# Patient Record
Sex: Female | Born: 1945 | Race: Black or African American | Hispanic: No | Smoking: Former smoker
Health system: Southern US, Community
[De-identification: ages and names within clinical notes are randomized; demographics above are authoritative.]

## PROBLEM LIST (undated history)

## (undated) DIAGNOSIS — Z9981 Dependence on supplemental oxygen: Secondary | ICD-10-CM

## (undated) DIAGNOSIS — R0600 Dyspnea, unspecified: Secondary | ICD-10-CM

## (undated) DIAGNOSIS — F419 Anxiety disorder, unspecified: Secondary | ICD-10-CM

## (undated) DIAGNOSIS — I10 Essential (primary) hypertension: Secondary | ICD-10-CM

## (undated) DIAGNOSIS — E785 Hyperlipidemia, unspecified: Secondary | ICD-10-CM

## (undated) DIAGNOSIS — J189 Pneumonia, unspecified organism: Secondary | ICD-10-CM

## (undated) DIAGNOSIS — M1990 Unspecified osteoarthritis, unspecified site: Secondary | ICD-10-CM

## (undated) HISTORY — DX: Essential (primary) hypertension: I10

## (undated) HISTORY — PX: HERNIA REPAIR: SHX51

## (undated) HISTORY — DX: Dependence on supplemental oxygen: Z99.81

## (undated) HISTORY — DX: Hyperlipidemia, unspecified: E78.5

## (undated) HISTORY — PX: BACK SURGERY: SHX140

## (undated) HISTORY — DX: Pneumonia, unspecified organism: J18.9

## (undated) HISTORY — PX: TUBAL LIGATION: SHX77

## (undated) HISTORY — PX: CATARACT EXTRACTION, BILATERAL: SHX1313

---

## 2010-08-15 ENCOUNTER — Telehealth: Payer: Self-pay

## 2010-08-15 DIAGNOSIS — Z139 Encounter for screening, unspecified: Secondary | ICD-10-CM

## 2010-08-15 NOTE — Telephone Encounter (Signed)
HALF-LYTELY 

## 2010-08-15 NOTE — Telephone Encounter (Signed)
Gastroenterology Pre-Procedure Form  Request Date: 08/07/2010  Requesting Physician: [facility]     PATIENT INFORMATION:  Yolanda Bauer is a 65 y.o., female (DOB=1946/02/05).  PROCEDURE: Procedure(s) requested: colonoscopy Procedure Reason: screening for colon cancer  PATIENT REVIEW QUESTIONS: The patient reports the following:   1. Diabetes Melitis: no 2. Joint replacements in the past 12 months: no 3. Major health problems in the past 3 months: no 4. Has an artificial valve or MVP:no 5. Has been advised in past to take antibiotics in advance of a procedure like teeth cleaning: no}    MEDICATIONS & ALLERGIES:    Patient reports the following regarding taking any blood thinners:   Plavix? no Aspirin?yes  Coumadin?  no  Patient confirms/reports the following medications:  Current Outpatient Prescriptions  Medication Sig Dispense Refill  . Ascorbic Acid (VITAMIN C) 500 MG tablet Take 500 mg by mouth daily.        Marland Kitchen aspirin 81 MG tablet Take 81 mg by mouth daily.        . Coral Calcium 1000 (390 CA) MG TABS Take by mouth.        Marland Kitchen lisinopril-hydrochlorothiazide (PRINZIDE,ZESTORETIC) 10-12.5 MG per tablet Take 1 tablet by mouth daily.        . Multiple Vitamin (MULTIVITAMIN) tablet Take 1 tablet by mouth daily.          Patient confirms/reports the following allergies:  Allergies  Allergen Reactions  . Penicillins Hives    Patient is appropriate to schedule for requested procedure(s): yes  AUTHORIZATION INFORMATION Primary Insurance:   ID #: Group #: Pre-Cert / Auth required: Pre-Cert / Auth #:   Secondary Insurance  ID #: Group #: Pre-Cert / Auth required: Pre-Cert / Auth #:  Orders Placed This Encounter  Procedures  . Endoscopy, colon, diagnostic    Standing Status: Future     Number of Occurrences:      Standing Expiration Date: 08/15/2011    Order Specific Question:  Pre-op diagnosis    Answer:  Screening    Order Specific Question:  Pre-op  visit required?    Answer:  No [0]    SCHEDULE INFORMATION: Procedure has been scheduled as follows:  Date: 08/28/2010, Time: 9:15 AM  Location: [facility] Short Stay  This Gastroenterology Pre-Precedure Form is being routed to the following provider(s) for review: Dr. Jonette Eva

## 2010-08-16 NOTE — Telephone Encounter (Signed)
Rx and instructions mailed to pt.  

## 2010-08-28 ENCOUNTER — Encounter: Payer: Medicaid Other | Admitting: Gastroenterology

## 2010-08-28 ENCOUNTER — Ambulatory Visit (HOSPITAL_COMMUNITY)
Admission: RE | Admit: 2010-08-28 | Discharge: 2010-08-28 | Disposition: A | Discharge status: Home / Self Care | Payer: Medicaid Other | Source: Ambulatory Visit | Attending: Gastroenterology | Admitting: Gastroenterology

## 2010-08-28 DIAGNOSIS — Z1211 Encounter for screening for malignant neoplasm of colon: Secondary | ICD-10-CM

## 2010-08-28 DIAGNOSIS — K5730 Diverticulosis of large intestine without perforation or abscess without bleeding: Secondary | ICD-10-CM

## 2010-09-14 NOTE — Op Note (Signed)
  NAME:  Yolanda Bauer, Yolanda Bauer NO.:  [id number]  MEDICAL RECORD NO.:  [id number]           PATIENT TYPE:  O  LOCATION:  [facility]                          FACILITY:  [facility]  PHYSICIAN:  Jonette Eva, M.D.     DATE OF BIRTH:  24-Jun-1945  DATE OF PROCEDURE:  08/28/2010 DATE OF DISCHARGE:                              OPERATIVE REPORT   REFERRING PROVIDER:  [facility].  PROCEDURE:  Colonoscopy.  SURGEON:  Jonette Eva, MD  INDICATION FOR EXAMINATION:  Yolanda Bauer is a 65 year old female who presents for average risk colon cancer screening.  She did have a second- degree relative that had colon cancer.  FINDINGS: 1. Slightly tortuous colon.  Otherwise, no polyps, masses,     inflammatory changes or AVMs seen. 2. Rare sigmoid colon diverticula. 3. Normal retroflex view of the rectum.  RECOMMENDATIONS: 1. Screening colonoscopy in 10 years. 2. She should follow a high-fiber diet.  She is given a handout on     high-fiber diet and diverticulosis.  MEDICATIONS: 1. Demerol 50 mg IV. 2. Versed 3 mg IV.  PROCEDURE TECHNIQUE:  Physical exam was performed and informed consent was obtained from the patient explaining the benefits, risks, and alternatives to the procedure.  The patient was connected to the monitor and placed in left lateral position.  Continuous oxygen was provided by nasal cannula and IV medicine administered through an indwelling cannula.  After administration of sedation and rectal exam, the patient's rectum was intubated and the scope was advanced under direct visualization to the cecum. Scope was removed slowly by careful examine of the color, texture, anatomy, and integrity of the mucosa on the way out.  The patient was recovered in endoscopy and discharged home in satisfactory condition.     Jonette Eva, M.D.     SF/MEDQ  D:  08/28/2010  T:  08/28/2010  Job:  [id number]  cc:   [facility]  Department  Electronically Signed by Jonette Eva M.D. on 09/14/2010 05:05:51 PM

## 2014-07-29 DIAGNOSIS — Z1231 Encounter for screening mammogram for malignant neoplasm of breast: Secondary | ICD-10-CM | POA: Diagnosis not present

## 2014-08-25 DIAGNOSIS — Z720 Tobacco use: Secondary | ICD-10-CM | POA: Diagnosis not present

## 2014-08-25 DIAGNOSIS — I10 Essential (primary) hypertension: Secondary | ICD-10-CM | POA: Diagnosis not present

## 2014-08-25 DIAGNOSIS — J309 Allergic rhinitis, unspecified: Secondary | ICD-10-CM | POA: Diagnosis not present

## 2014-12-23 DIAGNOSIS — H9203 Otalgia, bilateral: Secondary | ICD-10-CM | POA: Diagnosis not present

## 2014-12-23 DIAGNOSIS — H6123 Impacted cerumen, bilateral: Secondary | ICD-10-CM | POA: Diagnosis not present

## 2015-01-04 DIAGNOSIS — H6123 Impacted cerumen, bilateral: Secondary | ICD-10-CM | POA: Diagnosis not present

## 2015-03-02 DIAGNOSIS — Z23 Encounter for immunization: Secondary | ICD-10-CM | POA: Diagnosis not present

## 2015-03-02 DIAGNOSIS — Z0000 Encounter for general adult medical examination without abnormal findings: Secondary | ICD-10-CM | POA: Diagnosis not present

## 2015-03-02 DIAGNOSIS — Z1389 Encounter for screening for other disorder: Secondary | ICD-10-CM | POA: Diagnosis not present

## 2015-03-16 DIAGNOSIS — Z0100 Encounter for examination of eyes and vision without abnormal findings: Secondary | ICD-10-CM | POA: Diagnosis not present

## 2015-03-16 DIAGNOSIS — H2510 Age-related nuclear cataract, unspecified eye: Secondary | ICD-10-CM | POA: Diagnosis not present

## 2015-03-16 DIAGNOSIS — H5210 Myopia, unspecified eye: Secondary | ICD-10-CM | POA: Diagnosis not present

## 2015-03-16 DIAGNOSIS — H524 Presbyopia: Secondary | ICD-10-CM | POA: Diagnosis not present

## 2015-08-04 DIAGNOSIS — Z1231 Encounter for screening mammogram for malignant neoplasm of breast: Secondary | ICD-10-CM | POA: Diagnosis not present

## 2015-08-17 DIAGNOSIS — J209 Acute bronchitis, unspecified: Secondary | ICD-10-CM | POA: Diagnosis not present

## 2015-08-17 DIAGNOSIS — J069 Acute upper respiratory infection, unspecified: Secondary | ICD-10-CM | POA: Diagnosis not present

## 2015-08-26 DIAGNOSIS — H25813 Combined forms of age-related cataract, bilateral: Secondary | ICD-10-CM | POA: Diagnosis not present

## 2015-12-13 DIAGNOSIS — H02839 Dermatochalasis of unspecified eye, unspecified eyelid: Secondary | ICD-10-CM | POA: Diagnosis not present

## 2015-12-13 DIAGNOSIS — I10 Essential (primary) hypertension: Secondary | ICD-10-CM | POA: Diagnosis not present

## 2015-12-13 DIAGNOSIS — H2513 Age-related nuclear cataract, bilateral: Secondary | ICD-10-CM | POA: Diagnosis not present

## 2015-12-13 DIAGNOSIS — H02833 Dermatochalasis of right eye, unspecified eyelid: Secondary | ICD-10-CM | POA: Diagnosis not present

## 2015-12-13 DIAGNOSIS — H2512 Age-related nuclear cataract, left eye: Secondary | ICD-10-CM | POA: Diagnosis not present

## 2015-12-13 DIAGNOSIS — H2511 Age-related nuclear cataract, right eye: Secondary | ICD-10-CM | POA: Diagnosis not present

## 2016-01-20 DIAGNOSIS — H2511 Age-related nuclear cataract, right eye: Secondary | ICD-10-CM | POA: Diagnosis not present

## 2016-01-20 DIAGNOSIS — H2512 Age-related nuclear cataract, left eye: Secondary | ICD-10-CM | POA: Diagnosis not present

## 2016-01-20 DIAGNOSIS — H25811 Combined forms of age-related cataract, right eye: Secondary | ICD-10-CM | POA: Diagnosis not present

## 2016-02-01 DIAGNOSIS — H25812 Combined forms of age-related cataract, left eye: Secondary | ICD-10-CM | POA: Diagnosis not present

## 2016-02-06 DIAGNOSIS — H2512 Age-related nuclear cataract, left eye: Secondary | ICD-10-CM | POA: Diagnosis not present

## 2016-02-16 DIAGNOSIS — Z23 Encounter for immunization: Secondary | ICD-10-CM | POA: Diagnosis not present

## 2016-03-23 DIAGNOSIS — Z6823 Body mass index (BMI) 23.0-23.9, adult: Secondary | ICD-10-CM | POA: Diagnosis not present

## 2016-03-23 DIAGNOSIS — Z23 Encounter for immunization: Secondary | ICD-10-CM | POA: Diagnosis not present

## 2016-03-23 DIAGNOSIS — Z0000 Encounter for general adult medical examination without abnormal findings: Secondary | ICD-10-CM | POA: Diagnosis not present

## 2016-06-18 DIAGNOSIS — M810 Age-related osteoporosis without current pathological fracture: Secondary | ICD-10-CM | POA: Diagnosis not present

## 2016-06-18 DIAGNOSIS — Z780 Asymptomatic menopausal state: Secondary | ICD-10-CM | POA: Diagnosis not present

## 2016-08-06 DIAGNOSIS — Z1231 Encounter for screening mammogram for malignant neoplasm of breast: Secondary | ICD-10-CM | POA: Diagnosis not present

## 2016-08-08 DIAGNOSIS — Z6823 Body mass index (BMI) 23.0-23.9, adult: Secondary | ICD-10-CM | POA: Diagnosis not present

## 2016-08-08 DIAGNOSIS — R1900 Intra-abdominal and pelvic swelling, mass and lump, unspecified site: Secondary | ICD-10-CM | POA: Diagnosis not present

## 2016-08-08 DIAGNOSIS — J309 Allergic rhinitis, unspecified: Secondary | ICD-10-CM | POA: Diagnosis not present

## 2016-08-16 DIAGNOSIS — D252 Subserosal leiomyoma of uterus: Secondary | ICD-10-CM | POA: Diagnosis not present

## 2016-09-06 DIAGNOSIS — Z6822 Body mass index (BMI) 22.0-22.9, adult: Secondary | ICD-10-CM | POA: Diagnosis not present

## 2016-09-06 DIAGNOSIS — K4090 Unilateral inguinal hernia, without obstruction or gangrene, not specified as recurrent: Secondary | ICD-10-CM | POA: Diagnosis not present

## 2017-02-07 DIAGNOSIS — Z23 Encounter for immunization: Secondary | ICD-10-CM | POA: Diagnosis not present

## 2017-02-08 DIAGNOSIS — Z0100 Encounter for examination of eyes and vision without abnormal findings: Secondary | ICD-10-CM | POA: Diagnosis not present

## 2017-02-08 DIAGNOSIS — I10 Essential (primary) hypertension: Secondary | ICD-10-CM | POA: Diagnosis not present

## 2017-03-27 DIAGNOSIS — J309 Allergic rhinitis, unspecified: Secondary | ICD-10-CM | POA: Diagnosis not present

## 2017-03-27 DIAGNOSIS — F17200 Nicotine dependence, unspecified, uncomplicated: Secondary | ICD-10-CM | POA: Diagnosis not present

## 2017-03-27 DIAGNOSIS — E7800 Pure hypercholesterolemia, unspecified: Secondary | ICD-10-CM | POA: Diagnosis not present

## 2017-03-27 DIAGNOSIS — Z0000 Encounter for general adult medical examination without abnormal findings: Secondary | ICD-10-CM | POA: Diagnosis not present

## 2017-03-27 DIAGNOSIS — Z6823 Body mass index (BMI) 23.0-23.9, adult: Secondary | ICD-10-CM | POA: Diagnosis not present

## 2017-08-09 DIAGNOSIS — R928 Other abnormal and inconclusive findings on diagnostic imaging of breast: Secondary | ICD-10-CM | POA: Diagnosis not present

## 2017-08-09 DIAGNOSIS — Z1231 Encounter for screening mammogram for malignant neoplasm of breast: Secondary | ICD-10-CM | POA: Diagnosis not present

## 2017-08-14 DIAGNOSIS — R922 Inconclusive mammogram: Secondary | ICD-10-CM | POA: Diagnosis not present

## 2017-08-14 DIAGNOSIS — N6001 Solitary cyst of right breast: Secondary | ICD-10-CM | POA: Diagnosis not present

## 2017-08-14 DIAGNOSIS — R928 Other abnormal and inconclusive findings on diagnostic imaging of breast: Secondary | ICD-10-CM | POA: Diagnosis not present

## 2017-09-26 DIAGNOSIS — Z1331 Encounter for screening for depression: Secondary | ICD-10-CM | POA: Diagnosis not present

## 2017-09-26 DIAGNOSIS — Z6822 Body mass index (BMI) 22.0-22.9, adult: Secondary | ICD-10-CM | POA: Diagnosis not present

## 2017-09-26 DIAGNOSIS — M25569 Pain in unspecified knee: Secondary | ICD-10-CM | POA: Diagnosis not present

## 2017-09-26 DIAGNOSIS — I10 Essential (primary) hypertension: Secondary | ICD-10-CM | POA: Diagnosis not present

## 2017-09-26 DIAGNOSIS — E7800 Pure hypercholesterolemia, unspecified: Secondary | ICD-10-CM | POA: Diagnosis not present

## 2017-09-26 DIAGNOSIS — Z1389 Encounter for screening for other disorder: Secondary | ICD-10-CM | POA: Diagnosis not present

## 2018-01-31 DIAGNOSIS — Z23 Encounter for immunization: Secondary | ICD-10-CM | POA: Diagnosis not present

## 2018-02-28 DIAGNOSIS — I10 Essential (primary) hypertension: Secondary | ICD-10-CM | POA: Diagnosis not present

## 2018-02-28 DIAGNOSIS — H524 Presbyopia: Secondary | ICD-10-CM | POA: Diagnosis not present

## 2018-04-04 DIAGNOSIS — H6121 Impacted cerumen, right ear: Secondary | ICD-10-CM | POA: Diagnosis not present

## 2018-04-04 DIAGNOSIS — Z6823 Body mass index (BMI) 23.0-23.9, adult: Secondary | ICD-10-CM | POA: Diagnosis not present

## 2018-04-04 DIAGNOSIS — E7800 Pure hypercholesterolemia, unspecified: Secondary | ICD-10-CM | POA: Diagnosis not present

## 2018-04-04 DIAGNOSIS — Z0000 Encounter for general adult medical examination without abnormal findings: Secondary | ICD-10-CM | POA: Diagnosis not present

## 2018-04-04 DIAGNOSIS — J309 Allergic rhinitis, unspecified: Secondary | ICD-10-CM | POA: Diagnosis not present

## 2020-01-27 ENCOUNTER — Other Ambulatory Visit: Payer: Self-pay

## 2020-01-27 ENCOUNTER — Encounter: Payer: Self-pay | Admitting: Internal Medicine

## 2020-01-27 ENCOUNTER — Ambulatory Visit: Payer: Medicare Other | Admitting: Internal Medicine

## 2020-01-27 DIAGNOSIS — F17210 Nicotine dependence, cigarettes, uncomplicated: Secondary | ICD-10-CM | POA: Diagnosis not present

## 2020-01-27 DIAGNOSIS — R911 Solitary pulmonary nodule: Secondary | ICD-10-CM | POA: Diagnosis not present

## 2020-01-27 DIAGNOSIS — I10 Essential (primary) hypertension: Secondary | ICD-10-CM | POA: Diagnosis not present

## 2020-01-27 MED ORDER — OLMESARTAN MEDOXOMIL-HCTZ 40-25 MG PO TABS: 1.0000 | ORAL_TABLET | Freq: Every day | ORAL | 11 refills | Status: DC

## 2020-01-27 MED ORDER — OLMESARTAN MEDOXOMIL-HCTZ 20-12.5 MG PO TABS: 1.0000 | ORAL_TABLET | Freq: Every day | ORAL | 11 refills | Status: DC

## 2020-01-27 NOTE — Progress Notes (Signed)
Yolanda Bauer, female    DOB: 1946-04-21, 74 y.o.   MRN: [id number]   Brief patient profile:  34 yobf active smoker with hbp on ACEi with noct "wheeze" on prn albuterol helped some referred to pulmonary clinic in [place]  01/27/2020 by Dr Denny Levy PA re SPN  LLL sup segment.     History of Present Illness  01/27/2020  Pulmonary/ 1st office eval/Savino Whisenant  Chief Complaint  Patient presents with   Consult    productive cough with white phlegm in the mornings  Dyspnea:  No steps at her home but Not limited by breathing from desired activities   Cough: some am's / no hemoptysis/ some "wheeze at hs  Sleep: bed is flat/ one pillow  SABA use: none 02 none    No obvious day to day or daytime variability or assoc excess/ purulent sputum or mucus plugs or hemoptysis or cp or chest tightness, subjective wheeze or overt sinus or hb symptoms.   Sleeping  without nocturnal  or early am exacerbation  of respiratory  c/o's or need for noct saba. Also denies any obvious fluctuation of symptoms with weather or environmental changes or other aggravating or alleviating factors except as outlined above   No unusual exposure hx or h/o childhood pna/ asthma or knowledge of premature birth.  Current Allergies, Complete Past Medical History, Past Surgical History, Family History, and Social History were reviewed in [employer] record.  ROS  The following are not active complaints unless bolded Hoarseness, sore throat, dysphagia, dental problems, itching, sneezing,  nasal congestion or discharge of excess mucus or purulent secretions, ear ache,   fever, chills, sweats, unintended wt loss or wt gain, classically pleuritic or exertional cp,  orthopnea pnd or arm/hand swelling  or leg swelling, presyncope, palpitations, abdominal pain, anorexia, nausea, vomiting, diarrhea  or change in bowel habits or change in bladder habits, change in stools or change in urine, dysuria, hematuria,   rash, arthralgias, visual complaints, headache, numbness, weakness or ataxia or problems with walking or coordination,  change in mood or  memory.           Past Medical History:  Diagnosis Date   Hyperlipidemia    Hypertension    Pneumonia     Outpatient Medications Prior to Visit  Medication Sig Dispense Refill   acetaminophen (TYLENOL) 325 MG tablet Take 650 mg by mouth every 6 (six) hours as needed.     Ascorbic Acid (VITAMIN C) 500 MG tablet Take 500 mg by mouth daily.       aspirin 81 MG tablet Take 81 mg by mouth daily.       Coral Calcium 1000 (390 CA) MG TABS Take by mouth.       fluticasone (FLONASE) 50 MCG/ACT nasal spray Place 1 spray into both nostrils daily.     lisinopril-hydrochlorothiazide (ZESTORETIC) 20-12.5 MG tablet Take 1 tablet by mouth daily.     loratadine (CLARITIN) 10 MG tablet Take 10 mg by mouth daily.     metoprolol succinate (TOPROL-XL) 50 MG 24 hr tablet Take 50 mg by mouth daily. Take with or immediately following a meal.     Multiple Vitamin (MULTIVITAMIN) tablet Take 1 tablet by mouth daily.       pravastatin (PRAVACHOL) 40 MG tablet Take 40 mg by mouth daily.     lisinopril-hydrochlorothiazide (PRINZIDE,ZESTORETIC) 10-12.5 MG per tablet Take 1 tablet by mouth daily.       No facility-administered medications prior to  visit.     Objective:     BP (!) 156/80 (BP Location: Left Arm, Cuff Size: Normal)    Pulse 75    Temp (!) 97.1 F (36.2 C) (Other (Comment)) Comment (Src): wrist   Ht 5\' 2"  (1.575 m)    Wt 148 lb (67.1 kg)    SpO2 94% Comment: Room air   BMI 27.07 kg/m   SpO2: 94 % (Room air)   amb pleasant wf nad / mild pseudowheeze    HEENT : pt wearing mask not removed for exam due to covid -19 concerns.    NECK :  without JVD/Nodes/TM/ nl carotid upstrokes bilaterally   LUNGS: no acc muscle use,  Nl contour chest which is clear to A and P bilaterally without cough on insp or exp maneuvers   CV:  RRR  no s3 or murmur  or increase in P2, and no edema   ABD:  soft and nontender with nl inspiratory excursion in the supine position. No bruits or organomegaly appreciated, bowel sounds nl  MS:  Nl gait/ ext warm without deformities, calf tenderness, cyanosis or clubbing No obvious joint restrictions   SKIN: warm and dry without lesions    NEURO:  alert, approp, nl sensorium with  no motor or cerebellar deficits apparent.    I personally reviewed images and agree with radiology impression as follows:   Chest CT November 25 2019  27.2 mm vol derived diameter LLL sup segment       Assessment   Solitary pulmonary nodule on lung CT Active smoker  -  Chest CT November 25 2019  27.2 mm vol derived diameter LLL sup segment  - PET  CT results reviewed with pt >>> good size for  PET to be able to distinguish neoplasm from scar from previous infection in pt who has no baseline study available.  If a stage I lesion may be good candidate for L segmentectomy p excisional bx to support likelihood this is bronchogenic ca in active smoker.  >>>  Discussed in detail all the  indications, usual  risks and alternatives  relative to the benefits with patient who agrees to proceed with w/u as outlined.        Essential hypertension D/c acei 01/27/2020 due to report of noct "wheeze" with pseudowheeze on exam   In the best review of chronic cough to date ( NEJM 2016 375 3716-9678) ,  ACEi are now felt to cause cough in up to  20% of pts which is a 4 fold increase from previous reports and does not include the variety of non-specific complaints we see in pulmonary clinic in pts on ACEi but previously attributed to another dx like  Copd/asthma and  include PNDS, throat and chest congestion, "bronchitis", unexplained dyspnea and noct "strangling" sensations, and hoarseness, but also  atypical /refractory GERD symptoms like dysphagia and "bad heartburn"   The only way I know  to prove this is not an "ACEi Case" is a trial off ACEi x a  minimum of 6 weeks then regroup.   >>> try bencar 20-12.5 one daily and then do pfts preop if PET indicates need for sugery as I doubt the "wheezing" is really asthma or copd but hard to tell with acei on board.      Cigarette smoker Counseled re importance of smoking cessation but did not meet time criteria for separate billing     Medical decision making was a moderate level of complexity in this case  because of  two  Distinct conditions /diagnoses requiring extra time for  H and P, chart review, counseling,    and generating customized AVS unique to this office visit and charting.   Each maintenance medication was reviewed in detail including emphasizing most importantly the difference between maintenance and prns and under what circumstances the prns are to be triggered using an action plan format where appropriate. Please see avs for details which were reviewed in writing by both me and my nurse and patient given a written copy highlighted where appropriate with yellow highlighter for the patient's continued care at home along with an updated version of their medications.  Patient was asked to maintain medication reconciliation by comparing this list to the actual medications being used at home and to contact this office right away if there is a conflict or discrepancy.        Christinia Gully, MD 01/27/2020

## 2020-01-27 NOTE — Patient Instructions (Addendum)
Stop lisinopril and instead take olmesartan 20- 12.5 one daily   The key is to stop smoking completely before smoking completely stops you!  My office will call you to schedule you a PET scan next available and I will call you with the results

## 2020-01-27 NOTE — Assessment & Plan Note (Signed)
Active smoker  -  Chest CT November 25 2019  27.2 mm vol derived diameter LLL sup segment  - PET  CT results reviewed with pt >>> good size for  PET to be able to distinguish neoplasm from scar from previous infection in pt who has no baseline study available.  If a stage I lesion may be good candidate for L segmentectomy p excisional bx to support likelihood this is bronchogenic ca in active smoker.  Discussed in detail all the  indications, usual  risks and alternatives  relative to the benefits with patient who agrees to proceed with w/u as outlined.

## 2020-01-27 NOTE — Assessment & Plan Note (Signed)
D/c acei 01/27/2020 due to report of noct "wheeze" with pseudowheeze on exam   In the best review of chronic cough to date ( NEJM 2016 375 7681-1572) ,  ACEi are now felt to cause cough in up to  20% of pts which is a 4 fold increase from previous reports and does not include the variety of non-specific complaints we see in pulmonary clinic in pts on ACEi but previously attributed to another dx like  Copd/asthma and  include PNDS, throat and chest congestion, "bronchitis", unexplained dyspnea and noct "strangling" sensations, and hoarseness, but also  atypical /refractory GERD symptoms like dysphagia and "bad heartburn"   The only way I know  to prove this is not an "ACEi Case" is a trial off ACEi x a minimum of 6 weeks then regroup.   >>> try bencar 20-12.5 one daily and then do pfts preop if PET indicates need for sugery as I doubt the "wheezing" is really asthma or copd but hard to tell with acei on board.

## 2020-01-27 NOTE — Assessment & Plan Note (Signed)
Counseled re importance of smoking cessation but did not meet time criteria for separate billing     Medical decision making was a moderate level of complexity in this case because of  two  Distinct conditions /diagnoses requiring extra time for  H and P, chart review, counseling,    and generating customized AVS unique to this office visit and charting.   Each maintenance medication was reviewed in detail including emphasizing most importantly the difference between maintenance and prns and under what circumstances the prns are to be triggered using an action plan format where appropriate. Please see avs for details which were reviewed in writing by both me and my nurse and patient given a written copy highlighted where appropriate with yellow highlighter for the patient's continued care at home along with an updated version of their medications.  Patient was asked to maintain medication reconciliation by comparing this list to the actual medications being used at home and to contact this office right away if there is a conflict or discrepancy.

## 2020-02-15 ENCOUNTER — Encounter (HOSPITAL_COMMUNITY)
Admission: RE | Admit: 2020-02-15 | Discharge: 2020-02-15 | Disposition: A | Discharge status: Home / Self Care | Payer: Medicare Other | Source: Ambulatory Visit | Attending: Internal Medicine | Admitting: Internal Medicine

## 2020-02-15 ENCOUNTER — Other Ambulatory Visit: Payer: Self-pay

## 2020-02-15 DIAGNOSIS — R911 Solitary pulmonary nodule: Secondary | ICD-10-CM | POA: Insufficient documentation

## 2020-02-15 MED ORDER — FLUDEOXYGLUCOSE F - 18 (FDG) INJECTION
9.8600 | Freq: Once | INTRAVENOUS | Status: AC | PRN
  Administered 2020-02-15: 9.86 via INTRAVENOUS

## 2020-02-18 ENCOUNTER — Other Ambulatory Visit: Payer: Self-pay | Admitting: Internal Medicine

## 2020-02-18 DIAGNOSIS — F17210 Nicotine dependence, cigarettes, uncomplicated: Secondary | ICD-10-CM

## 2020-02-18 DIAGNOSIS — R911 Solitary pulmonary nodule: Secondary | ICD-10-CM

## 2020-02-18 NOTE — Progress Notes (Signed)
Referral made and PFT ordered at [facility] as we do not have any open slot soon

## 2020-03-07 DIAGNOSIS — C801 Malignant (primary) neoplasm, unspecified: Secondary | ICD-10-CM

## 2020-03-07 DIAGNOSIS — C3490 Malignant neoplasm of unspecified part of unspecified bronchus or lung: Secondary | ICD-10-CM

## 2020-03-07 HISTORY — DX: Malignant (primary) neoplasm, unspecified: C80.1

## 2020-03-07 HISTORY — DX: Malignant neoplasm of unspecified part of unspecified bronchus or lung: C34.90

## 2020-03-11 ENCOUNTER — Other Ambulatory Visit (HOSPITAL_COMMUNITY)
Admission: RE | Admit: 2020-03-11 | Discharge: 2020-03-11 | Disposition: A | Discharge status: Home / Self Care | Payer: Medicare Other | Source: Ambulatory Visit | Attending: Internal Medicine | Admitting: Internal Medicine

## 2020-03-11 ENCOUNTER — Other Ambulatory Visit: Payer: Self-pay

## 2020-03-11 DIAGNOSIS — Z20822 Contact with and (suspected) exposure to covid-19: Secondary | ICD-10-CM | POA: Insufficient documentation

## 2020-03-11 DIAGNOSIS — Z01812 Encounter for preprocedural laboratory examination: Secondary | ICD-10-CM | POA: Insufficient documentation

## 2020-03-11 LAB — SARS CORONAVIRUS 2 (TAT 6-24 HRS): SARS Coronavirus 2: NEGATIVE

## 2020-03-15 ENCOUNTER — Ambulatory Visit (HOSPITAL_COMMUNITY)
Admission: RE | Admit: 2020-03-15 | Discharge: 2020-03-15 | Disposition: A | Discharge status: Home / Self Care | Payer: Medicare Other | Source: Ambulatory Visit | Attending: Internal Medicine | Admitting: Internal Medicine

## 2020-03-15 ENCOUNTER — Other Ambulatory Visit: Payer: Self-pay

## 2020-03-15 DIAGNOSIS — J449 Chronic obstructive pulmonary disease, unspecified: Secondary | ICD-10-CM | POA: Diagnosis not present

## 2020-03-15 DIAGNOSIS — F17210 Nicotine dependence, cigarettes, uncomplicated: Secondary | ICD-10-CM | POA: Diagnosis present

## 2020-03-15 DIAGNOSIS — R911 Solitary pulmonary nodule: Secondary | ICD-10-CM | POA: Insufficient documentation

## 2020-03-15 LAB — PULMONARY FUNCTION TEST
DL/VA % pred: 85 %
DL/VA: 3.59 ml/min/mmHg/L
DLCO unc % pred: 84 %
DLCO unc: 15.22 ml/min/mmHg
FEF 25-75 Post: 0.47 L/sec
FEF 25-75 Pre: 0.4 L/sec
FEF2575-%Change-Post: 18 %
FEF2575-%Pred-Post: 27 %
FEF2575-%Pred-Pre: 23 %
FEV1-%Change-Post: 3 %
FEV1-%Pred-Post: 44 %
FEV1-%Pred-Pre: 43 %
FEV1-Post: 0.9 L
FEV1-Pre: 0.87 L
FEV1FVC-%Change-Post: -10 %
FEV1FVC-%Pred-Pre: 79 %
FEV6-%Change-Post: 14 %
FEV6-%Pred-Post: 62 %
FEV6-%Pred-Pre: 54 %
FEV6-Post: 1.59 L
FEV6-Pre: 1.39 L
FEV6FVC-%Change-Post: -1 %
FEV6FVC-%Pred-Post: 100 %
FEV6FVC-%Pred-Pre: 101 %
FVC-%Change-Post: 15 %
FVC-%Pred-Post: 62 %
FVC-%Pred-Pre: 53 %
FVC-Post: 1.67 L
FVC-Pre: 1.44 L
Post FEV1/FVC ratio: 54 %
Post FEV6/FVC ratio: 95 %
Pre FEV1/FVC ratio: 60 %
Pre FEV6/FVC Ratio: 96 %
RV % pred: 86 %
RV: 1.84 L
TLC % pred: 70 %
TLC: 3.36 L

## 2020-03-15 MED ORDER — ALBUTEROL SULFATE (2.5 MG/3ML) 0.083% IN NEBU
2.5000 mg | INHALATION_SOLUTION | Freq: Once | RESPIRATORY_TRACT | Status: AC
  Administered 2020-03-15: 2.5 mg via RESPIRATORY_TRACT

## 2020-03-17 NOTE — Progress Notes (Signed)
Spoke with pt and notified of results per Dr. Wert. Pt verbalized understanding and denied any questions. 

## 2020-03-22 ENCOUNTER — Institutional Professional Consult (permissible substitution): Payer: Medicare Other | Admitting: Thoracic Surgery (Cardiothoracic Vascular Surgery)

## 2020-03-22 ENCOUNTER — Other Ambulatory Visit: Payer: Self-pay

## 2020-03-22 ENCOUNTER — Other Ambulatory Visit: Payer: Self-pay | Admitting: *Deleted

## 2020-03-22 ENCOUNTER — Telehealth: Payer: Self-pay | Admitting: Internal Medicine

## 2020-03-22 ENCOUNTER — Encounter: Payer: Self-pay | Admitting: Thoracic Surgery (Cardiothoracic Vascular Surgery)

## 2020-03-22 VITALS — BP 177/74 | HR 70 | Resp 18 | Ht 62.0 in | Wt 151.8 lb

## 2020-03-22 DIAGNOSIS — R911 Solitary pulmonary nodule: Secondary | ICD-10-CM

## 2020-03-22 DIAGNOSIS — R918 Other nonspecific abnormal finding of lung field: Secondary | ICD-10-CM

## 2020-03-22 NOTE — H&P (View-Only) (Signed)
PCP is Yolanda Bauer, [place] Referring Provider is Yolanda Rockers, MD  Chief Complaint  Patient presents with  . Lung Lesion    Surgical consult, PET 10/11, CT chest 7/1, PFT 11/9    HPI: [name] sent for consultation regarding a left lower lobe lung mass.  Yolanda Bauer is a 74 year old woman with a history of tobacco abuse (1.5 packs/day x 48 years), COPD, arthritis, pneumonia, hypertension, hyperlipidemia.  She recently had a low-dose screening CT for lung cancer screening.  That showed a 3.5 cm left lower lobe lung mass.  She was referred to Yolanda Bauer.  A PET/CT showed the mass was markedly hypermetabolic with an SUV of 15.  She has referred for consideration for surgical resection.  She feels well.  She is not having any chest pain, pressure, or tightness.  She is short of breath with heavy exertion but can carry her trash long distance at her apartment complex.  She can walk up a flight of stairs.  She has not had any change in appetite or weight loss.  She remains active.  Zubrod Score: At the time of surgery this patient's most appropriate activity status/level should be described as: []     0    Normal activity, no symptoms [x]     1    Restricted in physical strenuous activity but ambulatory, able to do out light work []     2    Ambulatory and capable of self care, unable to do work activities, up and about >50 % of waking hours                              []     3    Only limited self care, in bed greater than 50% of waking hours []     4    Completely disabled, no self care, confined to bed or chair []     5    Moribund  Past Medical History:  Diagnosis Date  . Hyperlipidemia   . Hypertension   . Pneumonia     History reviewed. No pertinent surgical history.  History reviewed. No pertinent family history.  Social History Social History   Tobacco Use  . Smoking status: Current Every Day Smoker    Packs/day: 1.00    Years: 45.00    Pack years: 45.00    Types: Cigarettes   . Smokeless tobacco: Never Used  . Tobacco comment: 3-5 cigarettes per day  Substance Use Topics  . Alcohol use: Not on file  . Drug use: Never    Current Outpatient Medications  Medication Sig Dispense Refill  . acetaminophen (TYLENOL) 325 MG tablet Take 650 mg by mouth every 6 (six) hours as needed.    . Ascorbic Acid (VITAMIN C) 500 MG tablet Take 500 mg by mouth daily.      Marland Kitchen aspirin 81 MG tablet Take 81 mg by mouth daily.      . Coral Calcium 1000 (390 CA) MG TABS Take by mouth.      . fluticasone (FLONASE) 50 MCG/ACT nasal spray Place 1 spray into both nostrils daily.    Marland Kitchen loratadine (CLARITIN) 10 MG tablet Take 10 mg by mouth daily.    . metoprolol succinate (TOPROL-XL) 50 MG 24 hr tablet Take 50 mg by mouth daily. Take with or immediately following a meal.    . Multiple Vitamin (MULTIVITAMIN) tablet Take 1 tablet by mouth daily.      Marland Kitchen  olmesartan-hydrochlorothiazide (BENICAR HCT) 20-12.5 MG tablet Take 1 tablet by mouth daily. 30 tablet 11  . pravastatin (PRAVACHOL) 40 MG tablet Take 40 mg by mouth daily.     No current facility-administered medications for this visit.    Allergies  Allergen Reactions  . Penicillins Hives    Review of Systems  Constitutional: Negative for activity change, fatigue and unexpected weight change.  HENT: Positive for dental problem and hearing loss. Negative for trouble swallowing and voice change.   Eyes: Negative for visual disturbance.  Respiratory: Negative for cough, shortness of breath and wheezing.   Cardiovascular: Negative for chest pain and leg swelling.  Gastrointestinal: Negative for abdominal distention and abdominal pain.  Genitourinary: Positive for frequency. Negative for dysuria.  Musculoskeletal: Positive for arthralgias and joint swelling.  Neurological: Negative for seizures, syncope and weakness.  Hematological: Negative for adenopathy. Does not bruise/bleed easily.  Psychiatric/Behavioral: The patient is  nervous/anxious.   All other systems reviewed and are negative.   BP (!) 177/74 (BP Location: Right Arm, Patient Position: Sitting)   Pulse 70   Resp 18   Ht 5\' 2"  (1.575 m)   Wt 151 lb 12.8 oz (68.9 kg)   SpO2 90% Comment: RA with mask on  BMI 27.76 kg/m  Physical Exam Vitals reviewed.  Constitutional:      General: She is not in acute distress.    Appearance: Normal appearance.  HENT:     Head: Normocephalic and atraumatic.  Eyes:     General: No scleral icterus.    Extraocular Movements: Extraocular movements intact.  Neck:     Vascular: No carotid bruit.  Cardiovascular:     Rate and Rhythm: Normal rate and regular rhythm.     Heart sounds: Normal heart sounds. No murmur heard.  No friction rub. No gallop.   Pulmonary:     Effort: Pulmonary effort is normal. No respiratory distress.     Breath sounds: Normal breath sounds. No wheezing or rales.  Abdominal:     General: There is no distension.     Palpations: Abdomen is soft.  Musculoskeletal:        General: No swelling.     Cervical back: Neck supple.  Lymphadenopathy:     Cervical: No cervical adenopathy.  Skin:    General: Skin is warm and dry.  Neurological:     General: No focal deficit present.     Mental Status: She is alert and oriented to person, place, and time.     Cranial Nerves: No cranial nerve deficit.     Motor: No weakness.     Gait: Gait normal.    Diagnostic Tests: NUCLEAR MEDICINE PET SKULL BASE TO THIGH  TECHNIQUE: 9.9 mCi F-18 FDG was injected intravenously. Full-ring PET imaging was performed from the skull base to thigh after the radiotracer. CT data was obtained and used for attenuation correction and anatomic localization.  Fasting blood glucose: 91 mg/dl  COMPARISON:  Low-dose lung cancer screening CT chest dated 11/06/2019  FINDINGS: Mediastinal blood pool activity: SUV max 2.3  Liver activity: SUV max NA  NECK: No hypermetabolic cervical  lymphadenopathy.  Incidental CT findings: none  CHEST: 3.3 x 3.5 cm spiculated mass in the posterior left lower lobe (series 3/image 89), abutting the chest wall, max SUV 15.3. This corresponds to the patient's suspected primary bronchogenic neoplasm.  No hypermetabolic thoracic lymphadenopathy.  Incidental CT findings: Atherosclerotic calcifications of the aortic arch. Mild coronary atherosclerosis of the left circumflex.  ABDOMEN/PELVIS: 17  mm low-density right adrenal nodule (series 3/image 152), without appreciable hypermetabolism, max SUV 3.7. This favors a benign adrenal adenoma.  No abnormal hypermetabolism in the liver, spleen, pancreas, or left adrenal gland.  No hypermetabolic abdominopelvic lymphadenopathy.  Incidental CT findings: 3.9 cm right lower pole renal cyst (series 3/image 202). Atherosclerotic calcifications the abdominal aorta and branch vessels.  SKELETON: No focal hypermetabolic activity to suggest skeletal metastasis.  Suspected left shoulder joint effusion with mild hypermetabolism, likely degenerative.  Incidental CT findings: Degenerative changes of the visualized thoracolumbar spine.  IMPRESSION: 3.5 cm mass in the posterior left lower lobe, abutting the chest wall, corresponding to the patient's suspected primary bronchogenic neoplasm.  17 mm low-density right adrenal nodule, favoring a benign adrenal adenoma.  No findings suspicious for metastatic disease.   Electronically Signed   By: Julian Hy M.D.   On: 02/16/2020 09:20 I personally reviewed the CT and PET/CT images and concur with the findings noted above.  3.5 cm mass in superior segment of the left lower lobe, markedly hypermetabolic with no evidence of regional or distant metastases.  Pulmonary function testing 03/15/2020 FVC 1.44 (53% of predicted) FEV1 0.87 (43%) FEV1 0.90 (44%) postbronchodilator TLC 3.36 (70%) Residual volume 1.84 (86%) DLCO  15.22 (84%)  Impression: Yolanda Bauer is a 74 year old woman with a history of tobacco abuse, COPD, hypertension, hyperlipidemia, anxiety, and arthritis.  She has smoked about 1.5 packs/day for 48 years.  She has cut back to about 6 cigarettes a day but has not been able to quit altogether.  She recently had a low-dose screening CT which showed a 3.5 cm mass in the superior segment of the left lower lobe.  On PET CT the mass is markedly hypermetabolic with an SUV of 15.  There is no evidence of regional or distant metastasis.  I had a long discussion with Yolanda Bauer and her daughter.  We reviewed the PET images.  We discussed the differential diagnosis which includes neoplasm, infection, and inflammatory masses.  They understand that this is almost certainly a new primary bronchogenic carcinoma and has to be treated that way unless it can be proven otherwise.  Treatment options include surgical resection and radiation.  We talked about the relative advantages and disadvantages of each of those approaches.  Her PFTs are marginal but her functional status is much better than the PFTs would indicate.  She does have some emphysematous changes on CT, but not enough to explain the FEV1 not low.  She says she had some trouble while doing the test showed they may be artificially low.  I will think there is any question she can tolerate a segmentectomy.  She is borderline for lobectomy.  The tumor is in the superior segment and so anatomically is favorable for segmentectomy, even though that is not ideal given the size of the tumor.  I recommended we proceed with robotic left VATS for a superior segmentectomy.  I informed Ms. Daise and her daughter of the general nature of the procedure including the incisions to be used, the need for general anesthesia, the use of drains to postoperatively, the expected hospital stay, and the overall recovery.  They understand that even with complete resection there is no guarantee  of a cure of her lung cancer.  I informed her of the indications, risks, benefits, and alternatives.  They understand the risks include, but not limited to death, MI, DVT, PE, bleeding, possible need for transfusion, infection, prolonged air leak, cardiac arrhythmias, as well as  the possibility of other unforeseeable complications.  She understands accepts the risks and wishes to proceed.  Tobacco abuse-the importance of smoking cessation was emphasized multiple occasions.  She does express interest and desire to quit.  Plan: Robotic left VATS for left lower lobe superior segmentectomy on Monday, 04/11/2020  [name], MD [facility] [phone]

## 2020-03-22 NOTE — Telephone Encounter (Signed)
Called and spoke with Patient.  Patient wanted to review BP medication Dr. Melvyn Novas placed her on at last OV. Patient stated she received her prescription for olmesartan-hydrochlorothiazide from [facility] and wanted to verify strength and dose. Nothing further at this time.  01/27/20-AVS per Dr. Melvyn Novas-   Instructions  Stop lisinopril and instead take olmesartan 20- 12.5 one daily   The key is to stop smoking completely before smoking completely stops you!  My office will call you to schedule you a PET scan next available and I will call you with the results

## 2020-03-22 NOTE — Progress Notes (Signed)
PCP is Denny Levy, [place] Referring Provider is Tanda Rockers, MD  Chief Complaint  Patient presents with  . Lung Lesion    Surgical consult, PET 10/11, CT chest 7/1, PFT 11/9    HPI: Yolanda Bauer sent for consultation regarding a left lower lobe lung mass.  Yolanda Bauer is a 74 year old woman with a history of tobacco abuse (1.5 packs/day x 48 years), COPD, arthritis, pneumonia, hypertension, hyperlipidemia.  She recently had a low-dose screening CT for lung cancer screening.  That showed a 3.5 cm left lower lobe lung mass.  She was referred to Dr. Melvyn Novas.  A PET/CT showed the mass was markedly hypermetabolic with an SUV of 15.  She has referred for consideration for surgical resection.  She feels well.  She is not having any chest pain, pressure, or tightness.  She is short of breath with heavy exertion but can carry her trash long distance at her apartment complex.  She can walk up a flight of stairs.  She has not had any change in appetite or weight loss.  She remains active.  Zubrod Score: At the time of surgery this patient's most appropriate activity status/level should be described as: []     0    Normal activity, no symptoms [x]     1    Restricted in physical strenuous activity but ambulatory, able to do out light work []     2    Ambulatory and capable of self care, unable to do work activities, up and about >50 % of waking hours                              []     3    Only limited self care, in bed greater than 50% of waking hours []     4    Completely disabled, no self care, confined to bed or chair []     5    Moribund  Past Medical History:  Diagnosis Date  . Hyperlipidemia   . Hypertension   . Pneumonia     History reviewed. No pertinent surgical history.  History reviewed. No pertinent family history.  Social History Social History   Tobacco Use  . Smoking status: Current Every Day Smoker    Packs/day: 1.00    Years: 45.00    Pack years: 45.00    Types: Cigarettes   . Smokeless tobacco: Never Used  . Tobacco comment: 3-5 cigarettes per day  Substance Use Topics  . Alcohol use: Not on file  . Drug use: Never    Current Outpatient Medications  Medication Sig Dispense Refill  . acetaminophen (TYLENOL) 325 MG tablet Take 650 mg by mouth every 6 (six) hours as needed.    . Ascorbic Acid (VITAMIN C) 500 MG tablet Take 500 mg by mouth daily.      Marland Kitchen aspirin 81 MG tablet Take 81 mg by mouth daily.      . Coral Calcium 1000 (390 CA) MG TABS Take by mouth.      . fluticasone (FLONASE) 50 MCG/ACT nasal spray Place 1 spray into both nostrils daily.    Marland Kitchen loratadine (CLARITIN) 10 MG tablet Take 10 mg by mouth daily.    . metoprolol succinate (TOPROL-XL) 50 MG 24 hr tablet Take 50 mg by mouth daily. Take with or immediately following a meal.    . Multiple Vitamin (MULTIVITAMIN) tablet Take 1 tablet by mouth daily.      Marland Kitchen  olmesartan-hydrochlorothiazide (BENICAR HCT) 20-12.5 MG tablet Take 1 tablet by mouth daily. 30 tablet 11  . pravastatin (PRAVACHOL) 40 MG tablet Take 40 mg by mouth daily.     No current facility-administered medications for this visit.    Allergies  Allergen Reactions  . Penicillins Hives    Review of Systems  Constitutional: Negative for activity change, fatigue and unexpected weight change.  HENT: Positive for dental problem and hearing loss. Negative for trouble swallowing and voice change.   Eyes: Negative for visual disturbance.  Respiratory: Negative for cough, shortness of breath and wheezing.   Cardiovascular: Negative for chest pain and leg swelling.  Gastrointestinal: Negative for abdominal distention and abdominal pain.  Genitourinary: Positive for frequency. Negative for dysuria.  Musculoskeletal: Positive for arthralgias and joint swelling.  Neurological: Negative for seizures, syncope and weakness.  Hematological: Negative for adenopathy. Does not bruise/bleed easily.  Psychiatric/Behavioral: The patient is  nervous/anxious.   All other systems reviewed and are negative.   BP (!) 177/74 (BP Location: Right Arm, Patient Position: Sitting)   Pulse 70   Resp 18   Ht 5\' 2"  (1.575 m)   Wt 151 lb 12.8 oz (68.9 kg)   SpO2 90% Comment: RA with mask on  BMI 27.76 kg/m  Physical Exam Vitals reviewed.  Constitutional:      General: She is not in acute distress.    Appearance: Normal appearance.  HENT:     Head: Normocephalic and atraumatic.  Eyes:     General: No scleral icterus.    Extraocular Movements: Extraocular movements intact.  Neck:     Vascular: No carotid bruit.  Cardiovascular:     Rate and Rhythm: Normal rate and regular rhythm.     Heart sounds: Normal heart sounds. No murmur heard.  No friction rub. No gallop.   Pulmonary:     Effort: Pulmonary effort is normal. No respiratory distress.     Breath sounds: Normal breath sounds. No wheezing or rales.  Abdominal:     General: There is no distension.     Palpations: Abdomen is soft.  Musculoskeletal:        General: No swelling.     Cervical back: Neck supple.  Lymphadenopathy:     Cervical: No cervical adenopathy.  Skin:    General: Skin is warm and dry.  Neurological:     General: No focal deficit present.     Mental Status: She is alert and oriented to person, place, and time.     Cranial Nerves: No cranial nerve deficit.     Motor: No weakness.     Gait: Gait normal.    Diagnostic Tests: NUCLEAR MEDICINE PET SKULL BASE TO THIGH  TECHNIQUE: 9.9 mCi F-18 FDG was injected intravenously. Full-ring PET imaging was performed from the skull base to thigh after the radiotracer. CT data was obtained and used for attenuation correction and anatomic localization.  Fasting blood glucose: 91 mg/dl  COMPARISON:  Low-dose lung cancer screening CT chest dated 11/06/2019  FINDINGS: Mediastinal blood pool activity: SUV max 2.3  Liver activity: SUV max NA  NECK: No hypermetabolic cervical  lymphadenopathy.  Incidental CT findings: none  CHEST: 3.3 x 3.5 cm spiculated mass in the posterior left lower lobe (series 3/image 89), abutting the chest wall, max SUV 15.3. This corresponds to the patient's suspected primary bronchogenic neoplasm.  No hypermetabolic thoracic lymphadenopathy.  Incidental CT findings: Atherosclerotic calcifications of the aortic arch. Mild coronary atherosclerosis of the left circumflex.  ABDOMEN/PELVIS: 17  mm low-density right adrenal nodule (series 3/image 152), without appreciable hypermetabolism, max SUV 3.7. This favors a benign adrenal adenoma.  No abnormal hypermetabolism in the liver, spleen, pancreas, or left adrenal gland.  No hypermetabolic abdominopelvic lymphadenopathy.  Incidental CT findings: 3.9 cm right lower pole renal cyst (series 3/image 202). Atherosclerotic calcifications the abdominal aorta and branch vessels.  SKELETON: No focal hypermetabolic activity to suggest skeletal metastasis.  Suspected left shoulder joint effusion with mild hypermetabolism, likely degenerative.  Incidental CT findings: Degenerative changes of the visualized thoracolumbar spine.  IMPRESSION: 3.5 cm mass in the posterior left lower lobe, abutting the chest wall, corresponding to the patient's suspected primary bronchogenic neoplasm.  17 mm low-density right adrenal nodule, favoring a benign adrenal adenoma.  No findings suspicious for metastatic disease.   Electronically Signed   By: Julian Hy M.D.   On: 02/16/2020 09:20 I personally reviewed the CT and PET/CT images and concur with the findings noted above.  3.5 cm mass in superior segment of the left lower lobe, markedly hypermetabolic with no evidence of regional or distant metastases.  Pulmonary function testing 03/15/2020 FVC 1.44 (53% of predicted) FEV1 0.87 (43%) FEV1 0.90 (44%) postbronchodilator TLC 3.36 (70%) Residual volume 1.84 (86%) DLCO  15.22 (84%)  Impression: Kery Haltiwanger is a 74 year old woman with a history of tobacco abuse, COPD, hypertension, hyperlipidemia, anxiety, and arthritis.  She has smoked about 1.5 packs/day for 48 years.  She has cut back to about 6 cigarettes a day but has not been able to quit altogether.  She recently had a low-dose screening CT which showed a 3.5 cm mass in the superior segment of the left lower lobe.  On PET CT the mass is markedly hypermetabolic with an SUV of 15.  There is no evidence of regional or distant metastasis.  I had a long discussion with Mrs. Avalos and her daughter.  We reviewed the PET images.  We discussed the differential diagnosis which includes neoplasm, infection, and inflammatory masses.  They understand that this is almost certainly a new primary bronchogenic carcinoma and has to be treated that way unless it can be proven otherwise.  Treatment options include surgical resection and radiation.  We talked about the relative advantages and disadvantages of each of those approaches.  Her PFTs are marginal but her functional status is much better than the PFTs would indicate.  She does have some emphysematous changes on CT, but not enough to explain the FEV1 not low.  She says she had some trouble while doing the test showed they may be artificially low.  I will think there is any question she can tolerate a segmentectomy.  She is borderline for lobectomy.  The tumor is in the superior segment and so anatomically is favorable for segmentectomy, even though that is not ideal given the size of the tumor.  I recommended we proceed with robotic left VATS for a superior segmentectomy.  I informed Ms. Bundrick and her daughter of the general nature of the procedure including the incisions to be used, the need for general anesthesia, the use of drains to postoperatively, the expected hospital stay, and the overall recovery.  They understand that even with complete resection there is no guarantee  of a cure of her lung cancer.  I informed her of the indications, risks, benefits, and alternatives.  They understand the risks include, but not limited to death, MI, DVT, PE, bleeding, possible need for transfusion, infection, prolonged air leak, cardiac arrhythmias, as well as  the possibility of other unforeseeable complications.  She understands accepts the risks and wishes to proceed.  Tobacco abuse-the importance of smoking cessation was emphasized multiple occasions.  She does express interest and desire to quit.  Plan: Robotic left VATS for left lower lobe superior segmentectomy on Monday, 04/11/2020  [name], MD [facility] [phone]

## 2020-03-23 ENCOUNTER — Encounter: Payer: Self-pay | Admitting: *Deleted

## 2020-04-06 NOTE — Progress Notes (Signed)
[employer], [place], [place], [place] [address] Phone: [phone] Fax: [place] [address], [place] [address] Phone: [phone] Fax: [phone]      Your procedure is scheduled on 04/11/2020.  Report to [facility] Main Entrance "A" at 09:45 A.M., and check in at the Admitting office.  Call this number if you have problems the morning of surgery:  [phone]  Call [phone] if you have any questions prior to your surgery date Monday-Friday 8am-4pm    Remember:  Do not eat or drink after midnight the night before your surgery    Take these medicines the morning of surgery with A SIP OF WATER  fluticasone (FLONASE) loratadine (CLARITIN) metoprolol succinate (TOPROL-XL)              Take the following IF NEEDED the morning of surgery.  acetaminophen (TYLENOL)   As of today, STOP taking any Aspirin (unless otherwise instructed by your surgeon) Aleve, Naproxen, Ibuprofen, Motrin, Advil, Goody's, BC's, all herbal medications, fish oil, and all vitamins.                      Do not wear jewelry, make up, or nail polish            Do not wear lotions, powders, perfumes/colognes, or deodorant.            Do not shave 48 hours prior to surgery.              Do not bring valuables to the hospital.            [facility] is not responsible for any belongings or valuables.  Do NOT Smoke (Tobacco/Vaping) or drink Alcohol 24 hours prior to your procedure If you use a CPAP at night, you may bring all equipment for your overnight stay.   Contacts, glasses, dentures or bridgework may not be worn into surgery.      For patients admitted to the hospital, discharge time will be determined by your treatment team.   Patients discharged the day of surgery will not be allowed to drive home, and someone needs to stay with them for 24  hours.    Special instructions:   [facility]- Preparing For Surgery  Before surgery, you can play an important role. Because skin is not sterile, your skin needs to be as free of germs as possible. You can reduce the number of germs on your skin by washing with CHG (chlorahexidine gluconate) Soap before surgery.  CHG is an antiseptic cleaner which kills germs and bonds with the skin to continue killing germs even after washing.    Oral Hygiene is also important to reduce your risk of infection.  Remember - BRUSH YOUR TEETH THE MORNING OF SURGERY WITH YOUR REGULAR TOOTHPASTE  Please do not use if you have an allergy to CHG or antibacterial soaps. If your skin becomes reddened/irritated stop using the CHG.  Do not shave (including legs and underarms) for at least 48 hours prior to first CHG shower. It is OK to shave your face.  Please follow these instructions carefully.   1. Shower the NIGHT BEFORE SURGERY and the MORNING OF SURGERY with CHG Soap.   2. If you chose to wash your hair, wash your hair first as usual with your normal shampoo.  3. After you  shampoo, rinse your hair and body thoroughly to remove the shampoo.  4. Use CHG as you would any other liquid soap. You can apply CHG directly to the skin and wash gently with a scrungie or a clean washcloth.   5. Apply the CHG Soap to your body ONLY FROM THE NECK DOWN.  Do not use on open wounds or open sores. Avoid contact with your eyes, ears, mouth and genitals (private parts). Wash Face and genitals (private parts)  with your normal soap.   6. Wash thoroughly, paying special attention to the area where your surgery will be performed.  7. Thoroughly rinse your body with warm water from the neck down.  8. DO NOT shower/wash with your normal soap after using and rinsing off the CHG Soap.  9. Pat yourself dry with a CLEAN TOWEL.  10. Wear CLEAN PAJAMAS to bed the night before surgery  11. Place CLEAN SHEETS on your bed the night of  your first shower and DO NOT SLEEP WITH PETS.   Day of Surgery: Shower Wear Clean/Comfortable clothing the morning of surgery Do not apply any deodorants/lotions.   Remember to brush your teeth WITH YOUR REGULAR TOOTHPASTE.   Please read over the following fact sheets that you were given.

## 2020-04-07 ENCOUNTER — Encounter (HOSPITAL_COMMUNITY)
Admission: RE | Admit: 2020-04-07 | Discharge: 2020-04-07 | Disposition: A | Discharge status: Home / Self Care | Payer: Medicare Other | Source: Ambulatory Visit | Attending: Thoracic Surgery (Cardiothoracic Vascular Surgery) | Admitting: Thoracic Surgery (Cardiothoracic Vascular Surgery)

## 2020-04-07 ENCOUNTER — Other Ambulatory Visit: Payer: Self-pay

## 2020-04-07 ENCOUNTER — Encounter (HOSPITAL_COMMUNITY): Payer: Self-pay

## 2020-04-07 ENCOUNTER — Other Ambulatory Visit (HOSPITAL_COMMUNITY)
Admission: RE | Admit: 2020-04-07 | Discharge: 2020-04-07 | Disposition: A | Discharge status: Home / Self Care | Payer: Medicare Other | Source: Ambulatory Visit | Attending: Thoracic Surgery (Cardiothoracic Vascular Surgery) | Admitting: Thoracic Surgery (Cardiothoracic Vascular Surgery)

## 2020-04-07 DIAGNOSIS — Z20822 Contact with and (suspected) exposure to covid-19: Secondary | ICD-10-CM | POA: Insufficient documentation

## 2020-04-07 DIAGNOSIS — Z01818 Encounter for other preprocedural examination: Secondary | ICD-10-CM | POA: Diagnosis not present

## 2020-04-07 DIAGNOSIS — R918 Other nonspecific abnormal finding of lung field: Secondary | ICD-10-CM

## 2020-04-07 HISTORY — DX: Dyspnea, unspecified: R06.00

## 2020-04-07 HISTORY — DX: Anxiety disorder, unspecified: F41.9

## 2020-04-07 HISTORY — DX: Unspecified osteoarthritis, unspecified site: M19.90

## 2020-04-07 LAB — BLOOD GAS, ARTERIAL
Acid-Base Excess: 5 mmol/L — ABNORMAL HIGH (ref 0.0–2.0)
Bicarbonate: 29.9 mmol/L — ABNORMAL HIGH (ref 20.0–28.0)
FIO2: 21
O2 Saturation: 93.7 %
Patient temperature: 37
pCO2 arterial: 51.3 mmHg — ABNORMAL HIGH (ref 32.0–48.0)
pH, Arterial: 7.383 (ref 7.350–7.450)
pO2, Arterial: 72.9 mmHg — ABNORMAL LOW (ref 83.0–108.0)

## 2020-04-07 LAB — URINALYSIS, ROUTINE W REFLEX MICROSCOPIC
Bilirubin Urine: NEGATIVE
Glucose, UA: NEGATIVE mg/dL
Ketones, ur: NEGATIVE mg/dL
Leukocytes,Ua: NEGATIVE
Nitrite: NEGATIVE
Protein, ur: NEGATIVE mg/dL
Specific Gravity, Urine: 1.012 (ref 1.005–1.030)
pH: 5 (ref 5.0–8.0)

## 2020-04-07 LAB — SURGICAL PCR SCREEN
MRSA, PCR: NEGATIVE
Staphylococcus aureus: NEGATIVE

## 2020-04-07 LAB — COMPREHENSIVE METABOLIC PANEL
ALT: 12 U/L (ref 0–44)
AST: 23 U/L (ref 15–41)
Albumin: 3.5 g/dL (ref 3.5–5.0)
Alkaline Phosphatase: 61 U/L (ref 38–126)
Anion gap: 9 (ref 5–15)
BUN: 10 mg/dL (ref 8–23)
CO2: 26 mmol/L (ref 22–32)
Calcium: 9.3 mg/dL (ref 8.9–10.3)
Chloride: 102 mmol/L (ref 98–111)
Creatinine, Ser: 0.7 mg/dL (ref 0.44–1.00)
GFR, Estimated: >60 mL/min (ref >60)
Glucose, Bld: 178 mg/dL — ABNORMAL HIGH (ref 70–99)
Potassium: 4 mmol/L (ref 3.5–5.1)
Sodium: 137 mmol/L (ref 135–145)
Total Bilirubin: 0.5 mg/dL (ref 0.3–1.2)
Total Protein: 7.2 g/dL (ref 6.5–8.1)

## 2020-04-07 LAB — TYPE AND SCREEN
ABO/RH(D): AB POS
Antibody Screen: NEGATIVE

## 2020-04-07 LAB — CBC
HCT: 42.7 % (ref 36.0–46.0)
Hemoglobin: 13.3 g/dL (ref 12.0–15.0)
MCH: 27.6 pg (ref 26.0–34.0)
MCHC: 31.1 g/dL (ref 30.0–36.0)
MCV: 88.6 fL (ref 80.0–100.0)
Platelets: 324 10*3/uL (ref 150–400)
RBC: 4.82 MIL/uL (ref 3.87–5.11)
RDW: 15.6 % — ABNORMAL HIGH (ref 11.5–15.5)
WBC: 6 10*3/uL (ref 4.0–10.5)
nRBC: 0 % (ref 0.0–0.2)

## 2020-04-07 LAB — SARS CORONAVIRUS 2 (TAT 6-24 HRS): SARS Coronavirus 2: NEGATIVE

## 2020-04-07 NOTE — Progress Notes (Addendum)
PCP - [place] Cardiologist - NA    hest x-ray - DOS EKG - TODAY Stress Test - NA ECHO -NA  Cardiac Cath - NA      - COVID TEST- DID TODAY   Anesthesia review: HXT HTN       REFERRED BY DR BBUY  Patient denies shortness of breath, fever, cough and chest pain at PAT appointment   All instructions explained to the patient, with a verbal understanding of the material. Patient agrees to go over the instructions while at home for a better understanding. Patient also instructed to self quarantine after being tested for COVID-19. The opportunity to ask questions was provided.

## 2020-04-11 ENCOUNTER — Encounter (HOSPITAL_COMMUNITY)
Admission: RE | Disposition: A | Discharge status: Home / Self Care | Payer: Self-pay | Source: Home / Self Care | Attending: Thoracic Surgery (Cardiothoracic Vascular Surgery)

## 2020-04-11 ENCOUNTER — Inpatient Hospital Stay (HOSPITAL_COMMUNITY): Payer: Medicare Other | Admitting: Anesthesiology

## 2020-04-11 ENCOUNTER — Inpatient Hospital Stay (HOSPITAL_COMMUNITY)
Admission: RE | Admit: 2020-04-11 | Discharge: 2020-04-14 | DRG: 165 | Disposition: A | Discharge status: Home / Self Care | Payer: Medicare Other | Attending: Thoracic Surgery (Cardiothoracic Vascular Surgery) | Admitting: Thoracic Surgery (Cardiothoracic Vascular Surgery)

## 2020-04-11 ENCOUNTER — Other Ambulatory Visit: Payer: Self-pay

## 2020-04-11 ENCOUNTER — Inpatient Hospital Stay (HOSPITAL_COMMUNITY): Payer: Medicare Other

## 2020-04-11 ENCOUNTER — Encounter (HOSPITAL_COMMUNITY): Payer: Self-pay | Admitting: Thoracic Surgery (Cardiothoracic Vascular Surgery)

## 2020-04-11 ENCOUNTER — Inpatient Hospital Stay (HOSPITAL_COMMUNITY): Payer: Medicare Other | Admitting: Physician Assistant

## 2020-04-11 DIAGNOSIS — E278 Other specified disorders of adrenal gland: Secondary | ICD-10-CM | POA: Diagnosis present

## 2020-04-11 DIAGNOSIS — C3432 Malignant neoplasm of lower lobe, left bronchus or lung: Principal | ICD-10-CM | POA: Diagnosis present

## 2020-04-11 DIAGNOSIS — R918 Other nonspecific abnormal finding of lung field: Secondary | ICD-10-CM | POA: Diagnosis present

## 2020-04-11 DIAGNOSIS — F17210 Nicotine dependence, cigarettes, uncomplicated: Secondary | ICD-10-CM | POA: Diagnosis present

## 2020-04-11 DIAGNOSIS — Z7982 Long term (current) use of aspirin: Secondary | ICD-10-CM

## 2020-04-11 DIAGNOSIS — R222 Localized swelling, mass and lump, trunk: Secondary | ICD-10-CM | POA: Diagnosis not present

## 2020-04-11 DIAGNOSIS — Z716 Tobacco abuse counseling: Secondary | ICD-10-CM

## 2020-04-11 DIAGNOSIS — Z79899 Other long term (current) drug therapy: Secondary | ICD-10-CM | POA: Diagnosis not present

## 2020-04-11 DIAGNOSIS — J449 Chronic obstructive pulmonary disease, unspecified: Secondary | ICD-10-CM | POA: Diagnosis present

## 2020-04-11 DIAGNOSIS — I2510 Atherosclerotic heart disease of native coronary artery without angina pectoris: Secondary | ICD-10-CM | POA: Diagnosis present

## 2020-04-11 DIAGNOSIS — F419 Anxiety disorder, unspecified: Secondary | ICD-10-CM | POA: Diagnosis present

## 2020-04-11 DIAGNOSIS — Z4682 Encounter for fitting and adjustment of non-vascular catheter: Secondary | ICD-10-CM

## 2020-04-11 DIAGNOSIS — Z01818 Encounter for other preprocedural examination: Secondary | ICD-10-CM

## 2020-04-11 DIAGNOSIS — E785 Hyperlipidemia, unspecified: Secondary | ICD-10-CM | POA: Diagnosis present

## 2020-04-11 DIAGNOSIS — M1990 Unspecified osteoarthritis, unspecified site: Secondary | ICD-10-CM | POA: Diagnosis present

## 2020-04-11 DIAGNOSIS — Z880 Allergy status to penicillin: Secondary | ICD-10-CM | POA: Diagnosis not present

## 2020-04-11 DIAGNOSIS — Z7951 Long term (current) use of inhaled steroids: Secondary | ICD-10-CM

## 2020-04-11 DIAGNOSIS — N281 Cyst of kidney, acquired: Secondary | ICD-10-CM | POA: Diagnosis present

## 2020-04-11 DIAGNOSIS — Z902 Acquired absence of lung [part of]: Secondary | ICD-10-CM

## 2020-04-11 DIAGNOSIS — I10 Essential (primary) hypertension: Secondary | ICD-10-CM | POA: Diagnosis present

## 2020-04-11 HISTORY — PX: INTERCOSTAL NERVE BLOCK: SHX5021

## 2020-04-11 HISTORY — PX: NODE DISSECTION: SHX5269

## 2020-04-11 HISTORY — PX: XI ROBOTIC ASSISTED THORACOSCOPY- SEGMENTECTOMY: SHX6881

## 2020-04-11 LAB — PROTIME-INR
INR: 1.2 (ref 0.8–1.2)
Prothrombin Time: 14.4 seconds (ref 11.4–15.2)

## 2020-04-11 LAB — ABO/RH: ABO/RH(D): AB POS

## 2020-04-11 SURGERY — RESECTION, LUNG, SEGMENTAL, ROBOT-ASSISTED
Anesthesia: General | Site: Chest | Laterality: Left

## 2020-04-11 MED ORDER — ROCURONIUM BROMIDE 10 MG/ML (PF) SYRINGE
PREFILLED_SYRINGE | INTRAVENOUS | Status: DC | PRN
  Administered 2020-04-11: 70 mg via INTRAVENOUS
  Administered 2020-04-11: 10 mg via INTRAVENOUS
  Administered 2020-04-11: 20 mg via INTRAVENOUS

## 2020-04-11 MED ORDER — ONDANSETRON HCL 4 MG/2ML IJ SOLN
INTRAMUSCULAR | Status: DC | PRN
  Administered 2020-04-11: 4 mg via INTRAVENOUS

## 2020-04-11 MED ORDER — FENTANYL CITRATE (PF) 100 MCG/2ML IJ SOLN
INTRAMUSCULAR | Status: AC
  Filled 2020-04-11: qty 2

## 2020-04-11 MED ORDER — ACETAMINOPHEN 160 MG/5ML PO SOLN: 1000.0000 mg | Freq: Four times a day (QID) | ORAL | Status: DC

## 2020-04-11 MED ORDER — ALBUTEROL SULFATE (2.5 MG/3ML) 0.083% IN NEBU
2.5000 mg | INHALATION_SOLUTION | RESPIRATORY_TRACT | Status: DC
  Administered 2020-04-11: 2.5 mg via RESPIRATORY_TRACT
  Filled 2020-04-11: qty 3

## 2020-04-11 MED ORDER — PRAVASTATIN SODIUM 40 MG PO TABS
40.0000 mg | ORAL_TABLET | Freq: Every day | ORAL | Status: DC
  Administered 2020-04-11 – 2020-04-13 (×3): 40 mg via ORAL
  Filled 2020-04-11 (×3): qty 1

## 2020-04-11 MED ORDER — FLUTICASONE PROPIONATE 50 MCG/ACT NA SUSP: 1.0000 | Freq: Every day | NASAL | Status: DC | PRN

## 2020-04-11 MED ORDER — DEXAMETHASONE SODIUM PHOSPHATE 10 MG/ML IJ SOLN
INTRAMUSCULAR | Status: DC | PRN
  Administered 2020-04-11: 4 mg via INTRAVENOUS

## 2020-04-11 MED ORDER — ALBUTEROL SULFATE HFA 108 (90 BASE) MCG/ACT IN AERS
INHALATION_SPRAY | RESPIRATORY_TRACT | Status: DC | PRN
  Administered 2020-04-11: 4 via RESPIRATORY_TRACT

## 2020-04-11 MED ORDER — SUGAMMADEX SODIUM 200 MG/2ML IV SOLN
INTRAVENOUS | Status: DC | PRN
  Administered 2020-04-11: 200 mg via INTRAVENOUS

## 2020-04-11 MED ORDER — HYDRALAZINE HCL 20 MG/ML IJ SOLN: 10.0000 mg | Freq: Once | INTRAMUSCULAR | Status: AC

## 2020-04-11 MED ORDER — INDOCYANINE GREEN 25 MG IV SOLR
INTRAVENOUS | Status: DC | PRN
  Administered 2020-04-11: 25 mg via INTRAVENOUS

## 2020-04-11 MED ORDER — LACTATED RINGERS IV SOLN: INTRAVENOUS | Status: DC | PRN

## 2020-04-11 MED ORDER — ONDANSETRON HCL 4 MG/2ML IJ SOLN
INTRAMUSCULAR | Status: AC
  Filled 2020-04-11: qty 2

## 2020-04-11 MED ORDER — ALBUTEROL SULFATE (2.5 MG/3ML) 0.083% IN NEBU
2.5000 mg | INHALATION_SOLUTION | Freq: Once | RESPIRATORY_TRACT | Status: DC
  Filled 2020-04-11: qty 3

## 2020-04-11 MED ORDER — FENTANYL CITRATE (PF) 100 MCG/2ML IJ SOLN
25.0000 ug | INTRAMUSCULAR | Status: DC | PRN
  Administered 2020-04-11 (×2): 50 ug via INTRAVENOUS

## 2020-04-11 MED ORDER — OLMESARTAN MEDOXOMIL-HCTZ 20-12.5 MG PO TABS: 1.0000 | ORAL_TABLET | Freq: Every day | ORAL | Status: DC

## 2020-04-11 MED ORDER — ONDANSETRON HCL 4 MG/2ML IJ SOLN: 4.0000 mg | Freq: Four times a day (QID) | INTRAMUSCULAR | Status: DC | PRN

## 2020-04-11 MED ORDER — HEMOSTATIC AGENTS (NO CHARGE) OPTIME
TOPICAL | Status: DC | PRN
  Administered 2020-04-11 (×2): 1 via TOPICAL

## 2020-04-11 MED ORDER — CHLORHEXIDINE GLUCONATE 0.12 % MT SOLN
OROMUCOSAL | Status: AC
  Administered 2020-04-11: 15 mL
  Filled 2020-04-11: qty 15

## 2020-04-11 MED ORDER — 0.9 % SODIUM CHLORIDE (POUR BTL) OPTIME
TOPICAL | Status: DC | PRN
  Administered 2020-04-11: 2000 mL

## 2020-04-11 MED ORDER — PHENYLEPHRINE HCL-NACL 10-0.9 MG/250ML-% IV SOLN
INTRAVENOUS | Status: DC | PRN
  Administered 2020-04-11: 25 ug/min via INTRAVENOUS

## 2020-04-11 MED ORDER — ACETAMINOPHEN 500 MG PO TABS
1000.0000 mg | ORAL_TABLET | Freq: Four times a day (QID) | ORAL | Status: DC
  Administered 2020-04-12 – 2020-04-14 (×10): 1000 mg via ORAL
  Filled 2020-04-11 (×10): qty 2

## 2020-04-11 MED ORDER — SENNOSIDES-DOCUSATE SODIUM 8.6-50 MG PO TABS
1.0000 | ORAL_TABLET | Freq: Every day | ORAL | Status: DC
  Administered 2020-04-11 – 2020-04-13 (×3): 1 via ORAL
  Filled 2020-04-11 (×3): qty 1

## 2020-04-11 MED ORDER — VANCOMYCIN HCL IN DEXTROSE 1-5 GM/200ML-% IV SOLN
1000.0000 mg | Freq: Two times a day (BID) | INTRAVENOUS | Status: AC
  Administered 2020-04-11: 1000 mg via INTRAVENOUS
  Filled 2020-04-11: qty 200

## 2020-04-11 MED ORDER — GLYCOPYRROLATE PF 0.2 MG/ML IJ SOSY
PREFILLED_SYRINGE | INTRAMUSCULAR | Status: AC
  Filled 2020-04-11: qty 2

## 2020-04-11 MED ORDER — SCOPOLAMINE 1 MG/3DAYS TD PT72
1.0000 | MEDICATED_PATCH | TRANSDERMAL | Status: DC
  Administered 2020-04-11: 1.5 mg via TRANSDERMAL
  Filled 2020-04-11: qty 1

## 2020-04-11 MED ORDER — KETOROLAC TROMETHAMINE 30 MG/ML IJ SOLN
INTRAMUSCULAR | Status: AC
  Administered 2020-04-11: 15 mg
  Filled 2020-04-11: qty 1

## 2020-04-11 MED ORDER — VANCOMYCIN HCL IN DEXTROSE 1-5 GM/200ML-% IV SOLN
1000.0000 mg | INTRAVENOUS | Status: AC
  Administered 2020-04-11: 1000 mg via INTRAVENOUS
  Filled 2020-04-11: qty 200

## 2020-04-11 MED ORDER — DEXAMETHASONE SODIUM PHOSPHATE 10 MG/ML IJ SOLN
INTRAMUSCULAR | Status: AC
  Filled 2020-04-11: qty 1

## 2020-04-11 MED ORDER — LORATADINE 10 MG PO TABS
10.0000 mg | ORAL_TABLET | Freq: Every day | ORAL | Status: DC
  Administered 2020-04-12 – 2020-04-14 (×3): 10 mg via ORAL
  Filled 2020-04-11 (×3): qty 1

## 2020-04-11 MED ORDER — LABETALOL HCL 5 MG/ML IV SOLN
10.0000 mg | INTRAVENOUS | Status: DC | PRN
  Administered 2020-04-11: 10 mg via INTRAVENOUS

## 2020-04-11 MED ORDER — ROCURONIUM BROMIDE 10 MG/ML (PF) SYRINGE
PREFILLED_SYRINGE | INTRAVENOUS | Status: AC
  Filled 2020-04-11: qty 10

## 2020-04-11 MED ORDER — BUPIVACAINE LIPOSOME 1.3 % IJ SUSP
20.0000 mL | INTRAMUSCULAR | Status: DC
  Filled 2020-04-11: qty 20

## 2020-04-11 MED ORDER — DEXTROSE-NACL 5-0.9 % IV SOLN: INTRAVENOUS | Status: DC

## 2020-04-11 MED ORDER — METOPROLOL SUCCINATE ER 50 MG PO TB24
50.0000 mg | ORAL_TABLET | Freq: Every day | ORAL | Status: DC
  Administered 2020-04-12 – 2020-04-14 (×3): 50 mg via ORAL
  Filled 2020-04-11 (×3): qty 1

## 2020-04-11 MED ORDER — FENTANYL CITRATE (PF) 250 MCG/5ML IJ SOLN
INTRAMUSCULAR | Status: DC | PRN
  Administered 2020-04-11: 50 ug via INTRAVENOUS
  Administered 2020-04-11: 100 ug via INTRAVENOUS

## 2020-04-11 MED ORDER — OXYCODONE HCL 5 MG PO TABS
5.0000 mg | ORAL_TABLET | ORAL | Status: DC | PRN
  Administered 2020-04-11: 5 mg via ORAL
  Administered 2020-04-12: 10 mg via ORAL
  Filled 2020-04-11: qty 2
  Filled 2020-04-11: qty 1

## 2020-04-11 MED ORDER — PROPOFOL 10 MG/ML IV BOLUS
INTRAVENOUS | Status: DC | PRN
  Administered 2020-04-11: 100 mg via INTRAVENOUS

## 2020-04-11 MED ORDER — HYDROCHLOROTHIAZIDE 12.5 MG PO CAPS
12.5000 mg | ORAL_CAPSULE | Freq: Every day | ORAL | Status: DC
  Administered 2020-04-11 – 2020-04-14 (×4): 12.5 mg via ORAL
  Filled 2020-04-11 (×4): qty 1

## 2020-04-11 MED ORDER — ACETAMINOPHEN 500 MG PO TABS
1000.0000 mg | ORAL_TABLET | Freq: Once | ORAL | Status: AC
  Administered 2020-04-11: 1000 mg via ORAL
  Filled 2020-04-11: qty 2

## 2020-04-11 MED ORDER — SODIUM CHLORIDE FLUSH 0.9 % IV SOLN
INTRAVENOUS | Status: DC | PRN
  Administered 2020-04-11: 96 mL

## 2020-04-11 MED ORDER — CELECOXIB 200 MG PO CAPS
200.0000 mg | ORAL_CAPSULE | Freq: Once | ORAL | Status: AC
  Administered 2020-04-11: 200 mg via ORAL
  Filled 2020-04-11: qty 1

## 2020-04-11 MED ORDER — FENTANYL CITRATE (PF) 250 MCG/5ML IJ SOLN
INTRAMUSCULAR | Status: AC
  Filled 2020-04-11: qty 5

## 2020-04-11 MED ORDER — GLYCOPYRROLATE PF 0.2 MG/ML IJ SOSY
PREFILLED_SYRINGE | INTRAMUSCULAR | Status: DC | PRN
  Administered 2020-04-11: .2 mg via INTRAVENOUS

## 2020-04-11 MED ORDER — KETOROLAC TROMETHAMINE 15 MG/ML IJ SOLN: 15.0000 mg | Freq: Four times a day (QID) | INTRAMUSCULAR | Status: DC | PRN

## 2020-04-11 MED ORDER — FENTANYL CITRATE (PF) 100 MCG/2ML IJ SOLN
INTRAMUSCULAR | Status: AC
  Administered 2020-04-11: 25 ug via INTRAVENOUS
  Filled 2020-04-11: qty 2

## 2020-04-11 MED ORDER — HYDRALAZINE HCL 20 MG/ML IJ SOLN
INTRAMUSCULAR | Status: AC
  Administered 2020-04-11: 10 mg via INTRAVENOUS
  Filled 2020-04-11: qty 1

## 2020-04-11 MED ORDER — ALBUTEROL SULFATE HFA 108 (90 BASE) MCG/ACT IN AERS
INHALATION_SPRAY | RESPIRATORY_TRACT | Status: AC
  Filled 2020-04-11: qty 6.7

## 2020-04-11 MED ORDER — LIDOCAINE 2% (20 MG/ML) 5 ML SYRINGE
INTRAMUSCULAR | Status: DC | PRN
  Administered 2020-04-11: 60 mg via INTRAVENOUS

## 2020-04-11 MED ORDER — LABETALOL HCL 5 MG/ML IV SOLN
INTRAVENOUS | Status: AC
  Filled 2020-04-11: qty 4

## 2020-04-11 MED ORDER — ASPIRIN 81 MG PO CHEW
81.0000 mg | CHEWABLE_TABLET | Freq: Every day | ORAL | Status: DC
  Administered 2020-04-12 – 2020-04-14 (×3): 81 mg via ORAL
  Filled 2020-04-11 (×3): qty 1

## 2020-04-11 MED ORDER — METOPROLOL TARTRATE 5 MG/5ML IV SOLN
INTRAVENOUS | Status: AC
  Filled 2020-04-11: qty 5

## 2020-04-11 MED ORDER — METOCLOPRAMIDE HCL 5 MG/ML IJ SOLN
10.0000 mg | Freq: Four times a day (QID) | INTRAMUSCULAR | Status: AC
  Administered 2020-04-12 (×4): 10 mg via INTRAVENOUS
  Filled 2020-04-11 (×4): qty 2

## 2020-04-11 MED ORDER — SODIUM CHLORIDE 0.9 % IR SOLN
Status: DC | PRN
  Administered 2020-04-11: 1000 mL

## 2020-04-11 MED ORDER — CHLORHEXIDINE GLUCONATE CLOTH 2 % EX PADS
6.0000 | MEDICATED_PAD | Freq: Every day | CUTANEOUS | Status: DC
  Administered 2020-04-12 – 2020-04-13 (×2): 6 via TOPICAL

## 2020-04-11 MED ORDER — BISACODYL 5 MG PO TBEC
10.0000 mg | DELAYED_RELEASE_TABLET | Freq: Every day | ORAL | Status: DC
  Administered 2020-04-12 – 2020-04-13 (×2): 10 mg via ORAL
  Filled 2020-04-11 (×2): qty 2

## 2020-04-11 MED ORDER — INDOCYANINE GREEN 25 MG IV SOLR
INTRAVENOUS | Status: AC
  Filled 2020-04-11: qty 10

## 2020-04-11 MED ORDER — PROMETHAZINE HCL 25 MG/ML IJ SOLN: 6.2500 mg | INTRAMUSCULAR | Status: DC | PRN

## 2020-04-11 MED ORDER — IRBESARTAN 150 MG PO TABS
150.0000 mg | ORAL_TABLET | Freq: Every day | ORAL | Status: DC
  Administered 2020-04-11 – 2020-04-14 (×4): 150 mg via ORAL
  Filled 2020-04-11 (×5): qty 1

## 2020-04-11 MED ORDER — ALBUMIN HUMAN 5 % IV SOLN: INTRAVENOUS | Status: DC | PRN

## 2020-04-11 MED ORDER — METOPROLOL TARTRATE 5 MG/5ML IV SOLN
2.5000 mg | Freq: Four times a day (QID) | INTRAVENOUS | Status: DC | PRN
  Administered 2020-04-11: 5 mg via INTRAVENOUS
  Filled 2020-04-11 (×2): qty 5

## 2020-04-11 MED ORDER — BUPIVACAINE HCL (PF) 0.5 % IJ SOLN
INTRAMUSCULAR | Status: AC
  Filled 2020-04-11: qty 30

## 2020-04-11 SURGICAL SUPPLY — 127 items
APPLIER CLIP ROT 10 11.4 M/L (STAPLE)
BLADE CLIPPER SURG (BLADE) ×3 IMPLANT
BLADE SURG SZ11 CARB STEEL (BLADE) IMPLANT
BNDG COHESIVE 6X5 TAN STRL LF (GAUZE/BANDAGES/DRESSINGS) IMPLANT
CANISTER SUCT 3000ML PPV (MISCELLANEOUS) ×6 IMPLANT
CANNULA REDUC XI 12-8 STAPL (CANNULA) ×2
CANNULA REDUC XI 12-8MM STAPL (CANNULA) ×2
CANNULA REDUCER 12-8 DVNC XI (CANNULA) ×2 IMPLANT
CATH THORACIC 28FR (CATHETERS) IMPLANT
CATH THORACIC 28FR RT ANG (CATHETERS) IMPLANT
CATH THORACIC 36FR (CATHETERS) IMPLANT
CATH THORACIC 36FR RT ANG (CATHETERS) IMPLANT
CLIP APPLIE ROT 10 11.4 M/L (STAPLE) IMPLANT
CLIP VESOCCLUDE MED 6/CT (CLIP) IMPLANT
CNTNR URN SCR LID CUP LEK RST (MISCELLANEOUS) ×17 IMPLANT
CONN ST 1/4X3/8  BEN (MISCELLANEOUS) ×2
CONN ST 1/4X3/8 BEN (MISCELLANEOUS) ×1 IMPLANT
CONN Y 3/8X3/8X3/8  BEN (MISCELLANEOUS)
CONN Y 3/8X3/8X3/8 BEN (MISCELLANEOUS) IMPLANT
CONT SPEC 4OZ STRL OR WHT (MISCELLANEOUS) ×34
DEFOGGER SCOPE WARMER CLEARIFY (MISCELLANEOUS) ×3 IMPLANT
DERMABOND ADVANCED (GAUZE/BANDAGES/DRESSINGS) ×2
DERMABOND ADVANCED .7 DNX12 (GAUZE/BANDAGES/DRESSINGS) ×1 IMPLANT
DRAIN CHANNEL 28F RND 3/8 FF (WOUND CARE) ×3 IMPLANT
DRAIN CHANNEL 32F RND 10.7 FF (WOUND CARE) IMPLANT
DRAPE ARM DVNC X/XI (DISPOSABLE) ×4 IMPLANT
DRAPE COLUMN DVNC XI (DISPOSABLE) ×1 IMPLANT
DRAPE CV SPLIT W-CLR ANES SCRN (DRAPES) ×3 IMPLANT
DRAPE DA VINCI XI ARM (DISPOSABLE) ×8
DRAPE DA VINCI XI COLUMN (DISPOSABLE) ×2
DRAPE INCISE IOBAN 66X45 STRL (DRAPES) IMPLANT
DRAPE ORTHO SPLIT 77X108 STRL (DRAPES) ×2
DRAPE SURG ORHT 6 SPLT 77X108 (DRAPES) ×1 IMPLANT
ELECT BLADE 6.5 EXT (BLADE) ×3 IMPLANT
ELECT REM PT RETURN 9FT ADLT (ELECTROSURGICAL) ×3
ELECTRODE REM PT RTRN 9FT ADLT (ELECTROSURGICAL) ×1 IMPLANT
GAUZE KITTNER 4X5 RF (MISCELLANEOUS) ×9 IMPLANT
GAUZE SPONGE 4X4 12PLY STRL (GAUZE/BANDAGES/DRESSINGS) ×3 IMPLANT
GLOVE BIO SURGEON STRL SZ 6.5 (GLOVE) ×6 IMPLANT
GLOVE BIO SURGEONS STRL SZ 6.5 (GLOVE) ×3
GLOVE SURG SIGNA 7.5 PF LTX (GLOVE) ×3 IMPLANT
GLOVE SURG SS PI 8.0 STRL IVOR (GLOVE) ×3 IMPLANT
GLOVE SURG SYN 7.5  E (GLOVE) ×6
GLOVE SURG SYN 7.5 E (GLOVE) ×3 IMPLANT
GLOVE SURG UNDER POLY LF SZ6.5 (GLOVE) ×6 IMPLANT
GLOVE TRIUMPH SURG SIZE 7.5 (KITS) ×6 IMPLANT
GOWN STRL REUS W/ TWL LRG LVL3 (GOWN DISPOSABLE) ×2 IMPLANT
GOWN STRL REUS W/ TWL XL LVL3 (GOWN DISPOSABLE) ×3 IMPLANT
GOWN STRL REUS W/TWL 2XL LVL3 (GOWN DISPOSABLE) ×9 IMPLANT
GOWN STRL REUS W/TWL LRG LVL3 (GOWN DISPOSABLE) ×4
GOWN STRL REUS W/TWL XL LVL3 (GOWN DISPOSABLE) ×6
HANDLE STAPLE  ENDO EGIA 4 STD (STAPLE) ×2
HANDLE STAPLE ENDO EGIA 4 STD (STAPLE) ×1 IMPLANT
HEMOSTAT SURGICEL 2X14 (HEMOSTASIS) ×3 IMPLANT
IRRIGATION STRYKERFLOW (MISCELLANEOUS) ×1 IMPLANT
IRRIGATOR STRYKERFLOW (MISCELLANEOUS) ×3
KIT SUCTION CATH 14FR (SUCTIONS) IMPLANT
KIT TURNOVER KIT B (KITS) ×3 IMPLANT
LOOP VESSEL SUPERMAXI WHITE (MISCELLANEOUS) IMPLANT
NEEDLE HYPO 25GX1X1/2 BEV (NEEDLE) ×3 IMPLANT
NEEDLE SPNL 22GX3.5 QUINCKE BK (NEEDLE) IMPLANT
NS IRRIG 1000ML POUR BTL (IV SOLUTION) ×6 IMPLANT
PACK CHEST (CUSTOM PROCEDURE TRAY) ×3 IMPLANT
PAD ARMBOARD 7.5X6 YLW CONV (MISCELLANEOUS) ×6 IMPLANT
PORT ACCESS TROCAR AIRSEAL 12 (TROCAR) ×1 IMPLANT
PORT ACCESS TROCAR AIRSEAL 5M (TROCAR) ×2
PROGEL SPRAY TIP 11IN (MISCELLANEOUS) ×3
RELOAD STAPLER 2.5X45 WHT DVNC (STAPLE) ×2 IMPLANT
RELOAD STAPLER 3.5X45 BLU DVNC (STAPLE) ×8 IMPLANT
RELOAD STAPLER 4.3X45 GRN DVNC (STAPLE) ×2 IMPLANT
RELOAD TRI 60 ART MED THCK BLK (STAPLE) ×3 IMPLANT
SCISSORS LAP 5X35 DISP (ENDOMECHANICALS) IMPLANT
SEAL CANN UNIV 5-8 DVNC XI (MISCELLANEOUS) ×2 IMPLANT
SEAL XI 5MM-8MM UNIVERSAL (MISCELLANEOUS) ×4
SEALANT PROGEL (MISCELLANEOUS) ×3 IMPLANT
SEALANT SURG COSEAL 4ML (VASCULAR PRODUCTS) IMPLANT
SEALANT SURG COSEAL 8ML (VASCULAR PRODUCTS) IMPLANT
SEALER SYNCHRO 8 IS4000 DV (MISCELLANEOUS) ×2
SEALER SYNCHRO 8 IS4000 DVNC (MISCELLANEOUS) ×1 IMPLANT
SET TRI-LUMEN FLTR TB AIRSEAL (TUBING) ×3 IMPLANT
SHEARS HARMONIC HDI 20CM (ELECTROSURGICAL) IMPLANT
SHEET MEDIUM DRAPE 40X70 STRL (DRAPES) ×3 IMPLANT
SOLUTION ELECTROLUBE (MISCELLANEOUS) ×3 IMPLANT
SPONGE INTESTINAL PEANUT (DISPOSABLE) IMPLANT
SPONGE TONSIL TAPE 1 RFD (DISPOSABLE) IMPLANT
STAPLER 45 SUREFORM CVD (STAPLE) ×2
STAPLER 45 SUREFORM CVD DVNC (STAPLE) ×1 IMPLANT
STAPLER CANNULA SEAL DVNC XI (STAPLE) ×2 IMPLANT
STAPLER CANNULA SEAL XI (STAPLE) ×4
STAPLER RELOAD 2.5X45 WHITE (STAPLE) ×4
STAPLER RELOAD 2.5X45 WHT DVNC (STAPLE) ×2
STAPLER RELOAD 3.5X45 BLU DVNC (STAPLE) ×8
STAPLER RELOAD 3.5X45 BLUE (STAPLE) ×16
STAPLER RELOAD 4.3X45 GREEN (STAPLE) ×4
STAPLER RELOAD 4.3X45 GRN DVNC (STAPLE) ×2
SUT PDS AB 3-0 SH 27 (SUTURE) IMPLANT
SUT PROLENE 4 0 RB 1 (SUTURE)
SUT PROLENE 4-0 RB1 .5 CRCL 36 (SUTURE) IMPLANT
SUT SILK  1 MH (SUTURE) ×4
SUT SILK 1 MH (SUTURE) ×2 IMPLANT
SUT SILK 1 TIES 10X30 (SUTURE) ×3 IMPLANT
SUT SILK 2 0 SH (SUTURE) ×3 IMPLANT
SUT SILK 2 0SH CR/8 30 (SUTURE) IMPLANT
SUT SILK 3 0 SH 30 (SUTURE) IMPLANT
SUT SILK 3 0SH CR/8 30 (SUTURE) IMPLANT
SUT VIC AB 1 CTX 36 (SUTURE) ×2
SUT VIC AB 1 CTX36XBRD ANBCTR (SUTURE) ×1 IMPLANT
SUT VIC AB 2-0 CTX 36 (SUTURE) ×3 IMPLANT
SUT VIC AB 3-0 MH 27 (SUTURE) IMPLANT
SUT VIC AB 3-0 X1 27 (SUTURE) ×6 IMPLANT
SUT VICRYL 0 TIES 12 18 (SUTURE) ×3 IMPLANT
SUT VICRYL 0 UR6 27IN ABS (SUTURE) ×6 IMPLANT
SUT VICRYL 2 TP 1 (SUTURE) IMPLANT
SYR 20ML ECCENTRIC (SYRINGE) ×3 IMPLANT
SYR 30ML LL (SYRINGE) ×3 IMPLANT
SYR BULB IRRIG 60ML STRL (SYRINGE) ×3 IMPLANT
SYSTEM RETRIEVAL ANCHOR 12 (MISCELLANEOUS) ×3 IMPLANT
SYSTEM SAHARA CHEST DRAIN ATS (WOUND CARE) ×3 IMPLANT
TAPE CLOTH 4X10 WHT NS (GAUZE/BANDAGES/DRESSINGS) ×3 IMPLANT
TAPE CLOTH SURG 4X10 WHT LF (GAUZE/BANDAGES/DRESSINGS) ×3 IMPLANT
TIP APPLICATOR SPRAY EXTEND 16 (VASCULAR PRODUCTS) IMPLANT
TIP SPRAY PROGEL 11IN (MISCELLANEOUS) ×1 IMPLANT
TOWEL GREEN STERILE (TOWEL DISPOSABLE) ×3 IMPLANT
TRAY FOLEY MTR SLVR 16FR STAT (SET/KITS/TRAYS/PACK) ×3 IMPLANT
TRAY WAYNE PNEUMOTHORAX 14X18 (TRAY / TRAY PROCEDURE) ×3 IMPLANT
TROCAR BLADELESS 15MM (ENDOMECHANICALS) IMPLANT
WATER STERILE IRR 1000ML POUR (IV SOLUTION) ×6 IMPLANT

## 2020-04-11 NOTE — Anesthesia Procedure Notes (Signed)
Arterial Line Insertion Start/End12/10/2019 11:10 AM, 04/11/2020 11:15 AM Performed by: Janene Harvey, CRNA, CRNA  Patient location: Pre-op. Preanesthetic checklist: patient identified, IV checked and risks and benefits discussed Lidocaine 1% used for infiltration Left, radial was placed Catheter size: 20 G Hand hygiene performed  and maximum sterile barriers used  Allen's test indicative of satisfactory collateral circulation Attempts: 1 Procedure performed without using ultrasound guided technique. Following insertion, dressing applied and Biopatch. Post procedure assessment: normal  Patient tolerated the procedure well with no immediate complications.

## 2020-04-11 NOTE — Interval H&P Note (Signed)
History and Physical Interval Note:  04/11/2020 11:32 AM  Yolanda Bauer  has presented today for surgery, with the diagnosis of LLL MASS.  The various methods of treatment have been discussed with the patient and family. After consideration of risks, benefits and other options for treatment, the patient has consented to  Procedure(s): XI ROBOTIC ASSISTED THORACOSCOPY-LEFT LOWER LOBE SUPERIOR SEGMENTECTOMY (Left) as a surgical intervention.  The patient's history has been reviewed, patient examined, no change in status, stable for surgery.  I have reviewed the patient's chart and labs.  Questions were answered to the patient's satisfaction.     [name]

## 2020-04-11 NOTE — Anesthesia Procedure Notes (Signed)
Procedure Name: Intubation Date/Time: 04/11/2020 12:35 PM Performed by: Renato Shin, CRNA Pre-anesthesia Checklist: Patient identified, Emergency Drugs available, Suction available and Patient being monitored Patient Re-evaluated:Patient Re-evaluated prior to induction Oxygen Delivery Method: Circle system utilized Preoxygenation: Pre-oxygenation with 100% oxygen Induction Type: IV induction Ventilation: Mask ventilation without difficulty Laryngoscope Size: Miller and 2 Grade View: Grade II Tube type: Oral Endobronchial tube: Left, Double lumen EBT, EBT position confirmed by auscultation and EBT position confirmed by fiberoptic bronchoscope and 37 Fr Number of attempts: 1 Airway Equipment and Method: Stylet and Oral airway Placement Confirmation: ETT inserted through vocal cords under direct vision,  positive ETCO2 and breath sounds checked- equal and bilateral Tube secured with: Tape Dental Injury: Teeth and Oropharynx as per pre-operative assessment

## 2020-04-11 NOTE — Transfer of Care (Signed)
Immediate Anesthesia Transfer of Care Note  Patient: Yolanda Bauer  Procedure(s) Performed: XI ROBOTIC ASSISTED THORACOSCOPY-LEFT LOWER LOBE SUPERIOR SEGMENTECTOMY (Left Chest) INTERCOSTAL NERVE BLOCK (Left Chest) NODE DISSECTION (Left Chest)  Patient Location: PACU  Anesthesia Type:General  Level of Consciousness: drowsy, patient cooperative and responds to stimulation  Airway & Oxygen Therapy: Patient Spontanous Breathing and Patient connected to face mask oxygen  Post-op Assessment: Report given to RN and Post -op Vital signs reviewed and stable  Post vital signs: Reviewed and stable  Last Vitals:  Vitals Value Taken Time  BP 152/80 04/11/20 1627  Temp    Pulse 69 04/11/20 1631  Resp 18 04/11/20 1631  SpO2 100 % 04/11/20 1631  Vitals shown include unvalidated device data.  Last Pain:  Vitals:   04/11/20 1024  PainSc: 3       Patients Stated Pain Goal: 0 (25/48/62 8241)  Complications: No complications documented.

## 2020-04-11 NOTE — Anesthesia Postprocedure Evaluation (Signed)
Anesthesia Post Note  Patient: Yolanda Bauer  Procedure(s) Performed: XI ROBOTIC ASSISTED THORACOSCOPY-LEFT LOWER LOBE SUPERIOR SEGMENTECTOMY (Left Chest) INTERCOSTAL NERVE BLOCK (Left Chest) NODE DISSECTION (Left Chest)     Patient location during evaluation: PACU Anesthesia Type: General Level of consciousness: sedated Pain management: pain level controlled Vital Signs Assessment: post-procedure vital signs reviewed and stable Respiratory status: spontaneous breathing and respiratory function stable Cardiovascular status: stable Postop Assessment: no apparent nausea or vomiting Anesthetic complications: no   No complications documented.  Last Vitals:  Vitals:   04/11/20 1730 04/11/20 1745  BP: (!) 193/89 (!) 185/81  Pulse: 72 70  Resp: (!) 21 17  Temp:    SpO2: 99% 92%    Last Pain:  Vitals:   04/11/20 1645  PainSc: 8                  Arline Ketter DANIEL

## 2020-04-11 NOTE — Anesthesia Preprocedure Evaluation (Addendum)
Anesthesia Evaluation  Patient identified by MRN, date of birth, ID band Patient awake    Reviewed: Allergy & Precautions, NPO status , Patient's Chart, lab work & pertinent test results  History of Anesthesia Complications Negative for: history of anesthetic complications  Airway Mallampati: II  TM Distance: >3 FB Neck ROM: Full    Dental  (+) Poor Dentition, Dental Advisory Given   Pulmonary former smoker,    Pulmonary exam normal        Cardiovascular hypertension, Pt. on medications and Pt. on home beta blockers Normal cardiovascular exam     Neuro/Psych PSYCHIATRIC DISORDERS Anxiety negative neurological ROS     GI/Hepatic negative GI ROS, Neg liver ROS,   Endo/Other  negative endocrine ROS  Renal/GU negative Renal ROS     Musculoskeletal negative musculoskeletal ROS (+)   Abdominal   Peds  Hematology negative hematology ROS (+)   Anesthesia Other Findings   Reproductive/Obstetrics                            Anesthesia Physical Anesthesia Plan  ASA: III  Anesthesia Plan: General   Post-op Pain Management:    Induction: Intravenous  PONV Risk Score and Plan: 4 or greater and Ondansetron, Dexamethasone and Scopolamine patch - Pre-op  Airway Management Planned: Double Lumen EBT  Additional Equipment: Arterial line and CVP  Intra-op Plan:   Post-operative Plan: Post-operative intubation/ventilation  Informed Consent: I have reviewed the patients History and Physical, chart, labs and discussed the procedure including the risks, benefits and alternatives for the proposed anesthesia with the patient or authorized representative who has indicated his/her understanding and acceptance.     Dental advisory given  Plan Discussed with: Anesthesiologist, CRNA and Surgeon  Anesthesia Plan Comments:        Anesthesia Quick Evaluation

## 2020-04-11 NOTE — Brief Op Note (Addendum)
04/11/2020  4:07 PM  PATIENT:  Yolanda Bauer  74 y.o. female  PRE-OPERATIVE DIAGNOSIS:  LLL MASS . CLINICAL STAGE IIA(T2N0)  POST-OPERATIVE DIAGNOSIS:  LLL MASS. CLINICAL STAGE IIA(T2N0)  PROCEDURE:  Procedure(s):  XI ROBOTIC ASSISTED THORACOSCOPY- -LEFT LOWER LOBE SUPERIOR SEGMENTECTOMY (Left) -INTERCOSTAL NERVE BLOCK (Left) -NODE DISSECTION (Left)  SURGEON:  Surgeon(s) and Role:    * [name], MD - Primary  PHYSICIAN ASSISTANT: Ellwood Handler PA-C  ANESTHESIA:   general  EBL:  20 mL   BLOOD ADMINISTERED:none  DRAINS: 28 Blake Drain, Pigtail Catheter   LOCAL MEDICATIONS USED:  BUPIVICAINE   SPECIMEN:  Source of Specimen:  Superior Segment LLL, Lymph Nodes  DISPOSITION OF SPECIMEN:  PATHOLOGY  COUNTS:  YES  TOURNIQUET:  * No tourniquets in log *  DICTATION: .Dragon Dictation  PLAN OF CARE: Admit to inpatient   PATIENT DISPOSITION:  PACU - hemodynamically stable.   Delay start of Pharmacological VTE agent (>24hrs) due to surgical blood loss or risk of bleeding: no  Bronchial and stapled margins negative for tumor

## 2020-04-12 ENCOUNTER — Other Ambulatory Visit: Payer: Self-pay

## 2020-04-12 ENCOUNTER — Inpatient Hospital Stay (HOSPITAL_COMMUNITY): Payer: Medicare Other

## 2020-04-12 ENCOUNTER — Encounter (HOSPITAL_COMMUNITY): Payer: Self-pay | Admitting: Thoracic Surgery (Cardiothoracic Vascular Surgery)

## 2020-04-12 LAB — CBC
HCT: 34.5 % — ABNORMAL LOW (ref 36.0–46.0)
Hemoglobin: 11 g/dL — ABNORMAL LOW (ref 12.0–15.0)
MCH: 27.6 pg (ref 26.0–34.0)
MCHC: 31.9 g/dL (ref 30.0–36.0)
MCV: 86.7 fL (ref 80.0–100.0)
Platelets: 244 10*3/uL (ref 150–400)
RBC: 3.98 MIL/uL (ref 3.87–5.11)
RDW: 15.7 % — ABNORMAL HIGH (ref 11.5–15.5)
WBC: 10.1 10*3/uL (ref 4.0–10.5)
nRBC: 0 % (ref 0.0–0.2)

## 2020-04-12 LAB — BASIC METABOLIC PANEL
Anion gap: 8 (ref 5–15)
BUN: 9 mg/dL (ref 8–23)
CO2: 27 mmol/L (ref 22–32)
Calcium: 8.5 mg/dL — ABNORMAL LOW (ref 8.9–10.3)
Chloride: 102 mmol/L (ref 98–111)
Creatinine, Ser: 0.69 mg/dL (ref 0.44–1.00)
GFR, Estimated: >60 mL/min (ref >60)
Glucose, Bld: 144 mg/dL — ABNORMAL HIGH (ref 70–99)
Potassium: 3.8 mmol/L (ref 3.5–5.1)
Sodium: 137 mmol/L (ref 135–145)

## 2020-04-12 MED ORDER — ALBUTEROL SULFATE (2.5 MG/3ML) 0.083% IN NEBU
2.5000 mg | INHALATION_SOLUTION | RESPIRATORY_TRACT | Status: DC
  Filled 2020-04-12: qty 3

## 2020-04-12 MED ORDER — KETOROLAC TROMETHAMINE 15 MG/ML IJ SOLN
15.0000 mg | Freq: Four times a day (QID) | INTRAMUSCULAR | Status: AC
  Administered 2020-04-12 – 2020-04-14 (×7): 15 mg via INTRAVENOUS
  Filled 2020-04-12 (×7): qty 1

## 2020-04-12 MED ORDER — ALBUTEROL SULFATE (2.5 MG/3ML) 0.083% IN NEBU: 2.5000 mg | INHALATION_SOLUTION | Freq: Four times a day (QID) | RESPIRATORY_TRACT | Status: DC | PRN

## 2020-04-12 MED ORDER — ENOXAPARIN SODIUM 40 MG/0.4ML ~~LOC~~ SOLN
40.0000 mg | Freq: Every day | SUBCUTANEOUS | Status: DC
  Administered 2020-04-12 – 2020-04-14 (×3): 40 mg via SUBCUTANEOUS
  Filled 2020-04-12 (×3): qty 0.4

## 2020-04-12 NOTE — Plan of Care (Signed)

## 2020-04-12 NOTE — Hospital Course (Signed)
History of Present Illness:  Yolanda Bauer is a 74 yo female with known tobacco abuse (1.5 ppd x 48 years), COPD, Pneumonia, HTN, and Hyperlipidemia.  The patient recently underwent low-dose screening CT for lung cancer screening.  She was found to have a 3.5 cm left lower lobe lung mass.  She was subsequently referred to Dr. Melvyn Novas who recommended PET CT san.  This showed the mass to be markedly hypermetabolic with an SUV of 15.  It was felt the patient should be evaluated for surgical resection and was referred to [facility].  The patient was evaluated by Dr. Roxan Hockey at which time the patient denied symptoms of shortness of breath, chest pain, pressure, or tightness.  She does admit to getting short of breath with heavy exertion but can carry her trash across her apartment complex and walk up a flight of stairs without difficulty.  It was felt surgical resection would be her best treatment option.  There was some concern that her PFTs were marginal for lobectomy.  The patient admitted to difficulties doing the test so these could have been artificially low.  However, it was felt to try and treat patient with surgical resection and ideally not perform full lobectomy if possible.  The risks and benefits of the procedure were explained to the patient and she was agreeable to proceed.   Hospital Course:  Yolanda Bauer presented to [facility] on 04/11/2020.  She was taken to the operating room and underwent Robotic Assisted Left Thoracoscopy, left lower lobe superior segmentectomy, intercostal nerve block, and lymph node dissection.  She tolerated the procedure without difficulty, was extubated, and taken to the PACU in stable condition.  The patients chest tube was free from air leak.  CXR showed no evidence of pneumothorax.  Her blake drain was removed on POD #1.  Her pigtail catheter remained in place and was later removed on ***.  Her arterial line was removed without difficulty.   She was restarted on her home anti-hypertensive medications prior to discharge.

## 2020-04-12 NOTE — Op Note (Signed)
NAME: Yolanda Bauer, Yolanda Bauer MEDICAL RECORD FT:[id number] ACCOUNT [id number] DATE OF BIRTH:[birth year] FACILITY: [facility] LOCATION: [facility] PHYSICIAN:Nilsa Macht Chaya Jan, MD  OPERATIVE REPORT  DATE OF PROCEDURE:  04/11/2020  PREOPERATIVE DIAGNOSIS:  Left lower lobe lung mass.  POSTOPERATIVE DIAGNOSIS:  Left lower lobe lung mass.  PROCEDURE:   Xi robotic-assisted left thoracoscopy, Left lower lobe superior segmentectomy, Mediastinal lymph node dissection, Intercostal nerve blocks at levels 3 through 10.  SURGEON:  Modesto Charon, MD  ASSISTANT:  Ellwood Handler, PA  ANESTHESIA:  General.  FINDINGS:   Large mass in superior segment.  Bronchial margin and closest staple margin negative for tumor.   Enlarged, but otherwise benign appearing lymph nodes.  CLINICAL NOTE:  Yolanda Bauer is a 74 year old woman with a history of tobacco abuse and COPD.  She recently had a low-dose CT for lung cancer screening, which showed a 3.5 cm mass in the superior segment of the left lower lobe.  A PET CT showed the mass  was markedly hypermetabolic with an SUV of 15 and she was referred for surgical resection.  There was no evidence of regional or distant metastatic disease.  She was advised to undergo a left lower lobe superior segmentectomy.  She had marginal PFTs and  was not a good candidate for lobectomy.  The indications, risks, benefits, and alternatives were discussed in detail with the patient.  She understood and accepted the risks and agreed to proceed.  OPERATIVE NOTE:  Yolanda Bauer was brought to the preoperative holding area on 04/11/2020.  Anesthesia placed a central venous catheter and an arterial blood pressure monitoring line.  She was taken to the operating room, anesthetized and intubated with a  double lumen endotracheal tube.  Intravenous antibiotics were administered.  A Foley catheter was placed.  Sequential compression devices were placed on the calves for DVT prophylaxis.  She was placed in  a right lateral decubitus position and the left  chest was prepped and draped in the usual sterile fashion.  Single lung ventilation of the right lung was initiated and was tolerated well throughout the procedure.  Active warming was in place.  The chest was prepped and draped in the usual sterile  fashion.  A timeout was performed.  A solution containing 20 mL of liposomal bupivacaine, 30 mL of 0.5% bupivacaine and 50 mL of saline was prepared.  This was used for local anesthesia at the incision sites as well as for the intercostal nerve blocks.  An  incision was made in the 8th interspace in the midaxillary line.  An 8 mm port was inserted.  The thoracoscope was advanced into the chest.  There was good isolation of the left lung.  Carbon dioxide was insufflated per protocol.  An additional port was placed in the  8th interspace anteriorly, this was a 12 mm port.  A 12 mm AirSeal port was placed in the 10th interspace centered between the 2 anterior ports.  Intercostal nerve blocks then were performed from the 3rd to the 10th interspace.  10 mL of the bupivacaine  solution was injected into a subpleural plane at each level from a posterior approach.  Two additional ports, one 12 mm and one 8 mm were placed in the 8th interspace.  The robot was deployed and the camera arm was docked and targeting was performed.   The remaining arms were docked.  The instruments were inserted with thoracoscopic visualization.  Quick inspection showed the outline of the mass in the superior segment of the left  lower lobe.  Lung was retracted superiorly.  The inferior ligament was  divided with bipolar cautery.  All lymph nodes that were encountered were removed and sent as separate specimens.  Nodes were taken from levels 9, 7, 10, 5, 11, 12 and 13.  Multiple nodes were taken at some of the levels.  Several of the nodes were  relatively enlarged, but all appeared grossly benign with no gross evidence of cancer.  The pleural  reflection was divided at the hilum posteriorly and the level 7 and posterior 10 nodes were removed.  As the pleural reflection was divided at the hilum  superiorly, the level 5 node was identified and that was removed.  Anteriorly, the fissure was incomplete.  Posteriorly, the fissure was almost entirely complete.  The pulmonary artery was visible in the fissure posteriorly.  The pleura was dissected off  of this and then a plane was developed and the small amount of the incomplete fissure posteriorly was completed with a stapler.  This exposed a small superior segmental branch.  It turned out there were actually 3 small branches to the superior segment  rather than 1 normal size branch.  The first of the small branches was divided with the vascular stapler.  As the dissection continued down along the pulmonary artery, the anatomy was unusual given the 3 small branches, so the decision was made to open the  anterior part of the fissure and completely expose the pulmonary artery branches to the lower lobe before dividing any further branches.  This was done with sequential firings of the robotic stapler using blue cartridges.  At this point, it was clear  that the 2 small PA branches were supplying the superior segment and these were encircled and divided with the vascular stapler.  The superior segmental bronchus then was dissected out, the stapler was placed across the bronchus.  A blue cartridge was  used.  It was closed and fired.  Intravenous ICG was administered and the Firefly was used to see the demarcation of the superior segment from the basilar segments.  The segmentectomy was completed with sequential firings of the robotic stapler using  both blue and green cartridges taking a margin of the basilar segment along with the superior segmental specimen.  The specimen was placed into an endoscopic retrieval bag.  The sponges and vessel loop were removed.  The specimen was brought down to the   inferior aspect of the chest.  The robot was undocked and the anterior 8th interspace incision was lengthened slightly and the specimen was removed through that.  The closest stapled margin was marked with a suture and the specimen was sent for frozen  section of both the stapled margin and the bronchial margin, both of which returned negative for tumor.  The chest was copiously irrigated with warm saline.  A test inflation showed some leakage from the dissection in the fissure.  Progel was applied to  this area.  A 28-French Blake drain was placed through the original port incision and directed to the apex.  It was secured with a #1 silk suture.  A 14-French pigtail catheter was placed anteriorly in case the patient had a prolonged air leak.  This was  secured with a 0 silk suture.  After receiving confirmation of the frozen section results, the remaining ports were removed.  Dual-lung ventilation was resumed.  The remaining incisions were closed with 0 Vicryl fascial sutures and 3-0 Vicryl  subcuticular sutures.  Dermabond was applied.  The chest tube and pigtail were placed to Pleur-Evac on waterseal.  The patient then was placed back in the supine position.  She was extubated in the operating room and taken to the [facility]  in good condition.  HN/NUANCE  D:04/11/2020 T:04/12/2020 JOB:[id number]

## 2020-04-12 NOTE — Progress Notes (Addendum)
1 Day Post-Op Procedure(s) (LRB): XI ROBOTIC ASSISTED THORACOSCOPY-LEFT LOWER LOBE SUPERIOR SEGMENTECTOMY (Left) INTERCOSTAL NERVE BLOCK (Left) NODE DISSECTION (Left) Subjective: Some incisional pain, no nausea this AM  Objective: Vital signs in last 24 hours: Temp:  [97.1 F (36.2 C)-99.6 F (37.6 C)] 99.4 F (37.4 C) (12/07 0417) Pulse Rate:  [62-85] 82 (12/07 0600) Cardiac Rhythm: Normal sinus rhythm (12/07 0400) Resp:  [12-37] 27 (12/07 0600) BP: (98-196)/(46-89) 121/57 (12/07 0500) SpO2:  [89 %-100 %] 99 % (12/07 0600) Arterial Line BP: (97-215)/(41-86) 133/62 (12/07 0600) Weight:  [68.9 kg] 68.9 kg (12/06 0947)  Hemodynamic parameters for last 24 hours:    Intake/Output from previous day: 12/06 0701 - 12/07 0700 In: 2828.2 [P.O.:100; I.V.:1778.2; IV Piggyback:650] Out: 1402 [Urine:1025; Blood:80; Chest Tube:297] Intake/Output this shift: No intake/output data recorded.  General appearance: alert, cooperative and no distress Neurologic: intact Heart: regular rate and rhythm Lungs: diminished breath sounds bibasilar Abdomen: normal findings: soft, non-tender no air leak  Lab Results: Recent Labs    04/12/20 0510  WBC 10.1  HGB 11.0*  HCT 34.5*  PLT 244   BMET:  Recent Labs    04/12/20 0510  NA 137  K 3.8  CL 102  CO2 27  GLUCOSE 144*  BUN 9  CREATININE 0.69  CALCIUM 8.5*    PT/INR:  Recent Labs    04/11/20 1030  LABPROT 14.4  INR 1.2   ABG    Component Value Date/Time   PHART 7.383 04/07/2020 1334   HCO3 29.9 (H) 04/07/2020 1334   O2SAT 93.7 04/07/2020 1334   CBG (last 3)  No results for input(s): GLUCAP in the last 72 hours.  Assessment/Plan: S/P Procedure(s) (LRB): XI ROBOTIC ASSISTED THORACOSCOPY-LEFT LOWER LOBE SUPERIOR SEGMENTECTOMY (Left) INTERCOSTAL NERVE BLOCK (Left) NODE DISSECTION (Left) Plan for transfer to step-down: see transfer orders  POD # 1 doing well Incisional pain- continue acetaminophen, change ketorolac to  standing dose x 48 hours, PRN oxycodone Incentive spirometry SCD + enoxaparin for DVT prophylaxis Ambulate DC A line DC Foley No air leak- dc Blake drain, will keep pigtail for now   LOS: 1 day    [name] 04/12/2020

## 2020-04-12 NOTE — Progress Notes (Signed)
Patient report given to [facility] RN, transferred via wheel chair, patient stable no acute distress.

## 2020-04-12 NOTE — Progress Notes (Signed)
[facility] BRIEF SICU PROGRESS NOTE  1 Day Post-Op  S/P Procedure(s) (LRB): XI ROBOTIC ASSISTED THORACOSCOPY-LEFT LOWER LOBE SUPERIOR SEGMENTECTOMY (Left) INTERCOSTAL NERVE BLOCK (Left) NODE DISSECTION (Left)   Stable day  Plan: Awaiting bed for transfer  Rexene Alberts, MD 04/12/2020 5:11 PM

## 2020-04-13 ENCOUNTER — Inpatient Hospital Stay (HOSPITAL_COMMUNITY): Payer: Medicare Other

## 2020-04-13 LAB — COMPREHENSIVE METABOLIC PANEL
ALT: 16 U/L (ref 0–44)
AST: 24 U/L (ref 15–41)
Albumin: 2.9 g/dL — ABNORMAL LOW (ref 3.5–5.0)
Alkaline Phosphatase: 50 U/L (ref 38–126)
Anion gap: 8 (ref 5–15)
BUN: 17 mg/dL (ref 8–23)
CO2: 25 mmol/L (ref 22–32)
Calcium: 8.6 mg/dL — ABNORMAL LOW (ref 8.9–10.3)
Chloride: 103 mmol/L (ref 98–111)
Creatinine, Ser: 0.68 mg/dL (ref 0.44–1.00)
GFR, Estimated: >60 mL/min (ref >60)
Glucose, Bld: 122 mg/dL — ABNORMAL HIGH (ref 70–99)
Potassium: 3.9 mmol/L (ref 3.5–5.1)
Sodium: 136 mmol/L (ref 135–145)
Total Bilirubin: 0.5 mg/dL (ref 0.3–1.2)
Total Protein: 6 g/dL — ABNORMAL LOW (ref 6.5–8.1)

## 2020-04-13 LAB — CBC
HCT: 37.1 % (ref 36.0–46.0)
Hemoglobin: 11.5 g/dL — ABNORMAL LOW (ref 12.0–15.0)
MCH: 27.1 pg (ref 26.0–34.0)
MCHC: 31 g/dL (ref 30.0–36.0)
MCV: 87.3 fL (ref 80.0–100.0)
Platelets: 274 10*3/uL (ref 150–400)
RBC: 4.25 MIL/uL (ref 3.87–5.11)
RDW: 15.9 % — ABNORMAL HIGH (ref 11.5–15.5)
WBC: 10.2 10*3/uL (ref 4.0–10.5)
nRBC: 0 % (ref 0.0–0.2)

## 2020-04-13 MED ORDER — ALBUTEROL SULFATE (2.5 MG/3ML) 0.083% IN NEBU
2.5000 mg | INHALATION_SOLUTION | Freq: Four times a day (QID) | RESPIRATORY_TRACT | Status: DC | PRN
  Administered 2020-04-13: 2.5 mg via RESPIRATORY_TRACT

## 2020-04-13 MED ORDER — ALBUTEROL SULFATE (2.5 MG/3ML) 0.083% IN NEBU
2.5000 mg | INHALATION_SOLUTION | Freq: Four times a day (QID) | RESPIRATORY_TRACT | Status: DC
  Administered 2020-04-13: 2.5 mg via RESPIRATORY_TRACT
  Filled 2020-04-13: qty 3

## 2020-04-13 MED ORDER — ALBUTEROL SULFATE (2.5 MG/3ML) 0.083% IN NEBU
2.5000 mg | INHALATION_SOLUTION | Freq: Two times a day (BID) | RESPIRATORY_TRACT | Status: DC
  Administered 2020-04-14: 2.5 mg via RESPIRATORY_TRACT
  Filled 2020-04-13 (×2): qty 3

## 2020-04-13 NOTE — Plan of Care (Signed)

## 2020-04-13 NOTE — Discharge Summary (Addendum)
Physician Discharge Summary  Patient ID: Yolanda Bauer MRN: [id number] DOB/AGE: 1945/06/02 74 y.o.  Admit date: 04/11/2020 Discharge date: 04/14/2020  Admission Diagnoses:  Patient Active Problem List   Diagnosis Date Noted  . Solitary pulmonary nodule on lung CT 01/27/2020  . Essential hypertension 01/27/2020  . 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 S/P Superior Segmentectomy of Left Lower Lobe 04/11/2020  . Solitary pulmonary nodule on lung CT 01/27/2020  . Essential hypertension 01/27/2020  . Cigarette smoker 01/27/2020   Discharged Condition: good  History of Present Illness:  Yolanda Bauer is a 74 yo female with known tobacco abuse (1.5 ppd x 48 years), COPD, Pneumonia, HTN, and Hyperlipidemia.  The patient recently underwent low-dose screening CT for lung cancer screening.  She was found to have a 3.5 cm left lower lobe lung mass.  She was subsequently referred to Yolanda Bauer who recommended PET CT san.  This showed the mass to be markedly hypermetabolic with an SUV of 15.  It was felt the patient should be evaluated for surgical resection and was referred to [facility].  The patient was evaluated by Yolanda Bauer at which time the patient denied symptoms of shortness of breath, chest pain, pressure, or tightness.  She does admit to getting short of breath with heavy exertion but can carry her trash across her apartment complex and walk up a flight of stairs without difficulty.  It was felt surgical resection would be her best treatment option.  There was some concern that her PFTs were marginal for lobectomy.  The patient admitted to difficulties doing the test so these could have been artificially low.  However, it was felt to try and treat patient with surgical resection and ideally not perform full lobectomy if possible.  The  risks and benefits of the procedure were explained to the patient and she was agreeable to proceed.   Hospital Course:  Yolanda Bauer presented to [facility] on 04/11/2020.  She was taken to the operating room and underwent Robotic Assisted Left Thoracoscopy, left lower lobe superior segmentectomy, intercostal nerve block, and lymph node dissection.  She tolerated the procedure without difficulty, was extubated, and taken to the PACU in stable condition.  The patients chest tube was free from air leak.  CXR showed no evidence of pneumothorax.  Her blake drain was removed on POD #1. Follow up CXR showed no progression of apical space on the left.  Her pigtail catheter remained in place and was later removed on 04/13/2020.  Follow up CXR showed stable to improved appearance of apical space.  Her arterial line was removed without difficulty.  She was restarted on her home anti-hypertensive medications prior to discharge.  The patient was weaned down to 2L of oxygen.  She will require home usage.  She is ambulating without difficulty.  Her surgical incisions are healing without evidence of infection.  She is medically stable for discharge home today.  Significant Diagnostic Studies: nuclear medicine: PET  3.5 cm mass in the posterior left lower lobe, abutting the chest wall, corresponding to the patient's suspected primary bronchogenic neoplasm.  17 mm low-density right adrenal nodule, favoring a benign adrenal adenoma.  No findings suspicious for metastatic disease.  Treatments: surgery:    Xi robotic-assisted left thoracoscopy, left lower lobe superior segmentectomy, mediastinal lymph node dissection, intercostal nerve blocks at levels  3 through 10.  Discharge Exam: Blood pressure (!) 151/56, pulse 92, temperature 98.2 F (36.8 C), temperature source Axillary, resp. rate 19, height 5\' 2"  (1.575 m), weight 68.9 kg, SpO2 95 %.  General appearance: alert, cooperative and no distress Heart:  regular rate and rhythm Lungs: clear to auscultation bilaterally Abdomen: soft, non-tender; bowel sounds normal; no masses,  no organomegaly Extremities: extremities normal, atraumatic, no cyanosis or edema Wound: clean and dry  Allergies as of 04/14/2020      Reactions   Penicillins Hives      Medication List    TAKE these medications   acetaminophen 325 MG tablet Commonly known as: TYLENOL Take 650 mg by mouth every 6 (six) hours as needed for moderate pain.   aspirin 81 MG tablet Take 81 mg by mouth daily.   Coral Calcium 1000 (390 Ca) MG Tabs Take 1,000 mg by mouth every other day.   fluticasone 50 MCG/ACT nasal spray Commonly known as: FLONASE Place 1 spray into both nostrils daily as needed for allergies.   guaiFENesin 600 MG 12 hr tablet Commonly known as: MUCINEX Take 1 tablet (600 mg total) by mouth 2 (two) times daily.   loratadine 10 MG tablet Commonly known as: CLARITIN Take 10 mg by mouth daily.   metoprolol succinate 50 MG 24 hr tablet Commonly known as: TOPROL-XL Take 50 mg by mouth daily. Take with or immediately following a meal.   multivitamin tablet Take 1 tablet by mouth every other day.   olmesartan-hydrochlorothiazide 20-12.5 MG tablet Commonly known as: Benicar HCT Take 1 tablet by mouth daily.   oxyCODONE 5 MG immediate release tablet Commonly known as: Oxy IR/ROXICODONE Take 1 tablet (5 mg total) by mouth every 4 (four) hours as needed for moderate pain.   pravastatin 40 MG tablet Commonly known as: PRAVACHOL Take 40 mg by mouth at bedtime.   vitamin C 500 MG tablet Commonly known as: ASCORBIC ACID Take 500 mg by mouth daily.            Durable Medical Equipment  (From admission, onward)         Start     Ordered   04/14/20 0714  For home use only DME oxygen  Once       Question Answer Comment  Length of Need 6 Months   Mode or (Route) Nasal cannula   Liters per Minute 2   Frequency Continuous (stationary and portable  oxygen unit needed)   Oxygen delivery system Gas      04/14/20 0713           Signed: Ellwood Handler, PA-C  04/14/2020, 9:25 AM

## 2020-04-13 NOTE — Progress Notes (Addendum)
      [address]       [place] [address]             [phone]      2 Days Post-Op Procedure(s) (LRB): XI ROBOTIC ASSISTED THORACOSCOPY-LEFT LOWER LOBE SUPERIOR SEGMENTECTOMY (Left) INTERCOSTAL NERVE BLOCK (Left) NODE DISSECTION (Left)   Subjective:  Up on side of bed, getting ready to eat breakfast.  Doing okay, denies specific complaints.   Objective: Vital signs in last 24 hours: Temp:  [97.6 F (36.4 C)-98.6 F (37 C)] 97.6 F (36.4 C) (12/08 0416) Pulse Rate:  [60-83] 64 (12/08 0416) Cardiac Rhythm: Normal sinus rhythm (12/08 0700) Resp:  [12-26] 20 (12/08 0416) BP: (97-157)/(48-100) 125/57 (12/08 0416) SpO2:  [92 %-100 %] 98 % (12/08 0416) Arterial Line BP: (130)/(57) 130/57 (12/07 0800)  Intake/Output from previous day: 12/07 0701 - 12/08 0700 In: 799.8 [P.O.:565; I.V.:234.8] Out: 220 [Chest Tube:220]  General appearance: alert, cooperative and no distress Heart: regular rate and rhythm Lungs: wheezes bilaterally Abdomen: soft, non-tender; bowel sounds normal; no masses,  no organomegaly Extremities: extremities normal, atraumatic, no cyanosis or edema Wound: clean and dry  Lab Results: Recent Labs    04/12/20 0510 04/13/20 0022  WBC 10.1 10.2  HGB 11.0* 11.5*  HCT 34.5* 37.1  PLT 244 274   BMET:  Recent Labs    04/12/20 0510 04/13/20 0022  NA 137 136  K 3.8 3.9  CL 102 103  CO2 27 25  GLUCOSE 144* 122*  BUN 9 17  CREATININE 0.69 0.68  CALCIUM 8.5* 8.6*    PT/INR:  Recent Labs    04/11/20 1030  LABPROT 14.4  INR 1.2   ABG    Component Value Date/Time   PHART 7.383 04/07/2020 1334   HCO3 29.9 (H) 04/07/2020 1334   O2SAT 93.7 04/07/2020 1334   CBG (last 3)  No results for input(s): GLUCAP in the last 72 hours.  Assessment/Plan: S/P Procedure(s) (LRB): XI ROBOTIC ASSISTED THORACOSCOPY-LEFT LOWER LOBE SUPERIOR SEGMENTECTOMY (Left) INTERCOSTAL NERVE BLOCK (Left) NODE DISSECTION (Left)  1. CV- NSR, BP  stable- continue Toprol XL, Avapro, HCTZ 2. Pulm- no air leak from pigtail, CXR with slight increase in left sided apical pneumothorax vs. Space... defer pigtail management to Dr. Roxan Hockey.. will wean oxygen as tolerated, nebs prn wheezing 3. Lytes, Hgb okay 4. Lovenox for DVT prophylaxis 5. Dispo- patient stable, no air leak from pigtail catheter, slight increase in left apical pneumothorax, pulm toilet, continue current care   LOS: 2 days    Ellwood Handler, PA-C 04/13/2020 Patient seen and examined, agree with above No air leak and minimal drainage- dc pigtail Continue ambulation Path pending  Remo Lipps C. Roxan Hockey, MD [facility] [phone]

## 2020-04-14 ENCOUNTER — Inpatient Hospital Stay (HOSPITAL_COMMUNITY): Payer: Medicare Other

## 2020-04-14 LAB — SURGICAL PATHOLOGY

## 2020-04-14 MED ORDER — GUAIFENESIN ER 600 MG PO TB12
600.0000 mg | ORAL_TABLET | Freq: Two times a day (BID) | ORAL | Status: DC
  Administered 2020-04-14: 600 mg via ORAL
  Filled 2020-04-14: qty 1

## 2020-04-14 MED ORDER — GUAIFENESIN ER 600 MG PO TB12
600.0000 mg | ORAL_TABLET | Freq: Two times a day (BID) | ORAL | Status: DC
Start: 2020-04-14 — End: 2021-06-06

## 2020-04-14 MED ORDER — OXYCODONE HCL 5 MG PO TABS: 5.0000 mg | ORAL_TABLET | ORAL | 0 refills | Status: DC | PRN

## 2020-04-14 NOTE — Progress Notes (Signed)
IV removed without complication, site clean dry and intact. Discharge instructions given to patient with daughter Yolanda Bauer at the bedside. Patient verbalized understanding of follow up appointments and instructions for surgery care.

## 2020-04-14 NOTE — Progress Notes (Signed)
SATURATION QUALIFICATIONS: (This note is used to comply with regulatory documentation for home oxygen)  Patient Saturations on Room Air at Rest = 92%  Patient Saturations on Room Air while Ambulating = 84%  Patient Saturations on 2 Liters of oxygen while Ambulating = 91%  Please briefly explain why patient needs home oxygen: Pt required 2L of O2 to maintain appropriate saturations. Pt saturations decreasing to 84% on RA, and required 2L to return to >90%.   Reuel Derby, PT, DPT  Acute Rehabilitation Services  Pager: [phone] Office: [phone]

## 2020-04-14 NOTE — Progress Notes (Addendum)
      [address]       [place] [address]             [phone]      3 Days Post-Op Procedure(s) (LRB): XI ROBOTIC ASSISTED THORACOSCOPY-LEFT LOWER LOBE SUPERIOR SEGMENTECTOMY (Left) INTERCOSTAL NERVE BLOCK (Left) NODE DISSECTION (Left)   Subjective:  Sitting up on side of bed.  No complaints.  + ambulation   + BM  Objective: Vital signs in last 24 hours: Temp:  [97.6 F (36.4 C)-98.7 F (37.1 C)] 98 F (36.7 C) (12/09 0325) Pulse Rate:  [81-87] 85 (12/09 0325) Cardiac Rhythm: Normal sinus rhythm (12/08 1900) Resp:  [16-25] 20 (12/09 0325) BP: (140-158)/(54-72) 148/54 (12/09 0325) SpO2:  [88 %-99 %] 98 % (12/09 0325)  Intake/Output from previous day: 12/08 0701 - 12/09 0700 In: 780 [P.O.:780] Out: -   General appearance: alert, cooperative and no distress Heart: regular rate and rhythm Lungs: clear to auscultation bilaterally Abdomen: soft, non-tender; bowel sounds normal; no masses,  no organomegaly Extremities: extremities normal, atraumatic, no cyanosis or edema Wound: clean and dry  Lab Results: Recent Labs    04/12/20 0510 04/13/20 0022  WBC 10.1 10.2  HGB 11.0* 11.5*  HCT 34.5* 37.1  PLT 244 274   BMET:  Recent Labs    04/12/20 0510 04/13/20 0022  NA 137 136  K 3.8 3.9  CL 102 103  CO2 27 25  GLUCOSE 144* 122*  BUN 9 17  CREATININE 0.69 0.68  CALCIUM 8.5* 8.6*    PT/INR:  Recent Labs    04/11/20 1030  LABPROT 14.4  INR 1.2   ABG    Component Value Date/Time   PHART 7.383 04/07/2020 1334   HCO3 29.9 (H) 04/07/2020 1334   O2SAT 93.7 04/07/2020 1334   CBG (last 3)  No results for input(s): GLUCAP in the last 72 hours.  Assessment/Plan: S/P Procedure(s) (LRB): XI ROBOTIC ASSISTED THORACOSCOPY-LEFT LOWER LOBE SUPERIOR SEGMENTECTOMY (Left) INTERCOSTAL NERVE BLOCK (Left) NODE DISSECTION (Left)  1. CV- NSR, + HTN- continue home antihypertensives 2. Pulm- CXR with stable to improved apical space.. patient down  to 2L of oxygen, nursing will ambulate off oxygen to see if patient needs for discharge 3. Lovenox for DVT prophylaxis 4. Dispo- patient stable, nursing to ambulate off oxygen to see if patient requires at discharge, likely for d/c today    LOS: 3 days    Patient seen and examined, agree with above  Remo Lipps C. Roxan Hockey, MD [facility] [phone]    Ellwood Handler, PA-C  04/14/2020

## 2020-04-14 NOTE — TOC Transition Note (Signed)
Transition of Care [facility]) - CM/SW Discharge Note   Patient Details  Name: Yolanda Bauer MRN: [id number] Date of Birth: July 30, 1945  Transition of Care [facility]) CM/SW Contact:  Zenon Mayo, RN Phone Number: 04/14/2020, 11:39 AM   Clinical Narrative:    Patient is for dc today, NCM spoke with patient, she states [employer] is ok to supply her oxygen.  NCM made referral to [facility] with [employer]. This will be brought to patient room prior to discharge.   Final next level of care: Home/Self Care Barriers to Discharge: No Barriers Identified   Patient Goals and CMS Choice Patient states their goals for this hospitalization and ongoing recovery are:: get better   Choice offered to / list presented to : NA  Discharge Placement                       Discharge Plan and Services                DME Arranged: Oxygen   Date DME Agency Contacted: 04/14/20 Time DME Agency Contacted: [id number] Representative spoke with at DME Agency: Thedore Mins HH Arranged: NA          Social Determinants of Health (Omar) Interventions     Readmission Risk Interventions No flowsheet data found.

## 2020-04-14 NOTE — Evaluation (Signed)
Physical Therapy Evaluation Patient Details Name: Yolanda Bauer MRN: [id number] DOB: [birth year] Today's Date: 04/14/2020   History of Present Illness  Pt is a 74 y/o female s/p L lower lobe segemtectomy. PMH includes HTN and anxiety.  Clinical Impression  Pt admitted secondary to problem above with deficits below. Pt requiring min A for mobility without AD and min guard for mobility using RW. Educated about using RW at home to increase safety. Distance limited secondary to fatigue. Pt oxygen sats decreasing to 84% on RA and required 2L to return to >90%. Pt reports daughter can likely assist some at dc. Will continue to follow acutely.     Follow Up Recommendations Home health PT;Supervision for mobility/OOB    Equipment Recommendations  Rolling walker with 5" wheels;3in1 (PT)    Recommendations for Other Services       Precautions / Restrictions Precautions Precautions: Fall Restrictions Weight Bearing Restrictions: No      Mobility  Bed Mobility               General bed mobility comments: Sitting EOB upon entry.    Transfers Overall transfer level: Needs assistance Equipment used: None;Rolling walker (2 wheeled) Transfers: Sit to/from Stand Sit to Stand: Min assist;Min guard         General transfer comment: Min A to stand without AD and min guard for safety with use of AD.  Ambulation/Gait Ambulation/Gait assistance: Min guard;Min assist Gait Distance (Feet): 100 Feet Assistive device: Rolling walker (2 wheeled);None Gait Pattern/deviations: Step-through pattern;Decreased stride length Gait velocity: Decreased   General Gait Details: Pt unsteady without use of RW. Requiring min A. Steadiness improved with RW and pt only requiring min guard A for safety. Pt oxygen sats decreasing to 84% on RA, and required 2L to return to >90%.  Stairs            Wheelchair Mobility    Modified Rankin (Stroke Patients Only)       Balance Overall balance  assessment: Needs assistance Sitting-balance support: No upper extremity supported;Feet supported Sitting balance-Leahy Scale: Fair     Standing balance support: Bilateral upper extremity supported;During functional activity Standing balance-Leahy Scale: Poor Standing balance comment: Reliant on BUE support                             Pertinent Vitals/Pain Pain Assessment: No/denies pain    Home Living Family/patient expects to be discharged to:: Private residence Living Arrangements: Alone Available Help at Discharge: Family;Available 24 hours/day Type of Home: Apartment Home Access: Level entry     Home Layout: One level Home Equipment: Shower seat      Prior Function Level of Independence: Independent               Hand Dominance        Extremity/Trunk Assessment   Upper Extremity Assessment Upper Extremity Assessment: Generalized weakness    Lower Extremity Assessment Lower Extremity Assessment: Generalized weakness    Cervical / Trunk Assessment Cervical / Trunk Assessment: Normal  Communication   Communication: No difficulties  Cognition Arousal/Alertness: Awake/alert Behavior During Therapy: WFL for tasks assessed/performed Overall Cognitive Status: Within Functional Limits for tasks assessed                                        General Comments      Exercises  Assessment/Plan    PT Assessment Patient needs continued PT services  PT Problem List Decreased strength;Decreased balance;Decreased activity tolerance;Decreased mobility;Decreased knowledge of use of DME;Decreased knowledge of precautions       PT Treatment Interventions Gait training;DME instruction;Therapeutic activities;Functional mobility training;Balance training;Therapeutic exercise;Patient/family education    PT Goals (Current goals can be found in the Care Plan section)  Acute Rehab PT Goals Patient Stated Goal: to go home PT Goal  Formulation: With patient Time For Goal Achievement: 04/28/20 Potential to Achieve Goals: Good    Frequency Min 3X/week   Barriers to discharge        Co-evaluation               AM-PAC PT "6 Clicks" Mobility  Outcome Measure Help needed turning from your back to your side while in a flat bed without using bedrails?: None Help needed moving from lying on your back to sitting on the side of a flat bed without using bedrails?: A Little Help needed moving to and from a bed to a chair (including a wheelchair)?: A Little Help needed standing up from a chair using your arms (e.g., wheelchair or bedside chair)?: A Little Help needed to walk in hospital room?: A Little Help needed climbing 3-5 steps with a railing? : A Lot 6 Click Score: 18    End of Session   Activity Tolerance: Patient limited by fatigue Patient left: in bed;with call bell/phone within reach (sitting EOB) Nurse Communication: Mobility status PT Visit Diagnosis: Unsteadiness on feet (R26.81);Other abnormalities of gait and mobility (R26.89)    Time: 2542-7062 PT Time Calculation (min) (ACUTE ONLY): 15 min   Charges:   PT Evaluation $PT Eval Moderate Complexity: 1 Mod          Reuel Derby, PT, DPT  Acute Rehabilitation Services  Pager: [phone] Office: [phone]   Rudean Hitt 04/14/2020, 11:11 AM

## 2020-04-14 NOTE — Plan of Care (Signed)

## 2020-04-19 ENCOUNTER — Telehealth: Payer: Self-pay

## 2020-04-19 NOTE — Telephone Encounter (Signed)
Patient contacted the office concerned about her feet/ toes swelling.  She is s/p Left LL RATS segmentectomy with Dr. Jearld Pies 04/11/2020.  She stated that she was discharged from the hospital last Thursday.  She stated that the swelling started yesterday and has not gone down she is not currently elevating them when sitting.  Advised to keep her legs elevated when sitting and prop them up on pillows while sleeping at night.  Advised that she could also get compression stockings to wear during the day to help.  She acknowledged receipt.

## 2020-04-21 ENCOUNTER — Other Ambulatory Visit: Payer: Self-pay | Admitting: *Deleted

## 2020-04-21 NOTE — Progress Notes (Signed)
The proposed treatment discussed in cancer conference 04/21/20 is for discussion purpose only and is not a binding recommendation.  The patient was not physically examined nor present for their treatment options.  Therefore, final treatment plans cannot be decided.

## 2020-04-22 ENCOUNTER — Other Ambulatory Visit: Payer: Self-pay | Admitting: Thoracic Surgery (Cardiothoracic Vascular Surgery)

## 2020-04-22 DIAGNOSIS — R911 Solitary pulmonary nodule: Secondary | ICD-10-CM

## 2020-05-03 ENCOUNTER — Ambulatory Visit (INDEPENDENT_AMBULATORY_CARE_PROVIDER_SITE_OTHER): Payer: Self-pay | Admitting: Thoracic Surgery (Cardiothoracic Vascular Surgery)

## 2020-05-03 ENCOUNTER — Telehealth: Payer: Self-pay | Admitting: *Deleted

## 2020-05-03 ENCOUNTER — Ambulatory Visit
Admission: RE | Admit: 2020-05-03 | Discharge: 2020-05-03 | Disposition: A | Discharge status: Home / Self Care | Payer: Medicare Other | Source: Ambulatory Visit | Attending: Thoracic Surgery (Cardiothoracic Vascular Surgery) | Admitting: Thoracic Surgery (Cardiothoracic Vascular Surgery)

## 2020-05-03 ENCOUNTER — Other Ambulatory Visit: Payer: Self-pay

## 2020-05-03 VITALS — BP 160/70 | HR 80 | Temp 99.1°F | Resp 20 | Ht 62.0 in | Wt 155.0 lb

## 2020-05-03 DIAGNOSIS — Z902 Acquired absence of lung [part of]: Secondary | ICD-10-CM

## 2020-05-03 DIAGNOSIS — R911 Solitary pulmonary nodule: Secondary | ICD-10-CM

## 2020-05-03 NOTE — Progress Notes (Signed)
[address]       [place] [address]             [phone]     HPI: Ms. Doto returns for a scheduled postoperative follow up visit  Jordis Repetto is a 74 year old woman with a history of tobacco abuse (75 pack years), COPD, arthritis, pneumonia, hypertension, and hyperlipidemia.  She was found to have a 3.5 cm mass in the superior segment of the left lower lobe on a low-dose screening CT.  On PET CT that was markedly hypermetabolic.  I did a robotic left lower lobe superior segmentectomy and node dissection on 04/11/2020.  She was not a candidate for lobectomy due to her PFTs.  She did well postoperatively went home on day 3.  She did go home on 2 L of oxygen.  She is doing well.  She is not having any shortness of breath.  She has not tried going without oxygen.  She still has some incisional discomfort but is only using Tylenol for that.  She still has 5 oxycodone tablets left.  Past Medical History:  Diagnosis Date  . Anxiety   . Arthritis   . Dyspnea   . Hyperlipidemia   . Hypertension   . Pneumonia     Current Outpatient Medications  Medication Sig Dispense Refill  . acetaminophen (TYLENOL) 325 MG tablet Take 650 mg by mouth every 6 (six) hours as needed for moderate pain.     . Ascorbic Acid (VITAMIN C) 500 MG tablet Take 500 mg by mouth daily.    Marland Kitchen aspirin 81 MG tablet Take 81 mg by mouth daily.    Marland Kitchen Coral Calcium 1000 (390 CA) MG TABS Take 1,000 mg by mouth every other day.    . fluticasone (FLONASE) 50 MCG/ACT nasal spray Place 1 spray into both nostrils daily as needed for allergies.     Marland Kitchen guaiFENesin (MUCINEX) 600 MG 12 hr tablet Take 1 tablet (600 mg total) by mouth 2 (two) times daily.    Marland Kitchen loratadine (CLARITIN) 10 MG tablet Take 10 mg by mouth daily.    . metoprolol succinate (TOPROL-XL) 50 MG 24 hr tablet Take 50 mg by mouth daily. Take with or immediately following a meal.    . Multiple Vitamin (MULTIVITAMIN) tablet Take 1 tablet by mouth  every other day.    . olmesartan-hydrochlorothiazide (BENICAR HCT) 20-12.5 MG tablet Take 1 tablet by mouth daily. 30 tablet 11  . oxyCODONE (OXY IR/ROXICODONE) 5 MG immediate release tablet Take 1 tablet (5 mg total) by mouth every 4 (four) hours as needed for moderate pain. 30 tablet 0  . pravastatin (PRAVACHOL) 40 MG tablet Take 40 mg by mouth at bedtime.      No current facility-administered medications for this visit.    Physical Exam BP (!) 160/70 (BP Location: Right Arm, Patient Position: Sitting)   Pulse 80   Temp 99.1 F (37.3 C)   Resp 20   Ht 5\' 2"  (1.575 m)   Wt 155 lb (70.3 kg)   SpO2 97% Comment: 2 L O2  BMI 28.39 kg/m  74 year old woman in no acute distress Oriented x3 with no focal deficits Lungs diminished at left base but otherwise clear Incisions healing well No peripheral edema Cardiac regular rate and rhythm  Diagnostic Tests: CHEST - 2 VIEW  COMPARISON:  04/14/2020.  FINDINGS: Mediastinum and hilar structures are unremarkable. Heart size stable. Persistent but improved left lung infiltrate. Tiny left pleural  effusion. Very tiny residual left-sided pneumothorax cannot be completely excluded. Degenerative changes scoliosis thoracic spine. Interim resolution of left chest wall subcutaneous emphysema.  IMPRESSION: Persistent but improved left lung infiltrate. Tiny left pleural effusion. Very tiny residual left-sided pneumothorax cannot be completely excluded.   Electronically Signed   By: Marcello Moores  Register   On: 05/03/2020 09:24 I personally reviewed the chest x-ray images and concur with the findings noted above  Impression: Charnele Semple is a 74 year old woman with a history of tobacco abuse (75 pack years), COPD, arthritis, pneumonia, hypertension, and hyperlipidemia.    She underwent a robotic left lower lobe superior segmentectomy and node dissection on 04/11/2020.  Her postoperative course was unremarkable but she did go home on 2 L of  oxygen.  She is doing well.  She does have some incisional discomfort but is not requiring any narcotics.  I think she is okay to drive at this point.  Appropriate precautions were discussed.  There are no restrictions on her activities, but she was advised to build into new activities gradually.  She did have a large tumor at 3.9 cm.  It was a T2, N0, stage Ib squamous cell carcinoma.  A lobectomy would have been ideal, but she did not have adequate pulmonary function to support that.  I did a superior segmentectomy and the margins are clear but relatively close.  I am going to refer to our multidisciplinary thoracic oncology clinic for evaluation by oncology and radiation oncology to see if any adjuvant treatment is indicated.  Plan: Refer to [place] [facility] oxygen I will see her back in 2 months to check on her progress  [name], MD [facility] [phone]

## 2020-05-03 NOTE — Telephone Encounter (Signed)
I received referral on Ms. Yolanda Bauer today.  I called her to schedule for [name] next week. She verbalized understanding of appt time and place.

## 2020-05-03 NOTE — Telephone Encounter (Signed)
I received referral today on Yolanda Bauer.  I called but she was driving and would like me to call back around noon. I will call her back.

## 2020-05-12 ENCOUNTER — Other Ambulatory Visit: Payer: Self-pay

## 2020-05-12 ENCOUNTER — Other Ambulatory Visit: Payer: Self-pay | Admitting: *Deleted

## 2020-05-12 ENCOUNTER — Encounter: Payer: Self-pay | Admitting: Emergency Medicine

## 2020-05-12 ENCOUNTER — Ambulatory Visit
Admission: RE | Admit: 2020-05-12 | Discharge: 2020-05-12 | Disposition: A | Discharge status: Home / Self Care | Payer: Medicare Other | Source: Ambulatory Visit | Attending: Radiation Oncology | Admitting: Radiation Oncology

## 2020-05-12 ENCOUNTER — Inpatient Hospital Stay: Payer: Medicare Other | Attending: Internal Medicine | Admitting: Internal Medicine

## 2020-05-12 ENCOUNTER — Inpatient Hospital Stay: Payer: Medicare Other

## 2020-05-12 VITALS — BP 149/65 | HR 80 | Temp 98.5°F | Resp 18 | Wt 156.9 lb

## 2020-05-12 DIAGNOSIS — C3432 Malignant neoplasm of lower lobe, left bronchus or lung: Secondary | ICD-10-CM

## 2020-05-12 DIAGNOSIS — C3411 Malignant neoplasm of upper lobe, right bronchus or lung: Secondary | ICD-10-CM | POA: Diagnosis not present

## 2020-05-12 DIAGNOSIS — C3490 Malignant neoplasm of unspecified part of unspecified bronchus or lung: Secondary | ICD-10-CM

## 2020-05-12 DIAGNOSIS — Z902 Acquired absence of lung [part of]: Secondary | ICD-10-CM

## 2020-05-12 LAB — CBC WITH DIFFERENTIAL (CANCER CENTER ONLY)
Abs Immature Granulocytes: 0.01 10*3/uL (ref 0.00–0.07)
Basophils Absolute: 0 10*3/uL (ref 0.0–0.1)
Basophils Relative: 0 %
Eosinophils Absolute: 0.2 10*3/uL (ref 0.0–0.5)
Eosinophils Relative: 4 %
HCT: 38.2 % (ref 36.0–46.0)
Hemoglobin: 11.7 g/dL — ABNORMAL LOW (ref 12.0–15.0)
Immature Granulocytes: 0 %
Lymphocytes Relative: 28 %
Lymphs Abs: 1.5 10*3/uL (ref 0.7–4.0)
MCH: 26.8 pg (ref 26.0–34.0)
MCHC: 30.6 g/dL (ref 30.0–36.0)
MCV: 87.6 fL (ref 80.0–100.0)
Monocytes Absolute: 0.6 10*3/uL (ref 0.1–1.0)
Monocytes Relative: 12 %
Neutro Abs: 3.1 10*3/uL (ref 1.7–7.7)
Neutrophils Relative %: 56 %
Platelet Count: 311 10*3/uL (ref 150–400)
RBC: 4.36 MIL/uL (ref 3.87–5.11)
RDW: 17 % — ABNORMAL HIGH (ref 11.5–15.5)
WBC Count: 5.5 10*3/uL (ref 4.0–10.5)
nRBC: 0 % (ref 0.0–0.2)

## 2020-05-12 LAB — CMP (CANCER CENTER ONLY)
ALT: 10 U/L (ref 0–44)
AST: 14 U/L — ABNORMAL LOW (ref 15–41)
Albumin: 3.4 g/dL — ABNORMAL LOW (ref 3.5–5.0)
Alkaline Phosphatase: 72 U/L (ref 38–126)
Anion gap: 7 (ref 5–15)
BUN: 16 mg/dL (ref 8–23)
CO2: 33 mmol/L — ABNORMAL HIGH (ref 22–32)
Calcium: 9.8 mg/dL (ref 8.9–10.3)
Chloride: 101 mmol/L (ref 98–111)
Creatinine: 0.73 mg/dL (ref 0.44–1.00)
GFR, Estimated: >60 mL/min (ref >60)
Glucose, Bld: 128 mg/dL — ABNORMAL HIGH (ref 70–99)
Potassium: 3.7 mmol/L (ref 3.5–5.1)
Sodium: 141 mmol/L (ref 135–145)
Total Bilirubin: 0.4 mg/dL (ref 0.3–1.2)
Total Protein: 7.3 g/dL (ref 6.5–8.1)

## 2020-05-12 NOTE — Research (Signed)
[id number] - Customer service manager for the Discovery and Validation of Biomarkers for the Prediction, Diagnosis, and Management of Disease  05/12/20  Study Introduction:  Yolanda Bauer presented to the clinic for a new patient appointment. This Research officer, political party met with the patient in a private exam room. She was presented with the informed consent (version 2, revised 11/10/2019) and HIPAA (version 5, revised 04/22/2019) forms for the [facility] NSCLC specimen collection study. The patient was given information about the study and this research coordinator's contact information.    Plan:  Will follow up with the patient to see if she is interested in participating in the study.  Clabe Seal Clinical Research Coordinator I  05/12/20 2:59 PM

## 2020-05-12 NOTE — Progress Notes (Signed)
[place] [phone]   [phone] Multidisciplinary thoracic oncology clinic  CONSULT NOTE  REFERRING PHYSICIAN: Dr. Modesto Charon  REASON FOR CONSULTATION:  75 years old African-American female recently diagnosed with lung cancer.  HPI Yolanda Bauer is a 75 y.o. female with past medical history significant for hypertension, dyslipidemia, COPD, osteoarthritis, and anxiety and long history of smoking.  The patient mentions that she was seen by her primary care physician and CT screening of the chest was ordered for evaluation because of her long history of smoking.  The scan was performed on November 06, 2019 and it showed a suspicious lesion in the left lower lobe superior segment.  She was referred to Dr. Melvyn Novas and a PET scan was performed on February 15, 2020 and it showed 3.3 x 3.5 cm spiculated mass in the posterior left lower lobe abutting the chest wall.  There was no evidence for metastasis to the mediastinal lymph nodes or distant metastatic disease. The patient was referred to Dr. Roxan Hockey and on April 11, 2020 she underwent left lower lobe superior segmentectomy with mediastinal lymph node dissection. 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  The dissected lymph nodes were negative for malignancy.  Dr. Roxan Hockey kindly referred the patient to me today for evaluation and recommendation regarding treatment of her condition. When seen today she continues to have mild soreness on the left side of the chest.  She is currently on home oxygen 2 L/min after the surgery and she is trying to wean herself off oxygen.  She denied having any current cough or hemoptysis.  She has no recent weight loss or night sweats.  She denied having any nausea, vomiting, diarrhea or constipation. Family history significant for mother with stroke  and ovarian cancer.  Father died from alcoholic cirrhosis and complications.  She had an aunt with breast cancer and an uncle with colon cancer. The patient is divorced and has 3 children 2 sons and 1 daughter.  She used to work in Designer, fashion/clothing as well as daycare.  She has a history for smoking 2 pack/day for around 52 years.  She quit in November 2021.  She has no history of alcohol or drug abuse.  HPI  Past Medical History:  Diagnosis Date  . Anxiety   . Arthritis   . Dyspnea   . Hyperlipidemia   . Hypertension   . Pneumonia     Past Surgical History:  Procedure Laterality Date  . BACK SURGERY    . HERNIA REPAIR    . INTERCOSTAL NERVE BLOCK Left 04/11/2020   Procedure: INTERCOSTAL NERVE BLOCK;  Surgeon: [name], MD;  Location: [place];  Service: Thoracic;  Laterality: Left;  . NODE DISSECTION Left 04/11/2020   Procedure: NODE DISSECTION;  Surgeon: [name], MD;  Location: [place];  Service: Thoracic;  Laterality: Left;  . TUBAL LIGATION    . XI ROBOTIC ASSISTED THORACOSCOPY- SEGMENTECTOMY Left 04/11/2020   Procedure: XI ROBOTIC ASSISTED THORACOSCOPY-LEFT LOWER LOBE SUPERIOR SEGMENTECTOMY;  Surgeon: [name], MD;  Location: [place];  Service: Thoracic;  Laterality: Left;    No family history on file.  Social History Social History   Tobacco Use  . Smoking status: Former Smoker    Packs/day: 1.00    Years: 45.00    Pack years: 45.00    Types: Cigarettes    Quit date: 03/08/2020  Years since quitting: 0.1  . Smokeless tobacco: Never Used  . Tobacco comment: 3-5 cigarettes per day  Substance Use Topics  . Alcohol use: Never  . Drug use: Never    Allergies  Allergen Reactions  . Penicillins Hives    Current Outpatient Medications  Medication Sig Dispense Refill  . acetaminophen (TYLENOL) 325 MG tablet Take 650 mg by mouth every 6 (six) hours as needed for moderate pain.     . Ascorbic Acid (VITAMIN C) 500 MG tablet Take 500 mg by  mouth daily.    Marland Kitchen aspirin 81 MG tablet Take 81 mg by mouth daily.    Marland Kitchen Coral Calcium 1000 (390 CA) MG TABS Take 1,000 mg by mouth every other day.    . fluticasone (FLONASE) 50 MCG/ACT nasal spray Place 1 spray into both nostrils daily as needed for allergies.     Marland Kitchen guaiFENesin (MUCINEX) 600 MG 12 hr tablet Take 1 tablet (600 mg total) by mouth 2 (two) times daily.    Marland Kitchen loratadine (CLARITIN) 10 MG tablet Take 10 mg by mouth daily.    . metoprolol succinate (TOPROL-XL) 50 MG 24 hr tablet Take 50 mg by mouth daily. Take with or immediately following a meal.    . Multiple Vitamin (MULTIVITAMIN) tablet Take 1 tablet by mouth every other day.    . olmesartan-hydrochlorothiazide (BENICAR HCT) 20-12.5 MG tablet Take 1 tablet by mouth daily. 30 tablet 11  . oxyCODONE (OXY IR/ROXICODONE) 5 MG immediate release tablet Take 1 tablet (5 mg total) by mouth every 4 (four) hours as needed for moderate pain. 30 tablet 0  . pravastatin (PRAVACHOL) 40 MG tablet Take 40 mg by mouth at bedtime.      No current facility-administered medications for this visit.    Review of Systems  Constitutional: positive for fatigue Eyes: negative Ears, nose, mouth, throat, and face: negative Respiratory: positive for pleurisy/chest pain Cardiovascular: negative Gastrointestinal: negative Genitourinary:negative Integument/breast: negative Hematologic/lymphatic: negative Musculoskeletal:negative Neurological: negative Behavioral/Psych: negative Endocrine: negative Allergic/Immunologic: negative  Physical Exam  TGG:YIRSW, healthy, no distress, well nourished and well developed SKIN: skin color, texture, turgor are normal, no rashes or significant lesions HEAD: Normocephalic, No masses, lesions, tenderness or abnormalities EYES: normal, PERRLA, Conjunctiva are pink and non-injected EARS: External ears normal, Canals clear OROPHARYNX:no exudate, no erythema and lips, buccal mucosa, and tongue normal  NECK: supple,  no adenopathy, no JVD LYMPH:  no palpable lymphadenopathy, no hepatosplenomegaly BREAST:not examined LUNGS: clear to auscultation , and palpation HEART: regular rate & rhythm, no murmurs and no gallops ABDOMEN:abdomen soft, non-tender, normal bowel sounds and no masses or organomegaly BACK: No CVA tenderness, Range of motion is normal EXTREMITIES:no joint deformities, effusion, or inflammation, no edema  NEURO: alert & oriented x 3 with fluent speech, no focal motor/sensory deficits  PERFORMANCE STATUS: ECOG 1  LABORATORY DATA: Lab Results  Component Value Date   WBC 5.5 05/12/2020   HGB 11.7 (L) 05/12/2020   HCT 38.2 05/12/2020   MCV 87.6 05/12/2020   PLT 311 05/12/2020      Chemistry      Component Value Date/Time   NA 141 05/12/2020 1401   K 3.7 05/12/2020 1401   CL 101 05/12/2020 1401   CO2 33 (H) 05/12/2020 1401   BUN 16 05/12/2020 1401   CREATININE 0.73 05/12/2020 1401      Component Value Date/Time   CALCIUM 9.8 05/12/2020 1401   ALKPHOS 72 05/12/2020 1401   AST 14 (L) 05/12/2020 1401  ALT 10 05/12/2020 1401   BILITOT 0.4 05/12/2020 1401       RADIOGRAPHIC STUDIES: DG Chest 2 View  Result Date: 05/03/2020 CLINICAL DATA:  History of lung nodule. EXAM: CHEST - 2 VIEW COMPARISON:  04/14/2020. FINDINGS: Mediastinum and hilar structures are unremarkable. Heart size stable. Persistent but improved left lung infiltrate. Tiny left pleural effusion. Very tiny residual left-sided pneumothorax cannot be completely excluded. Degenerative changes scoliosis thoracic spine. Interim resolution of left chest wall subcutaneous emphysema. IMPRESSION: Persistent but improved left lung infiltrate. Tiny left pleural effusion. Very tiny residual left-sided pneumothorax cannot be completely excluded. Electronically Signed   By: Marcello Bauer  Register   On: 05/03/2020 09:24   DG Chest 2 View  Result Date: 04/14/2020 CLINICAL DATA:  Chest tube removal. EXAM: CHEST - 2 VIEW COMPARISON:   April 13, 2020. FINDINGS: The heart size and mediastinal contours are within normal limits. Stable minimal left apical pneumothorax is noted. Right lung is clear. Stable left perihilar and basilar opacities are noted with probable small bilateral pleural effusions. The visualized skeletal structures are unremarkable. IMPRESSION: Stable minimal left apical pneumothorax. Stable left perihilar and basilar opacities are noted with probable small bilateral pleural effusions. Aortic Atherosclerosis (ICD10-I70.0). Electronically Signed   By: Marijo Conception M.D.   On: 04/14/2020 08:10   DG Chest 1V REPEAT Same Day  Result Date: 04/13/2020 CLINICAL DATA:  Chest tube removal EXAM: CHEST - 1 VIEW SAME DAY COMPARISON:  04/13/2020, CT 11/05/2019, radiograph 04/11/2020, 04/12/2020 FINDINGS: Interval removal of left-sided chest tube. Probable trace left apical pneumothorax. Small bilateral effusions. Slightly improved aeration at left base. Residual ground-glass opacities and patchy consolidations at the left base. Stable cardiomediastinal silhouette with aortic atherosclerosis. Decreased chest wall emphysema. IMPRESSION: 1. Interval removal of left-sided chest tube with suspected tiny left apical pneumothorax. 2. Small bilateral pleural effusions with continued left mid to lower lung ground-glass opacity and patchy consolidations. Improvement in aeration at left lung base compared to prior. Electronically Signed   By: Donavan Foil M.D.   On: 04/13/2020 16:03   DG Chest Port 1 View  Result Date: 04/13/2020 CLINICAL DATA:  Status post left lobectomy. EXAM: PORTABLE CHEST 1 VIEW COMPARISON:  April 12, 2020. FINDINGS: Stable cardiomediastinal silhouette. Left-sided chest tube is unchanged in position. No definite pneumothorax is noted currently. Stable left midlung and basilar atelectasis is noted. Minimal right basilar subsegmental atelectasis is noted. Bony thorax is unremarkable. IMPRESSION: Stable left-sided chest  tube. No definite pneumothorax is noted currently. Stable left midlung and basilar atelectasis. Minimal right basilar subsegmental atelectasis. Aortic Atherosclerosis (ICD10-I70.0). Electronically Signed   By: Marijo Conception M.D.   On: 04/13/2020 08:15    ASSESSMENT: This is a very pleasant 75 years old African-American female recently diagnosed with a stage IB (T2 a, N0, M0) non-small cell lung cancer, squamous cell carcinoma status post left lower lobe superior segmentectomy with lymph node dissection under the care of Dr. Roxan Hockey on March 12, 2020 and the final size of the tumor was 3.9 cm with visceral pleural invasion.   PLAN: I had a lengthy discussion with the patient today about her current disease stage, prognosis and treatment options. I explained to the patient that there is no survival benefit for adjuvant systemic chemotherapy for patient with stage IB non-small cell lung cancer if the tumor size is less than 4.0 cm and the current standard of care is observation and close monitoring but I also discussed with the patient the option of adjuvant systemic  chemotherapy. The patient agreed to continue on observation at this point. I will arrange for her to have repeat CT scan of the chest in 6 months. I strongly encouraged her to continue with the smoking cessation. The patient was advised to call immediately if she has any concerning symptoms in the interval. The patient voices understanding of current disease status and treatment options and is in agreement with the current care plan.  All questions were answered. The patient knows to call the clinic with any problems, questions or concerns. We can certainly see the patient much sooner if necessary.  Thank you so much for allowing me to participate in the care of Yolanda Bauer. I will continue to follow up the patient with you and assist in her care.  The total time spent in the appointment was 80 minutes.  Disclaimer: This note  was dictated with voice recognition software. Similar sounding words can inadvertently be transcribed and may not be corrected upon review.   Eilleen Kempf May 12, 2020, 3:01 PM

## 2020-05-12 NOTE — Progress Notes (Signed)
The proposed treatment discussed in conference is for discussion purpose only and is not a binding recommendation.  The patient was not physically examined nor present for their treatment options.  Therefore, final treatment plans cannot be decided.

## 2020-05-12 NOTE — Progress Notes (Signed)
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    ICD-10-CM   1. 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 female who is seen as a courtesy of Dr. Roxan Hockey for an opinion concerning radiation therapy as part of management for her recently diagnosed lung cancer. Today, she is accompanied by no one. The patient presented to Dr. Melvyn Novas on 01/27/2020 for evaluation of productive cough with white phlegm as well follow-up of left lower lobe lung nodule seen on 11/25/2019 chest CT scan.  PET scan was performed on 02/15/2020 and showed a 3.5 cm mass in the posterior left lower lobe that abutted the chest wall and corresponded to the patient's suspected primary bronchogenic neoplasm. There was also noted to be a 17 mm low-density right adrenal nodule that was favored to be a benign adrenal adenoma. There were no findings suspicious for metastatic disease.  Given the above findings, the patient was referred to Dr. Roxan Hockey and underwent a robotic-assisted left thoracoscopy, a left lower lobe superior segmentectomy, and mediastinal lymph node dissection. 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 Visceral pleura invasion was identified. Margins of resection were not involved. Twelve lymph nodes were biopsied, all of which were negative for carcinoma.  The patient was referred today for presentation in the multidisciplinary  conference.  Radiology studies and pathology slides were presented there for review and discussion of treatment options.  A consensus was discussed regarding potential next steps.  PREVIOUS RADIATION THERAPY: No  PAST MEDICAL HISTORY:  has a past medical history of Anxiety, Arthritis, Dyspnea, Hyperlipidemia, Hypertension, and Pneumonia.    PAST SURGICAL HISTORY: Past Surgical History:  Procedure Laterality Date  . BACK SURGERY    . HERNIA REPAIR    . INTERCOSTAL NERVE BLOCK Left 04/11/2020   Procedure: INTERCOSTAL NERVE BLOCK;  Surgeon: [name], MD;  Location: [place];  Service: Thoracic;  Laterality: Left;  . NODE DISSECTION Left 04/11/2020   Procedure: NODE DISSECTION;  Surgeon: [name], MD;  Location: [place];  Service: Thoracic;  Laterality: Left;  . TUBAL LIGATION    . XI ROBOTIC ASSISTED THORACOSCOPY- SEGMENTECTOMY Left 04/11/2020   Procedure: XI ROBOTIC ASSISTED THORACOSCOPY-LEFT LOWER LOBE SUPERIOR SEGMENTECTOMY;  Surgeon: [name], MD;  Location: [place];  Service: Thoracic;  Laterality: Left;    FAMILY HISTORY: family history is not on file.  SOCIAL HISTORY:  reports that she quit smoking about 2 months ago. Her smoking use included cigarettes. She has a 45.00 pack-year smoking history. She has never used smokeless tobacco. She reports that she does not drink alcohol and does not use drugs.  ALLERGIES: Penicillins  MEDICATIONS:  Current Outpatient Medications  Medication Sig Dispense Refill  . acetaminophen (TYLENOL) 325 MG tablet Take 650 mg by mouth every 6 (six) hours as needed for moderate pain.     . Ascorbic Acid (VITAMIN C) 500 MG tablet Take 500 mg by mouth daily.    Marland Kitchen  aspirin 81 MG tablet Take 81 mg by mouth daily.    Marland Kitchen Coral Calcium 1000 (390 CA) MG TABS Take 1,000 mg by mouth every other day.    . fluticasone (FLONASE) 50 MCG/ACT nasal spray Place 1 spray into both nostrils daily as needed for allergies.     Marland Kitchen guaiFENesin  (MUCINEX) 600 MG 12 hr tablet Take 1 tablet (600 mg total) by mouth 2 (two) times daily.    Marland Kitchen loratadine (CLARITIN) 10 MG tablet Take 10 mg by mouth daily.    . metoprolol succinate (TOPROL-XL) 50 MG 24 hr tablet Take 50 mg by mouth daily. Take with or immediately following a meal.    . Multiple Vitamin (MULTIVITAMIN) tablet Take 1 tablet by mouth every other day.    . olmesartan-hydrochlorothiazide (BENICAR HCT) 20-12.5 MG tablet Take 1 tablet by mouth daily. 30 tablet 11  . oxyCODONE (OXY IR/ROXICODONE) 5 MG immediate release tablet Take 1 tablet (5 mg total) by mouth every 4 (four) hours as needed for moderate pain. 30 tablet 0  . pravastatin (PRAVACHOL) 40 MG tablet Take 40 mg by mouth at bedtime.      No current facility-administered medications for this encounter.    REVIEW OF SYSTEMS:  REVIEW OF SYSTEMS: A 10+ POINT REVIEW OF SYSTEMS WAS OBTAINED including neurology, dermatology, psychiatry, cardiac, respiratory, lymph, extremities, GI, GU, musculoskeletal, constitutional, reproductive, HEENT. All pertinent positives are noted in the HPI. All others are negative.  She denies any pain within the chest or breathing problems at this time.  She has mild soreness along the left lateral chest wall from her surgery   PHYSICAL EXAM:  weight is 156 lb 14.4 oz (71.2 kg). Her temperature is 98.5 F (36.9 C). Her blood pressure is 149/65 (abnormal) and her pulse is 80. Her respiration is 18 and oxygen saturation is 99%.   General: Alert and oriented, in no acute distress, supplemental oxygen in place at this time HEENT: Head is normocephalic. Extraocular movements are intact.  Neck: Neck is supple, no palpable cervical or supraclavicular lymphadenopathy. Heart: Regular in rate and rhythm with no murmurs, rubs, or gallops. Chest: Clear to auscultation bilaterally, with no rhonchi, wheezes, or rales.  Small scar along the lateral lower chest wall area from recent surgery.  No signs of drainage or  infection Abdomen: Soft, nontender, nondistended, with no rigidity or guarding. Extremities: No cyanosis or edema. Lymphatics: see Neck Exam Skin: No concerning lesions. Musculoskeletal: symmetric strength and muscle tone throughout. Neurologic: Cranial nerves II through XII are grossly intact. No obvious focalities. Speech is fluent. Coordination is intact. Psychiatric: Judgment and insight are intact. Affect is appropriate.    KPS = 1  100 - Normal; no complaints; no evidence of disease. 90   - Able to carry on normal activity; minor signs or symptoms of disease. 80   - Normal activity with effort; some signs or symptoms of disease. 31   - Cares for self; unable to carry on normal activity or to do active work. 60   - Requires occasional assistance, but is able to care for most of his personal needs. 50   - Requires considerable assistance and frequent medical care. 70   - Disabled; requires special care and assistance. 83   - Severely disabled; hospital admission is indicated although death not imminent. 48   - Very sick; hospital admission necessary; active supportive treatment necessary. 10   - Moribund; fatal processes progressing rapidly. 0     - Dead  Karnofsky DA, Abelmann WH, Craver LS and [facility] : [facility] [phone]) The use of the nitrogen mustards in the palliative treatment of carcinoma: with particular reference to bronchogenic carcinoma Cancer 1 634-56  LABORATORY DATA:  Lab Results  Component Value Date   WBC 5.5 05/12/2020   HGB 11.7 (L) 05/12/2020   HCT 38.2 05/12/2020   MCV 87.6 05/12/2020   PLT 311 05/12/2020   Lab Results  Component Value Date   NA 141 05/12/2020   K 3.7 05/12/2020   CL 101 05/12/2020   CO2 33 (H) 05/12/2020   Lab Results  Component Value Date   ALT 10 05/12/2020   AST 14 (L) 05/12/2020   ALKPHOS 72 05/12/2020   BILITOT 0.4 05/12/2020    PULMONARY FUNCTION TEST:   Recent Review Flowsheet Data    Spirometry Latest Ref Rng & Units  03/15/2020   FVC-%PRED-PRE % 53   FVC-%PRED-POST % 62   FEV1-PRE L 0.87   FEV1-%PRED-PRE % 43   FEV1-POST L 0.90   FEV1-%PRED-POST % 44   DLCO [facility] ml/min/mmHg 15.22      RADIOGRAPHY: DG Chest 2 View  Result Date: 05/03/2020 CLINICAL DATA:  History of lung nodule. EXAM: CHEST - 2 VIEW COMPARISON:  04/14/2020. FINDINGS: Mediastinum and hilar structures are unremarkable. Heart size stable. Persistent but improved left lung infiltrate. Tiny left pleural effusion. Very tiny residual left-sided pneumothorax cannot be completely excluded. Degenerative changes scoliosis thoracic spine. Interim resolution of left chest wall subcutaneous emphysema. IMPRESSION: Persistent but improved left lung infiltrate. Tiny left pleural effusion. Very tiny residual left-sided pneumothorax cannot be completely excluded. Electronically Signed   By: Marcello Moores  Register   On: 05/03/2020 09:24   DG Chest 2 View  Result Date: 04/14/2020 CLINICAL DATA:  Chest tube removal. EXAM: CHEST - 2 VIEW COMPARISON:  April 13, 2020. FINDINGS: The heart size and mediastinal contours are within normal limits. Stable minimal left apical pneumothorax is noted. Right lung is clear. Stable left perihilar and basilar opacities are noted with probable small bilateral pleural effusions. The visualized skeletal structures are unremarkable. IMPRESSION: Stable minimal left apical pneumothorax. Stable left perihilar and basilar opacities are noted with probable small bilateral pleural effusions. Aortic Atherosclerosis (ICD10-I70.0). Electronically Signed   By: Marijo Conception M.D.   On: 04/14/2020 08:10   DG Chest 1V REPEAT Same Day  Result Date: 04/13/2020 CLINICAL DATA:  Chest tube removal EXAM: CHEST - 1 VIEW SAME DAY COMPARISON:  04/13/2020, CT 11/05/2019, radiograph 04/11/2020, 04/12/2020 FINDINGS: Interval removal of left-sided chest tube. Probable trace left apical pneumothorax. Small bilateral effusions. Slightly improved aeration at left  base. Residual ground-glass opacities and patchy consolidations at the left base. Stable cardiomediastinal silhouette with aortic atherosclerosis. Decreased chest wall emphysema. IMPRESSION: 1. Interval removal of left-sided chest tube with suspected tiny left apical pneumothorax. 2. Small bilateral pleural effusions with continued left mid to lower lung ground-glass opacity and patchy consolidations. Improvement in aeration at left lung base compared to prior. Electronically Signed   By: Donavan Foil M.D.   On: 04/13/2020 16:03   DG Chest Port 1 View  Result Date: 04/13/2020 CLINICAL DATA:  Status post left lobectomy. EXAM: PORTABLE CHEST 1 VIEW COMPARISON:  April 12, 2020. FINDINGS: Stable cardiomediastinal silhouette. Left-sided chest tube is unchanged in position. No definite pneumothorax is noted currently. Stable left midlung and basilar atelectasis is noted. Minimal right basilar subsegmental atelectasis is noted. Bony thorax is unremarkable. IMPRESSION: Stable left-sided chest tube. No definite pneumothorax is noted currently. Stable  left midlung and basilar atelectasis. Minimal right basilar subsegmental atelectasis. Aortic Atherosclerosis (ICD10-I70.0). Electronically Signed   By: Marijo Conception M.D.   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  Surgical margins are clear and no evidence of spread to multiple lymph nodes sampled. (pT2a, pN0)  I discussed with the patient that there are no strong indications for postop radiation therapy in this situation.  She is happy to hear this finding.  She will meet with Dr. Julien Nordmann later this afternoon to discuss possible adjuvant treatment.  PLAN: As needed follow-up in radiation oncology.  The patient will continue regular follow-ups with thoracic surgery in light of her recent  operation.   ------------------------------------------------  Blair Promise, PhD, MD  This document serves as a record of services personally performed by Gery Pray, MD. It was created on his behalf by Clerance Lav, a trained medical scribe. The creation of this record is based on the scribe's personal observations and the provider's statements to them. This document has been checked and approved by the attending provider.

## 2020-05-13 ENCOUNTER — Encounter: Payer: Self-pay | Admitting: *Deleted

## 2020-05-13 NOTE — Progress Notes (Signed)
  Treatment Summary/Recommendation: Follow up in six months with CT Chest scan and an appointment with Dr. Julien Nordmann  Next Steps: You will receive a call from [facility] for an appointment for lab work before your CT Chest scan and an appointment with Dr. Julien Nordmann.  Central Scheduling will call with an appointment for your CT Chest scan in a few months.    If you have any questions or concerns, please call the [facility] at [phone]

## 2020-05-17 ENCOUNTER — Telehealth: Payer: Self-pay | Admitting: Internal Medicine

## 2020-05-17 NOTE — Telephone Encounter (Signed)
Scheduled per 1/6 los. Called and spoke with pt, confirmed 7/6 and 7/7 appts

## 2020-05-27 ENCOUNTER — Telehealth: Payer: Self-pay | Admitting: *Deleted

## 2020-05-27 NOTE — Telephone Encounter (Signed)
Per Dr. Julien Nordmann I notified pathology to send PDL 1 on recent pathology.

## 2020-06-09 ENCOUNTER — Other Ambulatory Visit: Payer: Self-pay | Admitting: *Deleted

## 2020-06-09 MED ORDER — ALBUTEROL SULFATE HFA 108 (90 BASE) MCG/ACT IN AERS: 2.0000 | INHALATION_SPRAY | Freq: Four times a day (QID) | RESPIRATORY_TRACT | 0 refills | Status: DC | PRN

## 2020-06-09 NOTE — Progress Notes (Signed)
[name] home health nurse Yolanda Bauer contacted the office regarding weaning Yolanda Bauer from O2. [name] oxygen sat on 2L are 97-98%, off oxygen at rest 94%, off oxygen with exertion 86-88%. Per Dr. Roxan Hockey, okay for pt to drop down to 1L. Yolanda Bauer also states she hears wheezing in pt's LLL & LUL. Per Dr. Roxan Hockey, albuterol inhaler sent to pt's preferred pharmacy. Pt instructed she needs to f/u with Dr. Melvyn Novas about wheezing and occasional SOB. Pt verbalized understanding.

## 2020-06-27 ENCOUNTER — Other Ambulatory Visit: Payer: Self-pay | Admitting: Thoracic Surgery (Cardiothoracic Vascular Surgery)

## 2020-06-27 DIAGNOSIS — C3432 Malignant neoplasm of lower lobe, left bronchus or lung: Secondary | ICD-10-CM

## 2020-06-28 ENCOUNTER — Encounter: Payer: Self-pay | Admitting: Thoracic Surgery (Cardiothoracic Vascular Surgery)

## 2020-07-11 ENCOUNTER — Encounter: Payer: Self-pay | Admitting: Internal Medicine

## 2020-07-19 ENCOUNTER — Ambulatory Visit: Payer: Medicare Other | Admitting: Thoracic Surgery (Cardiothoracic Vascular Surgery)

## 2020-07-19 ENCOUNTER — Ambulatory Visit
Admission: RE | Admit: 2020-07-19 | Discharge: 2020-07-19 | Disposition: A | Discharge status: Home / Self Care | Payer: Medicare Other | Source: Ambulatory Visit | Attending: Thoracic Surgery (Cardiothoracic Vascular Surgery) | Admitting: Thoracic Surgery (Cardiothoracic Vascular Surgery)

## 2020-07-19 ENCOUNTER — Other Ambulatory Visit: Payer: Self-pay

## 2020-07-19 ENCOUNTER — Encounter: Payer: Self-pay | Admitting: Thoracic Surgery (Cardiothoracic Vascular Surgery)

## 2020-07-19 VITALS — BP 189/78 | HR 76 | Temp 98.1°F | Resp 20 | Ht 62.0 in | Wt 160.0 lb

## 2020-07-19 DIAGNOSIS — Z902 Acquired absence of lung [part of]: Secondary | ICD-10-CM

## 2020-07-19 DIAGNOSIS — C3432 Malignant neoplasm of lower lobe, left bronchus or lung: Secondary | ICD-10-CM

## 2020-07-19 NOTE — Progress Notes (Signed)
[address]       [place] [address]             [phone]     HPI: Yolanda Bauer returns for a scheduled postoperative follow-up visit  Yolanda Bauer is a 75 year old woman with a history of tobacco abuse, COPD, arthritis, pneumonia, hypertension, and hyperlipidemia.  She was found to have a 3.5 cm mass in the left lower lobe on a low-dose screening CT.  That mass was hypermetabolic on PET.  I did a robotic left lower lobe superior segmentectomy on 04/11/2020.  She did not have adequate pulmonary reserve for lobectomy.  Postoperatively she did well and went home on day 3.  She did go home on 2 L of oxygen.  She feels well.  She is using oxygen occasionally.  She says she does not use it every day.  She does note some wheezing from time to time.  She uses an inhaler for that.  She quit smoking about a month before her surgery.  Past Medical History:  Diagnosis Date  . Anxiety   . Arthritis   . Dyspnea   . Hyperlipidemia   . Hypertension   . Pneumonia     Current Outpatient Medications  Medication Sig Dispense Refill  . acetaminophen (TYLENOL) 325 MG tablet Take 650 mg by mouth every 6 (six) hours as needed for moderate pain.     Marland Kitchen albuterol (VENTOLIN HFA) 108 (90 Base) MCG/ACT inhaler Inhale 2 puffs into the lungs every 6 (six) hours as needed for wheezing. 8 g 0  . Ascorbic Acid (VITAMIN C) 500 MG tablet Take 500 mg by mouth daily.    Marland Kitchen aspirin 81 MG tablet Take 81 mg by mouth daily.    Marland Kitchen Coral Calcium 1000 (390 CA) MG TABS Take 1,000 mg by mouth every other day.    . fluticasone (FLONASE) 50 MCG/ACT nasal spray Place 1 spray into both nostrils daily as needed for allergies.     Marland Kitchen guaiFENesin (MUCINEX) 600 MG 12 hr tablet Take 1 tablet (600 mg total) by mouth 2 (two) times daily.    Marland Kitchen loratadine (CLARITIN) 10 MG tablet Take 10 mg by mouth daily.    . metoprolol succinate (TOPROL-XL) 50 MG 24 hr tablet Take 50 mg by mouth daily. Take with or immediately  following a meal.    . Multiple Vitamin (MULTIVITAMIN) tablet Take 1 tablet by mouth every other day.    . olmesartan-hydrochlorothiazide (BENICAR HCT) 20-12.5 MG tablet Take 1 tablet by mouth daily. 30 tablet 11  . oxyCODONE (OXY IR/ROXICODONE) 5 MG immediate release tablet Take 1 tablet (5 mg total) by mouth every 4 (four) hours as needed for moderate pain. 30 tablet 0  . pravastatin (PRAVACHOL) 40 MG tablet Take 40 mg by mouth at bedtime.      No current facility-administered medications for this visit.    Physical Exam BP (!) 189/78 (BP Location: Left Arm, Patient Position: Sitting, Cuff Size: Normal)   Pulse 76   Temp 98.1 F (36.7 C)   Resp 20   Ht 5\' 2"  (1.575 m)   Wt 160 lb (72.6 kg)   SpO2 96% Comment: 2LNC  BMI 29.26 kg/m  Well-appearing 75 year old woman in no acute distress Alert and oriented x3 with no focal deficits Lungs clear with equal breath sounds bilaterally Cardiac regular rate and rhythm  Diagnostic Tests: CHEST - 2 VIEW  COMPARISON:  Radiograph 05/03/2020  FINDINGS: Normal cardiac silhouette.  Ectatic aorta with calcification. No effusion, infiltrate or pneumothorax. No pulmonary nodularity.  Degenerative osteophytosis of the spine.  IMPRESSION: 1. No acute cardiopulmonary findings. 2.  Atherosclerotic calcification of the aorta.   Electronically Signed   By: Suzy Bouchard M.D.   On: 07/19/2020 12:37  I personally reviewed the chest x-ray images.  There is been resolution of the left midlung infiltrate.  Impression: Yolanda Bauer is a 74 year old woman with a history of tobacco abuse, COPD, arthritis, pneumonia, hypertension, and hyperlipidemia.  She underwent a robotic left lower lobe superior segmentectomy for stage Ib (T2, N0) squamous cell carcinoma in December 2021.  She is doing well postoperatively.  She did go home on oxygen and still uses it occasionally.    COPD -she has not yet followed up with Dr. Melvyn Novas since her  surgery.  She saw Dr. Julien Nordmann.  No adjuvant therapy was indicated.  She will be followed per protocol.  Tobacco abuse-quit smoking a month prior to surgery.  Has not smoked since.  I congratulated her for that and emphasized the importance of continued abstinence.  Plan: Follow-up with Dr. Melvyn Novas and Dr. Julien Nordmann I will see her back in 9 months for 1 year follow-up  [name], MD [facility] [phone]

## 2020-07-27 ENCOUNTER — Other Ambulatory Visit: Payer: Self-pay | Admitting: Internal Medicine

## 2020-07-27 MED ORDER — OLMESARTAN MEDOXOMIL-HCTZ 20-12.5 MG PO TABS
1.0000 | ORAL_TABLET | Freq: Every day | ORAL | 1 refills | Status: DC
Start: 2020-07-27 — End: 2020-12-17

## 2020-07-28 ENCOUNTER — Encounter: Payer: Self-pay | Admitting: Internal Medicine

## 2020-07-28 ENCOUNTER — Ambulatory Visit: Payer: Medicare Other | Admitting: Internal Medicine

## 2020-07-28 ENCOUNTER — Other Ambulatory Visit: Payer: Self-pay

## 2020-07-28 DIAGNOSIS — J9611 Chronic respiratory failure with hypoxia: Secondary | ICD-10-CM | POA: Insufficient documentation

## 2020-07-28 DIAGNOSIS — R911 Solitary pulmonary nodule: Secondary | ICD-10-CM | POA: Diagnosis not present

## 2020-07-28 DIAGNOSIS — J449 Chronic obstructive pulmonary disease, unspecified: Secondary | ICD-10-CM | POA: Diagnosis not present

## 2020-07-28 MED ORDER — TRELEGY ELLIPTA 100-62.5-25 MCG/INH IN AEPB: 1.0000 | INHALATION_SPRAY | Freq: Every day | RESPIRATORY_TRACT | 0 refills | Status: DC

## 2020-07-28 NOTE — Patient Instructions (Signed)
Plan A = Automatic = Always=    Trelegy 100 each am   Work on inhaler technique:  relax and gently blow all the way out then take a nice smooth deep breath back in,  Hold for up to 5 seconds if you can. Blow out thru nose. Rinse and gargle with water when done     Plan B = Backup (to supplement plan A, not to replace it) Only use your albuterol inhaler as a rescue medication to be used if you can't catch your breath by resting or doing a relaxed purse lip breathing pattern.  - The less you use it, the better it will work when you need it. - Ok to use the inhaler up to 2 puffs  every 4 hours if you must but call for appointment if use goes up over your usual need - Don't leave home without it !!  (think of it like the spare tire for your car)    Continue 02 is 2lpm at bedtime and then during the day keep over  90%   Make sure you check your oxygen saturation  at your highest level of activity  to be sure it stays over 90% and adjust  02 flow upward to maintain this level if needed but remember to turn it back to previous settings when you stop (to conserve your supply).    Please schedule a follow up office visit in 6 weeks, call sooner if needed

## 2020-07-28 NOTE — Assessment & Plan Note (Signed)
As of surgery 04/11/20 02 2lpm  07/28/2020   Walked RA  approx   400 ft  @ mod fast pace  stopped due to  Sob/ sats 90%     As of 07/28/2020 rec 2lpm hs and prn daytime with sat goal > 90%   Advised: Make sure you check your oxygen saturation  at your highest level of activity  to be sure it stays over 90% and adjust  02 flow upward to maintain this level if needed but remember to turn it back to previous settings when you stop (to conserve your supply).    F/u in 6 weeks   Each maintenance medication was reviewed in detail including emphasizing most importantly the difference between maintenance and prns and under what circumstances the prns are to be triggered using an action plan format where appropriate.  Total time for H and P, chart review, counseling, reviewing elipta device(s) , directly observing portions of ambulatory 02 saturation study/ and generating customized AVS unique to this office visit / same day charting  > 30 min

## 2020-07-28 NOTE — Assessment & Plan Note (Signed)
Quit smoking 03/15/2020 - PFT's  03/15/20  FEV1 0.90 (44 % ) ratio 0.54  p 3 % improvement from saba p 0 prior to study with DLCO  12.22 (84%) corrects to 3.59 (85%)  for alv volume and FV curve classic concave contour on exp portion   - 07/28/2020  After extensive coaching inhaler device,  effectiveness =    0 with hfa - 07/28/2020  After extensive coaching inhaler device,  effectiveness =    75% with elipta with short ti > try trelegy    Group D in terms of symptom/risk and laba/lama/ICS  therefore appropriate rx at this point >>>  trelegy 100 for now, perhaps step down to anoro depending on response and whether any tendency to AB off cigs   Re saba; I spent extra time with pt today reviewing appropriate use of albuterol for prn use on exertion with the following points: 1) saba is for relief of sob that does not improve by walking a slower pace or resting but rather if the pt does not improve after trying this first. 2) If the pt is convinced, as many are, that saba helps recover from activity faster then it's easy to tell if this is the case by re-challenging : ie stop, take the inhaler, then p 5 minutes try the exact same activity (intensity of workload) that just caused the symptoms and see if they are substantially diminished or not after saba 3) if there is an activity that reproducibly causes the symptoms, try the saba 15 min before the activity on alternate days   If in fact the saba really does help, then fine to continue to use it prn but advised may need to look closer at the maintenance regimen being used to achieve better control of airways disease with exertion.

## 2020-07-28 NOTE — Progress Notes (Signed)
Yolanda Bauer, female    DOB: February 08, 1946, 75 y.o.   MRN: [id number]   Brief patient profile:  74 yobf quit smoking 03/2020 with hbp on ACEi with noct "wheeze" on prn albuterol helped some referred to pulmonary clinic in [place]  01/27/2020 by Dr Denny Levy PA re SPN  LLL sup segment.    History of Present Illness  01/27/2020  Pulmonary/ 1st office eval/Kyndahl Jablon  Chief Complaint  Patient presents with  . Consult    productive cough with white phlegm in the mornings  Dyspnea:  No steps at her home but Not limited by breathing from desired activities   Cough: some am's / no hemoptysis/ some "wheeze at hs  Sleep: bed is flat/ one pillow  SABA use: none 02 none   rec Stop lisinopril and instead take olmesartan 20- 12.5 one daily  The key is to stop smoking completely before smoking completely stops you! My office will call you to schedule you a PET scan next available and I will call you with the results   04/11/20 L superior segmentectomy [facility]):  T2, N0, stage Ib squamous cell carcinoma.    07/28/2020  f/u ov/[place] office/Ronni Osterberg re: s/p L sup  Segmentectomy  Chief Complaint  Patient presents with  . Follow-up    Patient states has "shortness of breath sometimes but not as much as she used to"   Essential hypertension D/c acei 01/27/2020 due to report of noct "wheeze"   Dyspnea: able to shop [employer] sometimes s 02 /can't name one activity  Cough: nasal congestion  Sleeping: R side down/ bed is flat/ 2 pillows  SABA use: saba rarely  02: 2 lpm  Covid status: vax x 3 Lung cancer screening: n/a    No obvious day to day or daytime variability or assoc excess/ purulent sputum or mucus plugs or hemoptysis or cp or chest tightness, subjective wheeze or overt sinus or hb symptoms.   Sleeping  without nocturnal  or early am exacerbation  of respiratory  c/o's or need for noct saba. Also denies any obvious fluctuation of symptoms with weather or environmental changes or  other aggravating or alleviating factors except as outlined above   No unusual exposure hx or h/o childhood pna/ asthma or knowledge of premature birth.  Current Allergies, Complete Past Medical History, Past Surgical History, Family History, and Social History were reviewed in [employer] record.  ROS  The following are not active complaints unless bolded Hoarseness, sore throat, dysphagia, dental problems, itching, sneezing,  nasal congestion or discharge of excess mucus or purulent secretions, ear ache,   fever, chills, sweats, unintended wt loss or wt gain, classically pleuritic or exertional cp,  orthopnea pnd or arm/hand swelling  or leg swelling, presyncope, palpitations, abdominal pain, anorexia, nausea, vomiting, diarrhea  or change in bowel habits or change in bladder habits, change in stools or change in urine, dysuria, hematuria,  rash, arthralgias, visual complaints, headache, numbness, weakness or ataxia or problems with walking or coordination,  change in mood or  memory.        Current Meds  Medication Sig  . acetaminophen (TYLENOL) 325 MG tablet Take 650 mg by mouth every 6 (six) hours as needed for moderate pain.   Marland Kitchen albuterol (VENTOLIN HFA) 108 (90 Base) MCG/ACT inhaler Inhale 2 puffs into the lungs every 6 (six) hours as needed for wheezing.  . Ascorbic Acid (VITAMIN C) 500 MG tablet Take 500 mg by mouth daily.  Marland Kitchen aspirin  81 MG tablet Take 81 mg by mouth daily.  Marland Kitchen Coral Calcium 1000 (390 CA) MG TABS Take 1,000 mg by mouth every other day.  . fluticasone (FLONASE) 50 MCG/ACT nasal spray Place 1 spray into both nostrils daily as needed for allergies.   Marland Kitchen guaiFENesin (MUCINEX) 600 MG 12 hr tablet Take 1 tablet (600 mg total) by mouth 2 (two) times daily.  Marland Kitchen loratadine (CLARITIN) 10 MG tablet Take 10 mg by mouth daily.  . metoprolol succinate (TOPROL-XL) 50 MG 24 hr tablet Take 50 mg by mouth daily. Take with or immediately following a meal.  . Multiple  Vitamin (MULTIVITAMIN) tablet Take 1 tablet by mouth every other day.  . olmesartan-hydrochlorothiazide (BENICAR HCT) 20-12.5 MG tablet Take 1 tablet by mouth daily.  Marland Kitchen oxyCODONE (OXY IR/ROXICODONE) 5 MG immediate release tablet Take 1 tablet (5 mg total) by mouth every 4 (four) hours as needed for moderate pain.  . pravastatin (PRAVACHOL) 40 MG tablet Take 40 mg by mouth at bedtime.                    Past Medical History:  Diagnosis Date  . Hyperlipidemia   . Hypertension        Objective:    Wt Readings from Last 3 Encounters:  07/28/20 164 lb (74.4 kg)  07/19/20 160 lb (72.6 kg)  05/12/20 156 lb 14.4 oz (71.2 kg)  01/26/2010       148  Vital signs reviewed  07/28/2020  - Note at rest 02 sats  100% on RA on 2lpm and 94% RA   General appearance:    amb pleasant bf nad    HEENT : pt wearing mask not removed for exam due to covid -19 concerns.    NECK :  without JVD/Nodes/TM/ nl carotid upstrokes bilaterally   LUNGS: no acc muscle use,  Mod barrel  contour chest wall with bilateral  Distant bs s audible wheeze and  without cough on insp or exp maneuvers and mod  Hyperresonant  to  percussion bilaterally     CV:  RRR  no s3 or murmur or increase in P2, and no edema   ABD:  soft and nontender with pos mid insp Hoover's  in the supine position. No bruits or organomegaly appreciated, bowel sounds nl  MS:     ext warm without deformities, calf tenderness, cyanosis or clubbing No obvious joint restrictions   SKIN: warm and dry without lesions    NEURO:  alert, approp, nl sensorium with  no motor or cerebellar deficits apparent.            I personally reviewed images and agree with radiology impression as follows:  CXR:   Pa and lat  07/19/20 1. No acute cardiopulmonary findings. 2.  Atherosclerotic calcification of the aorta.      Assessment

## 2020-09-06 ENCOUNTER — Encounter: Payer: Self-pay | Admitting: Internal Medicine

## 2020-09-06 ENCOUNTER — Other Ambulatory Visit: Payer: Self-pay

## 2020-09-06 ENCOUNTER — Ambulatory Visit: Payer: Medicare Other | Admitting: Internal Medicine

## 2020-09-06 DIAGNOSIS — I10 Essential (primary) hypertension: Secondary | ICD-10-CM | POA: Diagnosis not present

## 2020-09-06 DIAGNOSIS — J449 Chronic obstructive pulmonary disease, unspecified: Secondary | ICD-10-CM

## 2020-09-06 DIAGNOSIS — J9611 Chronic respiratory failure with hypoxia: Secondary | ICD-10-CM

## 2020-09-06 MED ORDER — TRELEGY ELLIPTA 100-62.5-25 MCG/INH IN AEPB: 1.0000 | INHALATION_SPRAY | Freq: Every day | RESPIRATORY_TRACT | 3 refills | Status: DC

## 2020-09-06 NOTE — Assessment & Plan Note (Signed)
Quit smoking 03/15/2020 - PFT's  03/15/20  FEV1 0.90 (44 % ) ratio 0.54  p 3 % improvement from saba p 0 prior to study with DLCO  12.22 (84%) corrects to 3.59 (85%)  for alv volume and FV curve classic concave contour on exp portion   - 07/28/2020  After extensive coaching inhaler device,  effectiveness =    0 with hfa - 07/28/2020  After extensive coaching inhaler device,  effectiveness =    75% with elipta with short ti > try trelegy > improved 09/06/2020 > continue and f/u yearly in pulmonary clinic in [place]   Group D in terms of symptom/risk and laba/lama/ICS  therefore appropriate rx at this point >>>  Continue trelegy 100 q am

## 2020-09-06 NOTE — Assessment & Plan Note (Signed)
D/c'd  acei 01/27/2020 due to report of noct "wheeze" with pseudowheeze on exam   Not optimally controlled on present regimen. I reviewed this with the patient and emphasized importance of follow-up with primary care and avoid all salt/salty food in meantime    Each maintenance medication was reviewed in detail including emphasizing most importantly the difference between maintenance and prns and under what circumstances the prns are to be triggered using an action plan format where appropriate.  Total time for H and P, chart review, counseling, reviewing elipta device(s) , directly observing portions of ambulatory 02 saturation study/ and generating customized AVS unique to this office visit / same day charting = 33 min

## 2020-09-06 NOTE — Progress Notes (Signed)
Yolanda Bauer, female    DOB: 05/17/1945    MRN: [id number]   Brief patient profile:  74 yobf quit smoking 03/2020 with hbp on ACEi with noct "wheeze" on prn albuterol helped some referred to pulmonary clinic in [place]  01/27/2020 by Dr Yolanda Levy PA re SPN  LLL sup segment.    History of Present Illness  01/27/2020  Pulmonary/ 1st office eval/Yolanda Bauer  Chief Complaint  Patient presents with  . Consult    productive cough with white phlegm in the mornings  Dyspnea:  No steps at her home but Not limited by breathing from desired activities   Cough: some am's / no hemoptysis/ some "wheeze at hs  Sleep: bed is flat/ one pillow  SABA use: none 02 none   rec Stop lisinopril and instead take olmesartan 20- 12.5 one daily  The key is to stop smoking completely before smoking completely stops you! My office will call you to schedule you a PET scan next available and I will call you with the results   04/11/20 L superior segmentectomy [facility]):  T2, N0, stage Ib squamous cell carcinoma.    07/28/2020  f/u ov/[place] office/Yolanda Bauer re: s/p L sup  Segmentectomy  Chief Complaint  Patient presents with  . Follow-up    Patient states has "shortness of breath sometimes but not as much as she used to"   Essential hypertension D/c acei 01/27/2020 due to report of noct "wheeze"   Dyspnea: able to shop [employer] sometimes s 02 /can't name one activity  Cough: nasal congestion  Sleeping: R side down/ bed is flat/ 2 pillows  SABA use: saba rarely  02: 2 lpm  Covid status: vax x 3 Lung cancer screening: n/a  rec Plan A = Automatic = Always=    Trelegy 100 each am  Work on inhaler technique:   Plan B = Backup (to supplement plan A, not to replace it) Only use your albuterol inhaler as a rescue medication  Continue 02 is 2lpm at bedtime and then during the day keep over  90%  Make sure you check your oxygen saturation  at your highest level of activity  to be sure it stays over  90%    09/06/2020  f/u ov/[place] office/Yolanda Bauer re: GOLD III copd / L sup segmentectomy Chief Complaint  Patient presents with  . Follow-up    Breathing has improved since her last visit. She has not had to use her albuterol inhaler.   Dyspnea:  Says not limited by breathing/ shops at [place] s 02  Cough: none  Sleeping: flat bed/ two pillows SABA use: not needing  02: 2lpm hs and rarely daytime  Covid status: vax 3  Lung cancer screening: n/a    No obvious day to day or daytime variability or assoc excess/ purulent sputum or mucus plugs or hemoptysis or cp or chest tightness, subjective wheeze or overt sinus or hb symptoms.   Sleeping as above without nocturnal  or early am exacerbation  of respiratory  c/o's or need for noct saba. Also denies any obvious fluctuation of symptoms with weather or environmental changes or other aggravating or alleviating factors except as outlined above   No unusual exposure hx or h/o childhood pna/ asthma or knowledge of premature birth.  Current Allergies, Complete Past Medical History, Past Surgical History, Family History, and Social History were reviewed in [employer] record.  ROS  The following are not active complaints unless bolded Hoarseness, sore throat, dysphagia,  dental problems, itching, sneezing,  nasal congestion or discharge of excess mucus or purulent secretions, ear ache,   fever, chills, sweats, unintended wt loss or wt gain, classically pleuritic or exertional cp,  orthopnea pnd or arm/hand swelling  or leg swelling, presyncope, palpitations, abdominal pain, anorexia, nausea, vomiting, diarrhea  or change in bowel habits or change in bladder habits, change in stools or change in urine, dysuria, hematuria,  rash, arthralgias, visual complaints, headache, numbness, weakness or ataxia or problems with walking or coordination,  change in mood or  memory.        Current Meds  Medication Sig  . acetaminophen  (TYLENOL) 325 MG tablet Take 650 mg by mouth every 6 (six) hours as needed for moderate pain.   Marland Kitchen albuterol (VENTOLIN HFA) 108 (90 Base) MCG/ACT inhaler Inhale 2 puffs into the lungs every 6 (six) hours as needed for wheezing.  . Ascorbic Acid (VITAMIN C) 500 MG tablet Take 500 mg by mouth daily.  Marland Kitchen aspirin 81 MG tablet Take 81 mg by mouth daily.  Marland Kitchen Coral Calcium 1000 (390 CA) MG TABS Take 1,000 mg by mouth every other day.  . fluticasone (FLONASE) 50 MCG/ACT nasal spray Place 1 spray into both nostrils daily as needed for allergies.   . Fluticasone-Umeclidin-Vilant (TRELEGY ELLIPTA) 100-62.5-25 MCG/INH AEPB Inhale 1 puff into the lungs daily.  Marland Kitchen guaiFENesin (MUCINEX) 600 MG 12 hr tablet Take 1 tablet (600 mg total) by mouth 2 (two) times daily.  Marland Kitchen loratadine (CLARITIN) 10 MG tablet Take 10 mg by mouth daily.  . metoprolol succinate (TOPROL-XL) 50 MG 24 hr tablet Take 50 mg by mouth daily. Take with or immediately following a meal.  . Multiple Vitamin (MULTIVITAMIN) tablet Take 1 tablet by mouth every other day.  . olmesartan-hydrochlorothiazide (BENICAR HCT) 20-12.5 MG tablet Take 1 tablet by mouth daily.  . pravastatin (PRAVACHOL) 40 MG tablet Take 40 mg by mouth at bedtime.                      Past Medical History:  Diagnosis Date  . Hyperlipidemia   . Hypertension        Objective:    09/06/2020           168  07/28/20 164 lb (74.4 kg)  07/19/20 160 lb (72.6 kg)  05/12/20 156 lb 14.4 oz (71.2 kg)  01/26/2010       148   Vital signs reviewed  09/06/2020  - Note at rest 02 sats  97% on RA   General appearance:    Pleasant amb bf nad   HEENT : pt wearing mask not removed for exam due to covid -19 concerns.    NECK :  without JVD/Nodes/TM/ nl carotid upstrokes bilaterally   LUNGS: no acc muscle use,  Mod barrel  contour chest wall with bilateral  Distant bs s audible wheeze and  without cough on insp or exp maneuvers and mod  Hyperresonant  to  percussion bilaterally      CV:  RRR  no s3 or murmur or increase in P2, and no edema   ABD:  soft and nontender with pos mid insp Hoover's  in the supine position. No bruits or organomegaly appreciated, bowel sounds nl  MS:     ext warm without deformities, calf tenderness, cyanosis or clubbing No obvious joint restrictions   SKIN: warm and dry without lesions    NEURO:  alert, approp, nl sensorium with  no motor or  cerebellar deficits apparent.                    Assessment

## 2020-09-06 NOTE — Patient Instructions (Addendum)
We will check your 02 saturations with activity today  Continue trelegy 397 one click each am then rinse and gargle   Make sure you check your oxygen saturation  at your highest level of activity  to be sure it stays over 90% and adjust  02 flow upward to maintain this level if needed but remember to turn it back to previous settings when you stop (to conserve your supply).   Make sure to monitor your blood pressure and call your PCP if over 140 on top or 90 on bottom   Please schedule a follow up visit in 6  months but call sooner if needed

## 2020-09-06 NOTE — Assessment & Plan Note (Signed)
As of surgery 04/11/20 02 2lpm  07/28/2020   Walked RA  approx   400 ft  @ mod fast pace  stopped due to  Sob/ sats 90%    -  09/06/2020   Walked RA  approx   400 ft  @ mod pace  stopped due to min sob with sats 90%   As of 09/06/2020 rec 2lpm hs and prn daytime with sat goal > 90%

## 2020-09-21 ENCOUNTER — Ambulatory Visit (INDEPENDENT_AMBULATORY_CARE_PROVIDER_SITE_OTHER): Payer: Self-pay | Admitting: *Deleted

## 2020-09-21 ENCOUNTER — Other Ambulatory Visit: Payer: Self-pay

## 2020-09-21 VITALS — Ht 62.0 in | Wt 164.8 lb

## 2020-09-21 DIAGNOSIS — Z1211 Encounter for screening for malignant neoplasm of colon: Secondary | ICD-10-CM

## 2020-09-21 MED ORDER — PEG 3350-KCL-NA BICARB-NACL 420 G PO SOLR: 4000.0000 mL | Freq: Once | ORAL | 0 refills | Status: AC

## 2020-09-21 NOTE — Patient Instructions (Addendum)
Yolanda Bauer   02/20/1946 MRN: [id number]     Procedure Date: 11/21/2020 Time to register: 9:30 AM  Place to register: [facility] Scheduled provider: Dr. Abbey Chatters  PREPARATION FOR COLONOSCOPY WITH TRI-LYTE SPLIT PREP  Please notify us immediately if you are diabetic, take iron supplements, or if you are on Coumadin or any other blood thinners.   Please hold the following medications: n/a  You will need to purchase 1 fleet enema and 1 box of Bisacodyl 39m tablets.   2 DAYS BEFORE PROCEDURE:  DATE: 11/19/2020  DAY: Saturday Begin clear liquid diet AFTER your lunch meal. NO SOLID FOODS after this point.  1 DAY BEFORE PROCEDURE:  DATE: 11/20/2020   DAY: Sunday Continue clear liquids the entire day - NO SOLID FOOD.   Diabetic medications adjustments for today: n/a  At 2:00 pm:  Take 2 Bisacodyl tablets.   At 4:00pm:  Start drinking your solution. Make sure you mix well per instructions on the bottle. Try to drink 1 (one) 8 ounce glass every 10-15 minutes until you have consumed HALF the jug. You should complete by 6:00pm.You must keep the left over solution refrigerated until completed next day.  Continue clear liquids. You must drink plenty of clear liquids to prevent dehyration and kidney failure.     DAY OF PROCEDURE:   DATE: 718/2022   DAY: Monday If you take medications for your heart, blood pressure or breathing, you may take these medications.  Diabetic medications adjustments for today: n/a  Five hours before your procedure time @ 6:00 am:  Finish remaining amout of bowel prep, drinking 1 (one) 8 ounce glass every 10-15 minutes until complete. You have two hours to consume remaining prep.   Three hours before your procedure time @ 8:00 am:  Nothing by mouth.   At least one hour before going to the hospital:  Give yourself one Fleet enema. You may take your morning medications with sip of water unless we have instructed otherwise.      Please see below for  Dietary Information.  CLEAR LIQUIDS INCLUDE:  Water Jello (NOT red in color)   Ice Popsicles (NOT red in color)   Tea (sugar ok, no milk/cream) Powdered fruit flavored drinks  Coffee (sugar ok, no milk/cream) Gatorade/ Lemonade/ Kool-Aid  (NOT red in color)   Juice: apple, white grape, white cranberry Soft drinks  Clear bullion, consomme, broth (fat free beef/chicken/vegetable)  Carbonated beverages (any kind)  Strained chicken noodle soup Hard Candy   Remember: Clear liquids are liquids that will allow you to see your fingers on the other side of a clear glass. Be sure liquids are NOT red in color, and not cloudy, but CLEAR.  DO NOT EAT OR DRINK ANY OF THE FOLLOWING:  Dairy products of any kind   Cranberry juice Tomato juice / V8 juice   Grapefruit juice Orange juice     Red grape juice  Do not eat any solid foods, including such foods as: cereal, oatmeal, yogurt, fruits, vegetables, creamed soups, eggs, bread, crackers, pureed foods in a blender, etc.   HELPFUL HINTS FOR DRINKING PREP SOLUTION:   Make sure prep is extremely cold. Mix and refrigerate the the morning of the prep. You may also put in the freezer.   You may try mixing some Crystal Light or Country Time Lemonade if you prefer. Mix in small amounts; add more if necessary.  Try drinking through a straw  Rinse mouth with water or a mouthwash  between glasses, to remove after-taste.  Try sipping on a cold beverage /ice/ popsicles between glasses of prep.  Place a piece of sugar-free hard candy in mouth between glasses.  If you become nauseated, try consuming smaller amounts, or stretch out the time between glasses. Stop for 30-60 minutes, then slowly start back drinking.        OTHER INSTRUCTIONS  You will need a responsible adult at least 73 years of age to accompany you and drive you home. This person must remain in the waiting room during your procedure. The hospital will cancel your procedure if you do not  have a responsible adult with you.   1. Wear loose fitting clothing that is easily removed. 2. Leave jewelry and other valuables at home.  3. Remove all body piercing jewelry and leave at home. 4. Total time from sign-in until discharge is approximately 2-3 hours. 5. You should go home directly after your procedure and rest. You can resume normal activities the day after your procedure. 6. The day of your procedure you should not:  Drive  Make legal decisions  Operate machinery  Drink alcohol  Return to work   You may call the office (Dept: [phone]) before 5:00pm, or page the doctor on call [phone]) after 5:00pm, for further instructions, if necessary.   Insurance Information YOU WILL NEED TO CHECK WITH YOUR INSURANCE COMPANY FOR THE BENEFITS OF COVERAGE YOU HAVE FOR THIS PROCEDURE.  UNFORTUNATELY, NOT ALL INSURANCE COMPANIES HAVE BENEFITS TO COVER ALL OR PART OF THESE TYPES OF PROCEDURES.  IT IS YOUR RESPONSIBILITY TO CHECK YOUR BENEFITS, HOWEVER, WE WILL BE GLAD TO ASSIST YOU WITH ANY CODES YOUR INSURANCE COMPANY MAY NEED.    PLEASE NOTE THAT MOST INSURANCE COMPANIES WILL NOT COVER A SCREENING COLONOSCOPY FOR PEOPLE UNDER THE AGE OF 50  IF YOU HAVE BCBS INSURANCE, YOU MAY HAVE BENEFITS FOR A SCREENING COLONOSCOPY BUT IF POLYPS ARE FOUND THE DIAGNOSIS WILL CHANGE AND THEN YOU MAY HAVE A DEDUCTIBLE THAT WILL NEED TO BE MET. SO PLEASE MAKE SURE YOU CHECK YOUR BENEFITS FOR A SCREENING COLONOSCOPY AS WELL AS A DIAGNOSTIC COLONOSCOPY.

## 2020-09-21 NOTE — Progress Notes (Addendum)
Gastroenterology Pre-Procedure Review  Request Date: 09/21/2020 Requesting Physician: 10 year recall, Last TCS 08/28/2010 by Dr. Oneida Alar, no polyps, family hx of colon cancer (uncle)  PATIENT REVIEW QUESTIONS: The patient responded to the following health history questions as indicated:    1. Diabetes Melitis: no 2. Joint replacements in the past 12 months: no 3. Major health problems in the past 3 months: no 4. Has an artificial valve or MVP: no 5. Has a defibrillator: no 6. Has been advised in past to take antibiotics in advance of a procedure like teeth cleaning: no 7. Family history of colon cancer: yes, uncle: age 60's  8. Alcohol Use: no 9. Illicit drug Use: no 10. History of sleep apnea: no  11. History of coronary artery or other vascular stents placed within the last 12 months: no 12. History of any prior anesthesia complications: no 13. Body mass index is 30.14 kg/m.    MEDICATIONS & ALLERGIES:    Patient reports the following regarding taking any blood thinners:   Plavix? no Aspirin? yes, 81 mg Coumadin? no Brilinta? no Xarelto? no Eliquis? no Pradaxa? no Savaysa? no Effient? no  Patient confirms/reports the following medications:  Current Outpatient Medications  Medication Sig Dispense Refill  . acetaminophen (TYLENOL) 325 MG tablet Take 650 mg by mouth as needed for moderate pain.    Marland Kitchen albuterol (VENTOLIN HFA) 108 (90 Base) MCG/ACT inhaler Inhale 2 puffs into the lungs every 6 (six) hours as needed for wheezing. 8 g 0  . Ascorbic Acid (VITAMIN C) 500 MG tablet Take 500 mg by mouth daily.    Marland Kitchen aspirin 81 MG tablet Take 81 mg by mouth daily.    Marland Kitchen Coral Calcium 1000 (390 CA) MG TABS Take 1,000 mg by mouth every other day.    . fluticasone (FLONASE) 50 MCG/ACT nasal spray Place 1 spray into both nostrils daily as needed for allergies.     . Fluticasone-Umeclidin-Vilant (TRELEGY ELLIPTA) 100-62.5-25 MCG/INH AEPB Inhale 1 puff into the lungs daily. 3 each 3  .  guaiFENesin (MUCINEX) 600 MG 12 hr tablet Take 1 tablet (600 mg total) by mouth 2 (two) times daily. (Patient taking differently: Take 600 mg by mouth as needed.)    . loratadine (CLARITIN) 10 MG tablet Take 10 mg by mouth daily.    . metoprolol succinate (TOPROL-XL) 50 MG 24 hr tablet Take 50 mg by mouth daily. Take with or immediately following a meal.    . Multiple Vitamin (MULTIVITAMIN) tablet Take 1 tablet by mouth every other day.    . olmesartan-hydrochlorothiazide (BENICAR HCT) 20-12.5 MG tablet Take 1 tablet by mouth daily. 90 tablet 1  . pravastatin (PRAVACHOL) 40 MG tablet Take 40 mg by mouth at bedtime.      No current facility-administered medications for this visit.    Patient confirms/reports the following allergies:  Allergies  Allergen Reactions  . Penicillins Hives    No orders of the defined types were placed in this encounter.   AUTHORIZATION INFORMATION Primary Insurance: [facility] ,  ID #: [id number],  Group #: [id number] Pre-Cert / Auth required: Yes, approved online 10/18/4313-40/12/6759 Pre-Cert / Josem Kaufmann #: [id number]  SCHEDULE INFORMATION: Procedure has been scheduled as follows:  Date: 10/24/2020, Time: 11:00 Location: [facility] with Dr. Abbey Chatters  This Gastroenterology Pre-Precedure Review Form is being routed to the following provider(s): Neil Crouch, PA

## 2020-09-21 NOTE — Progress Notes (Signed)
Pt said that she had lung cancer surgery on 04/11/2020 by Dr. Modesto Charon.  She sees pulmonologist ( Dr. Christinia Gully) on regular basis.  Pt says she is cancer free right now.

## 2020-10-04 NOTE — Progress Notes (Signed)
Patient needs ov due to lung disease. Likely ASA III.

## 2020-10-06 NOTE — Progress Notes (Signed)
Yolanda Crouch, PA:  With the new changes, will pt still need ov?

## 2020-10-07 NOTE — Progress Notes (Signed)
Will not need ov. Ok to proceed with propofol. ASA III.

## 2020-10-10 ENCOUNTER — Encounter: Payer: Self-pay | Admitting: *Deleted

## 2020-10-10 ENCOUNTER — Other Ambulatory Visit: Payer: Self-pay | Admitting: *Deleted

## 2020-10-10 DIAGNOSIS — Z139 Encounter for screening, unspecified: Secondary | ICD-10-CM

## 2020-10-10 NOTE — Progress Notes (Signed)
Spoke with pt.  Rescheduled her procedure to an ASA III date (11/21/2020 at 11:00).  Pt made aware that I will mail out new instructions.  She is aware that she will need pre-op appointment and labs drawn prior to her procedure.  She voiced understanding.

## 2020-10-12 ENCOUNTER — Other Ambulatory Visit: Payer: Self-pay | Admitting: *Deleted

## 2020-10-13 ENCOUNTER — Other Ambulatory Visit: Payer: Self-pay | Admitting: *Deleted

## 2020-10-21 ENCOUNTER — Other Ambulatory Visit (HOSPITAL_COMMUNITY): Payer: Medicare Other

## 2020-11-09 ENCOUNTER — Inpatient Hospital Stay: Payer: Medicare Other | Attending: Internal Medicine

## 2020-11-09 ENCOUNTER — Other Ambulatory Visit: Payer: Self-pay

## 2020-11-09 ENCOUNTER — Ambulatory Visit (HOSPITAL_COMMUNITY)
Admission: RE | Admit: 2020-11-09 | Discharge: 2020-11-09 | Disposition: A | Discharge status: Home / Self Care | Payer: Medicare Other | Source: Ambulatory Visit | Attending: Internal Medicine | Admitting: Internal Medicine

## 2020-11-09 ENCOUNTER — Encounter (HOSPITAL_COMMUNITY): Payer: Self-pay

## 2020-11-09 DIAGNOSIS — C3490 Malignant neoplasm of unspecified part of unspecified bronchus or lung: Secondary | ICD-10-CM

## 2020-11-09 DIAGNOSIS — Z85118 Personal history of other malignant neoplasm of bronchus and lung: Secondary | ICD-10-CM | POA: Insufficient documentation

## 2020-11-09 LAB — CBC WITH DIFFERENTIAL (CANCER CENTER ONLY)
Abs Immature Granulocytes: 0.01 10*3/uL (ref 0.00–0.07)
Basophils Absolute: 0 10*3/uL (ref 0.0–0.1)
Basophils Relative: 0 %
Eosinophils Absolute: 0.1 10*3/uL (ref 0.0–0.5)
Eosinophils Relative: 1 %
HCT: 39 % (ref 36.0–46.0)
Hemoglobin: 12.9 g/dL (ref 12.0–15.0)
Immature Granulocytes: 0 %
Lymphocytes Relative: 29 %
Lymphs Abs: 1.5 10*3/uL (ref 0.7–4.0)
MCH: 28.6 pg (ref 26.0–34.0)
MCHC: 33.1 g/dL (ref 30.0–36.0)
MCV: 86.5 fL (ref 80.0–100.0)
Monocytes Absolute: 0.5 10*3/uL (ref 0.1–1.0)
Monocytes Relative: 10 %
Neutro Abs: 3.1 10*3/uL (ref 1.7–7.7)
Neutrophils Relative %: 60 %
Platelet Count: 289 10*3/uL (ref 150–400)
RBC: 4.51 MIL/uL (ref 3.87–5.11)
RDW: 15.1 % (ref 11.5–15.5)
WBC Count: 5.2 10*3/uL (ref 4.0–10.5)
nRBC: 0 % (ref 0.0–0.2)

## 2020-11-09 LAB — CMP (CANCER CENTER ONLY)
ALT: 13 U/L (ref 0–44)
AST: 18 U/L (ref 15–41)
Albumin: 3.7 g/dL (ref 3.5–5.0)
Alkaline Phosphatase: 66 U/L (ref 38–126)
Anion gap: 7 (ref 5–15)
BUN: 15 mg/dL (ref 8–23)
CO2: 30 mmol/L (ref 22–32)
Calcium: 10.1 mg/dL (ref 8.9–10.3)
Chloride: 104 mmol/L (ref 98–111)
Creatinine: 0.72 mg/dL (ref 0.44–1.00)
GFR, Estimated: >60 mL/min (ref >60)
Glucose, Bld: 98 mg/dL (ref 70–99)
Potassium: 4.2 mmol/L (ref 3.5–5.1)
Sodium: 141 mmol/L (ref 135–145)
Total Bilirubin: 0.4 mg/dL (ref 0.3–1.2)
Total Protein: 7.6 g/dL (ref 6.5–8.1)

## 2020-11-09 MED ORDER — IOHEXOL 300 MG/ML  SOLN
75.0000 mL | Freq: Once | INTRAMUSCULAR | Status: AC | PRN
  Administered 2020-11-09: 11:00:00 75 mL via INTRAVENOUS

## 2020-11-09 MED ORDER — SODIUM CHLORIDE (PF) 0.9 % IJ SOLN
INTRAMUSCULAR | Status: AC
  Filled 2020-11-09: qty 50

## 2020-11-10 ENCOUNTER — Inpatient Hospital Stay: Payer: Medicare Other | Admitting: Internal Medicine

## 2020-11-10 VITALS — BP 170/64 | HR 73 | Temp 98.7°F | Resp 19 | Ht 62.0 in | Wt 171.6 lb

## 2020-11-10 DIAGNOSIS — C3490 Malignant neoplasm of unspecified part of unspecified bronchus or lung: Secondary | ICD-10-CM | POA: Diagnosis not present

## 2020-11-10 DIAGNOSIS — C3432 Malignant neoplasm of lower lobe, left bronchus or lung: Secondary | ICD-10-CM | POA: Diagnosis not present

## 2020-11-10 DIAGNOSIS — Z85118 Personal history of other malignant neoplasm of bronchus and lung: Secondary | ICD-10-CM | POA: Diagnosis not present

## 2020-11-10 DIAGNOSIS — I10 Essential (primary) hypertension: Secondary | ICD-10-CM | POA: Diagnosis not present

## 2020-11-10 NOTE — Progress Notes (Signed)
[place] [phone]   [phone]  OFFICE PROGRESS NOTE  Denny [address], [place] [address]  DIAGNOSIS: Stage IB (T2 a, N0, M0) non-small cell lung cancer, squamous cell carcinoma.  PRIOR THERAPY: status post left lower lobe superior segmentectomy with lymph node dissection under the care of Dr. Roxan Hockey on March 12, 2020 and the final size of the tumor was 3.9 cm with visceral pleural invasion  CURRENT THERAPY: Observation.  INTERVAL HISTORY: Yolanda Bauer 75 y.o. female returns to the clinic today for 85-month follow-up visit.  The patient is feeling fine today with no concerning complaints except for occasional itching at the surgical scar.  The patient denied having any current chest pain, shortness of breath, cough or hemoptysis.  She denied having any fever or chills.  She has no nausea, vomiting, diarrhea or constipation.  She has no headache or visual changes.  She had repeat CT scan of the chest performed recently and she is here for evaluation and discussion of her scan results.  MEDICAL HISTORY: Past Medical History:  Diagnosis Date   Anxiety    Arthritis    Dyspnea    Hyperlipidemia    Hypertension    Pneumonia    scl ca 03/2020    ALLERGIES:  is allergic to penicillins.  MEDICATIONS:  Current Outpatient Medications  Medication Sig Dispense Refill   acetaminophen (TYLENOL) 325 MG tablet Take 650 mg by mouth every 6 (six) hours as needed for moderate pain.     albuterol (VENTOLIN HFA) 108 (90 Base) MCG/ACT inhaler Inhale 2 puffs into the lungs every 6 (six) hours as needed for wheezing. 8 g 0   Ascorbic Acid (VITAMIN C) 500 MG tablet Take 500 mg by mouth daily.     aspirin 81 MG tablet Take 81 mg by mouth daily.     bismuth subsalicylate (PEPTO BISMOL) 262 MG/15ML suspension Take 30 mLs by mouth every 6 (six) hours as needed for indigestion.     Coral Calcium 1000 (390 CA) MG TABS Take 1,000 mg by mouth  every other day.     fluticasone (FLONASE) 50 MCG/ACT nasal spray Place 1 spray into both nostrils daily as needed for allergies.      Fluticasone-Umeclidin-Vilant (TRELEGY ELLIPTA) 100-62.5-25 MCG/INH AEPB Inhale 1 puff into the lungs daily. 3 each 3   Glycerin-Hypromellose-PEG 400 (DRY EYE RELIEF DROPS OP) Place 1 drop into both eyes daily as needed (dry eyes).     guaiFENesin (MUCINEX) 600 MG 12 hr tablet Take 1 tablet (600 mg total) by mouth 2 (two) times daily. (Patient taking differently: Take 600 mg by mouth daily as needed (congestion).)     loratadine (CLARITIN) 10 MG tablet Take 10 mg by mouth daily.     Menthol, Topical Analgesic, (ICY HOT EX) Apply 1 application topically daily as needed (muscle pain).     metoprolol succinate (TOPROL-XL) 50 MG 24 hr tablet Take 50 mg by mouth daily. Take with or immediately following a meal.     Multiple Vitamin (MULTIVITAMIN) tablet Take 1 tablet by mouth every other day.     olmesartan-hydrochlorothiazide (BENICAR HCT) 20-12.5 MG tablet Take 1 tablet by mouth daily. 90 tablet 1   pravastatin (PRAVACHOL) 40 MG tablet Take 40 mg by mouth at bedtime.      No current facility-administered medications for this visit.    SURGICAL HISTORY:  Past Surgical History:  Procedure Laterality Date   BACK  SURGERY     HERNIA REPAIR     INTERCOSTAL NERVE BLOCK Left 04/11/2020   Procedure: INTERCOSTAL NERVE BLOCK;  Surgeon: [name], MD;  Location: [facility];  Service: Thoracic;  Laterality: Left;   NODE DISSECTION Left 04/11/2020   Procedure: NODE DISSECTION;  Surgeon: [name], MD;  Location: [facility];  Service: Thoracic;  Laterality: Left;   TUBAL LIGATION     XI ROBOTIC ASSISTED THORACOSCOPY- SEGMENTECTOMY Left 04/11/2020   Procedure: XI ROBOTIC ASSISTED THORACOSCOPY-LEFT LOWER LOBE SUPERIOR SEGMENTECTOMY;  Surgeon: [name], MD;  Location: [facility];  Service: Thoracic;  Laterality: Left;    REVIEW OF SYSTEMS:  A comprehensive  review of systems was negative.   PHYSICAL EXAMINATION: General appearance: alert, cooperative, and no distress Head: Normocephalic, without obvious abnormality, atraumatic Neck: no adenopathy, no JVD, supple, symmetrical, trachea midline, and thyroid not enlarged, symmetric, no tenderness/mass/nodules Lymph nodes: Cervical, supraclavicular, and axillary nodes normal. Resp: clear to auscultation bilaterally Back: symmetric, no curvature. ROM normal. No CVA tenderness. Cardio: regular rate and rhythm, S1, S2 normal, no murmur, click, rub or gallop GI: soft, non-tender; bowel sounds normal; no masses,  no organomegaly Extremities: extremities normal, atraumatic, no cyanosis or edema  ECOG PERFORMANCE STATUS: 1 - Symptomatic but completely ambulatory  Blood pressure (!) 170/64, pulse 73, temperature 98.7 F (37.1 C), temperature source Tympanic, resp. rate 19, height 5\' 2"  (1.575 m), weight 171 lb 9.6 oz (77.8 kg), SpO2 95 %.  LABORATORY DATA: Lab Results  Component Value Date   WBC 5.2 11/09/2020   HGB 12.9 11/09/2020   HCT 39.0 11/09/2020   MCV 86.5 11/09/2020   PLT 289 11/09/2020      Chemistry      Component Value Date/Time   NA 141 11/09/2020 0909   K 4.2 11/09/2020 0909   CL 104 11/09/2020 0909   CO2 30 11/09/2020 0909   BUN 15 11/09/2020 0909   CREATININE 0.72 11/09/2020 0909      Component Value Date/Time   CALCIUM 10.1 11/09/2020 0909   ALKPHOS 66 11/09/2020 0909   AST 18 11/09/2020 0909   ALT 13 11/09/2020 0909   BILITOT 0.4 11/09/2020 0909       RADIOGRAPHIC STUDIES: CT Chest W Contrast  Result Date: 11/09/2020 CLINICAL DATA:  Non-small cell lung cancer, diagnosed 2021, status post left lower lobectomy. Shortness of breath. EXAM: CT CHEST WITH CONTRAST TECHNIQUE: Multidetector CT imaging of the chest was performed during intravenous contrast administration. CONTRAST:  79mL OMNIPAQUE IOHEXOL 300 MG/ML  SOLN COMPARISON:  PET-CT dated 02/15/2020. FINDINGS:  Cardiovascular: Heart is normal in size.  No pericardial effusion. No evidence of thoracic aortic aneurysm. Atherosclerotic calcifications of the aortic arch. Mediastinum/Nodes: No suspicious mediastinal lymphadenopathy. 13 mm left thyroid nodule (series 2/image 22). Not clinically significant; no follow-up imaging recommended (ref: J Am Coll Radiol. 2015 Feb;12(2): 143-50). Lungs/Pleura: Biapical pleural-parenchymal scarring. Status post left lower lobectomy. Moderate centrilobular and paraseptal emphysematous changes, upper lung predominant. No suspicious pulmonary nodules. No focal consolidation. No pleural effusion or pneumothorax. Upper Abdomen: Stable low-density thickening of the right adrenal gland (series 2/image 138), measuring at least 2.0 cm, compatible with a benign adrenal adenoma. Vascular calcifications. Musculoskeletal: Degenerative changes of the visualized thoracolumbar spine. IMPRESSION: Status post left lower lobectomy. No evidence of recurrent or metastatic disease. Benign right adrenal adenoma. Aortic Atherosclerosis (ICD10-I70.0) and Emphysema (ICD10-J43.9). Electronically Signed   By: Julian Hy M.D.   On: 11/09/2020 21:34    ASSESSMENT AND PLAN: This  is a very pleasant 75 years old African-American female diagnosed with a stage Ib (T2 a, N0, M0) non-small cell lung cancer, squamous cell carcinoma status post left lower lobe superior segmentectomy with lymph node dissection under the care of Dr. Roxan Hockey on March 12, 2020 with tumor size of 3.9 cm and visceropleural involvement. The patient is currently on observation and she is feeling fine today with no concerning complaints. She had repeat CT scan of the chest performed recently.  I personally and independently reviewed the scans and discussed the result with the patient today. Her scan showed no concerning findings for disease recurrence or metastasis. I recommended for her to continue on observation with repeat CT scan  of the chest in 6 months. Regarding the hypertension the patient mentions that she has whitecoat syndrome and her blood pressure is usually normal at home. I recommended for her to monitor it closely and discuss with her primary care physician for adjustment of her medication if needed. The patient was advised to call immediately if she has any other concerning symptoms in the interval. The patient voices understanding of current disease status and treatment options and is in agreement with the current care plan.  All questions were answered. The patient knows to call the clinic with any problems, questions or concerns. We can certainly see the patient much sooner if necessary.   Disclaimer: This note was dictated with voice recognition software. Similar sounding words can inadvertently be transcribed and may not be corrected upon review.

## 2020-11-15 NOTE — Patient Instructions (Signed)
Yolanda Bauer  11/15/2020     @PREFPERIOPPHARMACY @   Your procedure is scheduled on  11/21/2020.   Report to [facility] at  0900  A.M.  Call this number if you have problems the morning of surgery:  [phone]   Remember:  Follow the diet and prep instructions given to you by the office.    Take these medicines the morning of surgery with A SIP OF WATER        claritin, metoprolol.  Use your inhalers before you come and bring your rescue inhaler with you.     Do not wear jewelry, make-up or nail polish.  Do not wear lotions, powders, or perfumes, or deodorant.  Do not shave 48 hours prior to surgery.  Men may shave face and neck.  Do not bring valuables to the hospital.  [facility] is not responsible for any belongings or valuables.  Contacts, dentures or bridgework may not be worn into surgery.  Leave your suitcase in the car.  After surgery it may be brought to your room.  For patients admitted to the hospital, discharge time will be determined by your treatment team.  Patients discharged the day of surgery will not be allowed to drive home and must have someone with them for 24 hours.    Special instructions:     DO NOT smoke tobacco or vape for 24 hours before your procedure.  Please read over the following fact sheets that you were given. Anesthesia Post-op Instructions and Care and Recovery After Surgery      Colonoscopy, Adult, Care After This sheet gives you information about how to care for yourself after your procedure. Your health care provider may also give you more specific instructions. If you have problems or questions, contact your health careprovider. What can I expect after the procedure? After the procedure, it is common to have: A small amount of blood in your stool for 24 hours after the procedure. Some gas. Mild cramping or bloating of your abdomen. Follow these instructions at home: Eating and drinking  Drink enough fluid to keep  your urine pale yellow. Follow instructions from your health care provider about eating or drinking restrictions. Resume your normal diet as instructed by your health care provider. Avoid heavy or fried foods that are hard to digest.  Activity Rest as told by your health care provider. Avoid sitting for a long time without moving. Get up to take short walks every 1-2 hours. This is important to improve blood flow and breathing. Ask for help if you feel weak or unsteady. Return to your normal activities as told by your health care provider. Ask your health care provider what activities are safe for you. Managing cramping and bloating  Try walking around when you have cramps or feel bloated. Apply heat to your abdomen as told by your health care provider. Use the heat source that your health care provider recommends, such as a moist heat pack or a heating pad. Place a towel between your skin and the heat source. Leave the heat on for 20-30 minutes. Remove the heat if your skin turns bright red. This is especially important if you are unable to feel pain, heat, or cold. You may have a greater risk of getting burned.  General instructions If you were given a sedative during the procedure, it can affect you for several hours. Do not drive or operate machinery until your health care provider says that  it is safe. For the first 24 hours after the procedure: Do not sign important documents. Do not drink alcohol. Do your regular daily activities at a slower pace than normal. Eat soft foods that are easy to digest. Take over-the-counter and prescription medicines only as told by your health care provider. Keep all follow-up visits as told by your health care provider. This is important. Contact a health care provider if: You have blood in your stool 2-3 days after the procedure. Get help right away if you have: More than a small spotting of blood in your stool. Large blood clots in your  stool. Swelling of your abdomen. Nausea or vomiting. A fever. Increasing pain in your abdomen that is not relieved with medicine. Summary After the procedure, it is common to have a small amount of blood in your stool. You may also have mild cramping and bloating of your abdomen. If you were given a sedative during the procedure, it can affect you for several hours. Do not drive or operate machinery until your health care provider says that it is safe. Get help right away if you have a lot of blood in your stool, nausea or vomiting, a fever, or increased pain in your abdomen. This information is not intended to replace advice given to you by your health care provider. Make sure you discuss any questions you have with your healthcare provider. Document Revised: 04/17/2019 Document Reviewed: 11/17/2018 Elsevier Patient Education  [place] After This sheet gives you information about how to care for yourself after your procedure. Your health care provider may also give you more specific instructions. If you have problems or questions, contact your health careprovider. What can I expect after the procedure? After the procedure, it is common to have: Tiredness. Forgetfulness about what happened after the procedure. Impaired judgment for important decisions. Nausea or vomiting. Some difficulty with balance. Follow these instructions at home: For the time period you were told by your health care provider:     Rest as needed. Do not participate in activities where you could fall or become injured. Do not drive or use machinery. Do not drink alcohol. Do not take sleeping pills or medicines that cause drowsiness. Do not make important decisions or sign legal documents. Do not take care of children on your own. Eating and drinking Follow the diet that is recommended by your health care provider. Drink enough fluid to keep your urine pale yellow. If  you vomit: Drink water, juice, or soup when you can drink without vomiting. Make sure you have little or no nausea before eating solid foods. General instructions Have a responsible adult stay with you for the time you are told. It is important to have someone help care for you until you are awake and alert. Take over-the-counter and prescription medicines only as told by your health care provider. If you have sleep apnea, surgery and certain medicines can increase your risk for breathing problems. Follow instructions from your health care provider about wearing your sleep device: Anytime you are sleeping, including during daytime naps. While taking prescription pain medicines, sleeping medicines, or medicines that make you drowsy. Avoid smoking. Keep all follow-up visits as told by your health care provider. This is important. Contact a health care provider if: You keep feeling nauseous or you keep vomiting. You feel light-headed. You are still sleepy or having trouble with balance after 24 hours. You develop a rash. You have a fever. You have  redness or swelling around the IV site. Get help right away if: You have trouble breathing. You have new-onset confusion at home. Summary For several hours after your procedure, you may feel tired. You may also be forgetful and have poor judgment. Have a responsible adult stay with you for the time you are told. It is important to have someone help care for you until you are awake and alert. Rest as told. Do not drive or operate machinery. Do not drink alcohol or take sleeping pills. Get help right away if you have trouble breathing, or if you suddenly become confused. This information is not intended to replace advice given to you by your health care provider. Make sure you discuss any questions you have with your healthcare provider. Document Revised: 01/07/2020 Document Reviewed: 03/26/2019 Elsevier Patient Education  2022 [employer].

## 2020-11-16 ENCOUNTER — Encounter (HOSPITAL_COMMUNITY)
Admission: RE | Admit: 2020-11-16 | Discharge: 2020-11-16 | Disposition: A | Discharge status: Home / Self Care | Payer: Medicare Other | Source: Ambulatory Visit | Attending: Internal Medicine | Admitting: Internal Medicine

## 2020-11-16 ENCOUNTER — Encounter (HOSPITAL_COMMUNITY): Payer: Self-pay

## 2020-11-16 ENCOUNTER — Other Ambulatory Visit: Payer: Self-pay

## 2020-11-21 ENCOUNTER — Ambulatory Visit (HOSPITAL_COMMUNITY)
Admission: RE | Admit: 2020-11-21 | Discharge: 2020-11-21 | Disposition: A | Discharge status: Home / Self Care | Payer: Medicare Other | Attending: Internal Medicine | Admitting: Internal Medicine

## 2020-11-21 ENCOUNTER — Other Ambulatory Visit: Payer: Self-pay

## 2020-11-21 ENCOUNTER — Ambulatory Visit (HOSPITAL_COMMUNITY): Payer: Medicare Other | Admitting: Anesthesiology

## 2020-11-21 ENCOUNTER — Encounter (HOSPITAL_COMMUNITY): Payer: Self-pay

## 2020-11-21 ENCOUNTER — Encounter (HOSPITAL_COMMUNITY)
Admission: RE | Disposition: A | Discharge status: Home / Self Care | Payer: Self-pay | Source: Home / Self Care | Attending: Internal Medicine

## 2020-11-21 DIAGNOSIS — Z85118 Personal history of other malignant neoplasm of bronchus and lung: Secondary | ICD-10-CM | POA: Insufficient documentation

## 2020-11-21 DIAGNOSIS — Z800 Family history of malignant neoplasm of digestive organs: Secondary | ICD-10-CM | POA: Insufficient documentation

## 2020-11-21 DIAGNOSIS — Z7982 Long term (current) use of aspirin: Secondary | ICD-10-CM | POA: Insufficient documentation

## 2020-11-21 DIAGNOSIS — D122 Benign neoplasm of ascending colon: Secondary | ICD-10-CM | POA: Insufficient documentation

## 2020-11-21 DIAGNOSIS — Z7951 Long term (current) use of inhaled steroids: Secondary | ICD-10-CM | POA: Diagnosis not present

## 2020-11-21 DIAGNOSIS — K635 Polyp of colon: Secondary | ICD-10-CM

## 2020-11-21 DIAGNOSIS — K5730 Diverticulosis of large intestine without perforation or abscess without bleeding: Secondary | ICD-10-CM | POA: Diagnosis not present

## 2020-11-21 DIAGNOSIS — Z87891 Personal history of nicotine dependence: Secondary | ICD-10-CM | POA: Insufficient documentation

## 2020-11-21 DIAGNOSIS — Z79899 Other long term (current) drug therapy: Secondary | ICD-10-CM | POA: Diagnosis not present

## 2020-11-21 DIAGNOSIS — K648 Other hemorrhoids: Secondary | ICD-10-CM | POA: Diagnosis not present

## 2020-11-21 DIAGNOSIS — Z1211 Encounter for screening for malignant neoplasm of colon: Secondary | ICD-10-CM

## 2020-11-21 DIAGNOSIS — Z902 Acquired absence of lung [part of]: Secondary | ICD-10-CM | POA: Diagnosis not present

## 2020-11-21 DIAGNOSIS — Z880 Allergy status to penicillin: Secondary | ICD-10-CM | POA: Diagnosis not present

## 2020-11-21 HISTORY — PX: COLONOSCOPY WITH PROPOFOL: SHX5780

## 2020-11-21 HISTORY — PX: POLYPECTOMY: SHX5525

## 2020-11-21 SURGERY — COLONOSCOPY WITH PROPOFOL
Anesthesia: General

## 2020-11-21 MED ORDER — PROPOFOL 10 MG/ML IV BOLUS
INTRAVENOUS | Status: DC | PRN
  Administered 2020-11-21: 100 mg via INTRAVENOUS
  Administered 2020-11-21: 125 ug/kg/min via INTRAVENOUS

## 2020-11-21 MED ORDER — STERILE WATER FOR IRRIGATION IR SOLN
Status: DC | PRN
  Administered 2020-11-21: 100 mL

## 2020-11-21 MED ORDER — LIDOCAINE HCL (CARDIAC) PF 100 MG/5ML IV SOSY
PREFILLED_SYRINGE | INTRAVENOUS | Status: DC | PRN
  Administered 2020-11-21: 30 mg via INTRAVENOUS

## 2020-11-21 MED ORDER — LACTATED RINGERS IV SOLN: INTRAVENOUS | Status: DC

## 2020-11-21 NOTE — Anesthesia Preprocedure Evaluation (Signed)
Anesthesia Evaluation  Patient identified by MRN, date of birth, ID band Patient awake    Reviewed: Allergy & Precautions, NPO status , Patient's Chart, lab work & pertinent test results, reviewed documented beta blocker date and time   History of Anesthesia Complications Negative for: history of anesthetic complications  Airway Mallampati: II  TM Distance: >3 FB Neck ROM: Full    Dental  (+) Dental Advisory Given, Missing   Pulmonary shortness of breath, with exertion and Long-Term Oxygen Therapy, pneumonia, COPD,  COPD inhaler, former smoker,  Left lung cancer, lobectomy    Pulmonary exam normal breath sounds clear to auscultation       Cardiovascular hypertension, Pt. on medications and Pt. on home beta blockers Normal cardiovascular exam Rhythm:Regular Rate:Normal     Neuro/Psych    GI/Hepatic negative GI ROS, Neg liver ROS,   Endo/Other  negative endocrine ROS  Renal/GU negative Renal ROS     Musculoskeletal  (+) Arthritis ,   Abdominal   Peds  Hematology negative hematology ROS (+)   Anesthesia Other Findings   Reproductive/Obstetrics                            Anesthesia Physical Anesthesia Plan  ASA: 3  Anesthesia Plan: General   Post-op Pain Management:    Induction:   PONV Risk Score and Plan: Propofol infusion  Airway Management Planned: Nasal Cannula and Natural Airway  Additional Equipment:   Intra-op Plan:   Post-operative Plan:   Informed Consent: I have reviewed the patients History and Physical, chart, labs and discussed the procedure including the risks, benefits and alternatives for the proposed anesthesia with the patient or authorized representative who has indicated his/her understanding and acceptance.     Dental advisory given  Plan Discussed with: CRNA and Surgeon  Anesthesia Plan Comments:        Anesthesia Quick Evaluation

## 2020-11-21 NOTE — Transfer of Care (Signed)
Immediate Anesthesia Transfer of Care Note  Patient: Yolanda Bauer  Procedure(s) Performed: COLONOSCOPY WITH PROPOFOL POLYPECTOMY  Patient Location: Short Stay  Anesthesia Type:General  Level of Consciousness: awake, alert , oriented and patient cooperative  Airway & Oxygen Therapy: Patient Spontanous Breathing  Post-op Assessment: Report given to RN, Post -op Vital signs reviewed and stable and Patient moving all extremities  Post vital signs: Reviewed and stable  Last Vitals:  Vitals Value Taken Time  BP    Temp    Pulse    Resp    SpO2      Last Pain:  Vitals:   11/21/20 1049  TempSrc:   PainSc: 0-No pain         Complications: No notable events documented.

## 2020-11-21 NOTE — Discharge Instructions (Addendum)
  Colonoscopy Discharge Instructions  Read the instructions outlined below and refer to this sheet in the next few weeks. These discharge instructions provide you with general information on caring for yourself after you leave the hospital. Your doctor may also give you specific instructions. While your treatment has been planned according to the most current medical practices available, unavoidable complications occasionally occur.   ACTIVITY You may resume your regular activity, but move at a slower pace for the next 24 hours.  Take frequent rest periods for the next 24 hours.  Walking will help get rid of the air and reduce the bloated feeling in your belly (abdomen).  No driving for 24 hours (because of the medicine (anesthesia) used during the test).   Do not sign any important legal documents or operate any machinery for 24 hours (because of the anesthesia used during the test).  NUTRITION Drink plenty of fluids.  You may resume your normal diet as instructed by your doctor.  Begin with a light meal and progress to your normal diet. Heavy or fried foods are harder to digest and may make you feel sick to your stomach (nauseated).  Avoid alcoholic beverages for 24 hours or as instructed.  MEDICATIONS You may resume your normal medications unless your doctor tells you otherwise.  WHAT YOU CAN EXPECT TODAY Some feelings of bloating in the abdomen.  Passage of more gas than usual.  Spotting of blood in your stool or on the toilet paper.  IF YOU HAD POLYPS REMOVED DURING THE COLONOSCOPY: No aspirin products for 7 days or as instructed.  No alcohol for 7 days or as instructed.  Eat a soft diet for the next 24 hours.  FINDING OUT THE RESULTS OF YOUR TEST Not all test results are available during your visit. If your test results are not back during the visit, make an appointment with your caregiver to find out the results. Do not assume everything is normal if you have not heard from your  caregiver or the medical facility. It is important for you to follow up on all of your test results.  SEEK IMMEDIATE MEDICAL ATTENTION IF: You have more than a spotting of blood in your stool.  Your belly is swollen (abdominal distention).  You are nauseated or vomiting.  You have a temperature over 101.  You have abdominal pain or discomfort that is severe or gets worse throughout the day.   Your colonoscopy revealed 2 polyp(s) which I removed successfully. Await pathology results, my office will contact you. I recommend repeating colonoscopy in 5 years for surveillance purposes. Otherwise follow up with GI as needed.    I hope you have a great rest of your week!  Charles K. Carver, D.O. Gastroenterology and Hepatology [facility]  

## 2020-11-21 NOTE — Anesthesia Postprocedure Evaluation (Signed)
Anesthesia Post Note  Patient: Yolanda Bauer  Procedure(s) Performed: COLONOSCOPY WITH PROPOFOL POLYPECTOMY  Patient location during evaluation: Phase II Anesthesia Type: General Level of consciousness: awake and alert and oriented Pain management: pain level controlled Vital Signs Assessment: post-procedure vital signs reviewed and stable Respiratory status: spontaneous breathing and respiratory function stable Cardiovascular status: blood pressure returned to baseline and stable Postop Assessment: no apparent nausea or vomiting Anesthetic complications: no   No notable events documented.   Last Vitals:  Vitals:   11/21/20 1027 11/21/20 1113  BP: (!) 148/76 99/63  Pulse: 80 73  Resp: 15 (!) 22  Temp: 36.6 C 36.9 C  SpO2: 94% 99%    Last Pain:  Vitals:   11/21/20 1113  TempSrc: Axillary  PainSc: 0-No pain                 Shaan Rhoads C Roran Wegner

## 2020-11-21 NOTE — H&P (Signed)
Primary Care Physician:  Denny Levy, [place] Primary Gastroenterologist:  Dr. Abbey Chatters  Pre-Procedure History & Physical: HPI:  Yolanda Bauer is a 75 y.o. female is here for a colonoscopy to be performed for colon cancer screening purposes. 10 year recall, Last TCS 08/28/2010 by Dr. 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    Past Surgical History:  Procedure Laterality Date   BACK SURGERY     CATARACT EXTRACTION, BILATERAL Bilateral    HERNIA REPAIR     INTERCOSTAL NERVE BLOCK Left 04/11/2020   Procedure: INTERCOSTAL NERVE BLOCK;  Surgeon: [name], MD;  Location: [place];  Service: Thoracic;  Laterality: Left;   NODE DISSECTION Left 04/11/2020   Procedure: NODE DISSECTION;  Surgeon: [name], MD;  Location: [facility];  Service: Thoracic;  Laterality: Left;   TUBAL LIGATION     XI ROBOTIC ASSISTED THORACOSCOPY- SEGMENTECTOMY Left 04/11/2020   Procedure: XI ROBOTIC ASSISTED THORACOSCOPY-LEFT LOWER LOBE SUPERIOR SEGMENTECTOMY;  Surgeon: [name], MD;  Location: [place];  Service: Thoracic;  Laterality: Left;    Prior to Admission medications   Medication Sig Start Date End Date Taking? Authorizing Provider  acetaminophen (TYLENOL) 325 MG tablet Take 650 mg by mouth every 6 (six) hours as needed for moderate pain.   Yes [provider]  albuterol (VENTOLIN HFA) 108 (90 Base) MCG/ACT inhaler Inhale 2 puffs into the lungs every 6 (six) hours as needed for wheezing. 06/09/20  Yes [name], MD  Ascorbic Acid (VITAMIN C) 500 MG tablet Take 500 mg by mouth daily.   Yes [provider]  aspirin 81 MG tablet Take 81 mg by mouth daily.   Yes [provider]  bismuth subsalicylate (PEPTO BISMOL) 262 MG/15ML suspension Take 30 mLs by mouth every 6 (six) hours as needed for  indigestion.   Yes [provider]  Coral Calcium 1000 (390 CA) MG TABS Take 1,000 mg by mouth every other day.   Yes [provider]  fluticasone (FLONASE) 50 MCG/ACT nasal spray Place 1 spray into both nostrils daily as needed for allergies.    Yes [provider]  Fluticasone-Umeclidin-Vilant (TRELEGY ELLIPTA) 100-62.5-25 MCG/INH AEPB Inhale 1 puff into the lungs daily. 09/06/20  Yes Tanda Rockers, MD  Glycerin-Hypromellose-PEG 400 (DRY EYE RELIEF DROPS OP) Place 1 drop into both eyes daily as needed (dry eyes).   Yes [provider]  guaiFENesin (MUCINEX) 600 MG 12 hr tablet Take 1 tablet (600 mg total) by mouth 2 (two) times daily. Patient taking differently: Take 600 mg by mouth daily as needed (congestion). 04/14/20  Yes Barrett, Erin R, PA-C  loratadine (CLARITIN) 10 MG tablet Take 10 mg by mouth daily.   Yes [provider]  Menthol, Topical Analgesic, (ICY HOT EX) Apply 1 application topically daily as needed (muscle pain).   Yes [provider]  metoprolol succinate (TOPROL-XL) 50 MG 24 hr tablet Take 50 mg by mouth daily. Take with or immediately following a meal.   Yes [provider]  Multiple Vitamin (MULTIVITAMIN) tablet Take 1 tablet by mouth every other day.   Yes [provider]  olmesartan-hydrochlorothiazide (BENICAR HCT) 20-12.5 MG tablet Take 1 tablet by mouth daily. 07/27/20  Yes Tanda Rockers, MD  pravastatin (  PRAVACHOL) 40 MG tablet Take 40 mg by mouth at bedtime.    Yes [provider]    Allergies as of 10/10/2020 - Review Complete 09/21/2020  Allergen Reaction Noted   Penicillins Hives 08/15/2010    History reviewed. No pertinent family history.  Social History   Socioeconomic History   Marital status: Divorced    Spouse name: Not on file   Number of children: Not on file   Years of education: Not on file   Highest education level: Not on file  Occupational History   Not on  file  Tobacco Use   Smoking status: Former    Packs/day: 1.00    Years: 45.00    Pack years: 45.00    Types: Cigarettes    Quit date: 03/08/2020    Years since quitting: 0.7   Smokeless tobacco: Never  Vaping Use   Vaping Use: Never used  Substance and Sexual Activity   Alcohol use: Never   Drug use: Never   Sexual activity: Not Currently  Other Topics Concern   Not on file  Social History Narrative   Not on file   Social Determinants of Health   Financial Resource Strain: Not on file  Food Insecurity: Not on file  Transportation Needs: Not on file  Physical Activity: Not on file  Stress: Not on file  Social Connections: Not on file  Intimate Partner Violence: Not on file    Review of Systems: See HPI, otherwise negative ROS  Physical Exam: Vital signs in last 24 hours: Temp:  [97.9 F (36.6 C)] 97.9 F (36.6 C) (07/18 1027) Pulse Rate:  [80] 80 (07/18 1027) Resp:  [15] 15 (07/18 1027) BP: (148)/(76) 148/76 (07/18 1027) SpO2:  [94 %] 94 % (07/18 1027)   General:   Alert,  Well-developed, well-nourished, pleasant and cooperative in NAD Head:  Normocephalic and atraumatic. Eyes:  Sclera clear, no icterus.   Conjunctiva pink. Ears:  Normal auditory acuity. Nose:  No deformity, discharge,  or lesions. Mouth:  No deformity or lesions, dentition normal. Neck:  Supple; no masses or thyromegaly. Lungs:  Clear throughout to auscultation.   No wheezes, crackles, or rhonchi. No acute distress. Heart:  Regular rate and rhythm; no murmurs, clicks, rubs,  or gallops. Abdomen:  Soft, nontender and nondistended. No masses, hepatosplenomegaly or hernias noted. Normal bowel sounds, without guarding, and without rebound.   Msk:  Symmetrical without gross deformities. Normal posture. Extremities:  Without clubbing or edema. Neurologic:  Alert and  oriented x4;  grossly normal neurologically. Skin:  Intact without significant lesions or rashes. Cervical Nodes:  No significant  cervical adenopathy. Psych:  Alert and cooperative. Normal mood and affect.  Impression/Plan: Yolanda Bauer is here for a colonoscopy to be performed for colon cancer screening purposes. 10 year recall, Last TCS 08/28/2010 by Dr. Oneida Alar, no polyps, family hx of colon cancer (uncle)  The risks of the procedure including infection, bleed, or perforation as well as benefits, limitations, alternatives and imponderables have been reviewed with the patient. Questions have been answered. All parties agreeable.

## 2020-11-21 NOTE — Op Note (Signed)
[facility] Patient Name: Yolanda Bauer Procedure Date: 11/21/2020 10:33 AM MRN: [id number] Date of Birth: May 16, 1945 Attending MD: Elon Alas. Abbey Chatters DO CSN: [id number] Age: 75 Admit Type: Outpatient Procedure:                Colonoscopy Indications:              Screening for colorectal malignant neoplasm Providers:                Elon Alas. Abbey Chatters, DO, Tammy Vaught, RN, Randa Spike, Technician Referring MD:              Medicines:                See the Anesthesia note for documentation of the                            administered medications Complications:            No immediate complications. Estimated Blood Loss:     Estimated blood loss was minimal. Procedure:                Pre-Anesthesia Assessment:                           - The anesthesia plan was to use monitored                            anesthesia care (MAC).                           After obtaining informed consent, the colonoscope                            was passed under direct vision. Throughout the                            procedure, the patient's blood pressure, pulse, and                            oxygen saturations were monitored continuously. The                            PCF-HQ190L ([id number]) scope was introduced through                            the anus and advanced to the the cecum, identified                            by appendiceal orifice and ileocecal valve. The                            colonoscopy was performed without difficulty. The                            patient tolerated the procedure well. The  quality                            of the bowel preparation was evaluated using the                            BBPS [facility] Bowel Preparation Scale) with scores                            of: Right Colon = 3, Transverse Colon = 3 and Left                            Colon = 3 (entire mucosa seen well with no residual                            staining,  small fragments of stool or opaque                            liquid). The total BBPS score equals 9. Scope In: 10:52:57 AM Scope Out: 11:09:31 AM Scope Withdrawal Time: 0 hours 12 minutes 34 seconds  Total Procedure Duration: 0 hours 16 minutes 34 seconds  Findings:      The perianal and digital rectal examinations were normal.      Non-bleeding internal hemorrhoids were found during endoscopy.      A few small-mouthed diverticula were found in the sigmoid colon.      A 4 mm polyp was found in the ascending colon. The polyp was sessile.       The polyp was removed with a cold snare. Resection and retrieval were       complete.      A 3 mm polyp was found in the descending colon. The polyp was sessile.       The polyp was removed with a cold snare. Resection and retrieval were       complete. Impression:               - Non-bleeding internal hemorrhoids.                           - Diverticulosis in the sigmoid colon.                           - One 4 mm polyp in the ascending colon, removed                            with a cold snare. Resected and retrieved.                           - One 3 mm polyp in the descending colon, removed                            with a cold snare. Resected and retrieved. Moderate Sedation:      Per Anesthesia Care Recommendation:           - Patient has a contact number available for  emergencies. The signs and symptoms of potential                            delayed complications were discussed with the                            patient. Return to normal activities tomorrow.                            Written discharge instructions were provided to the                            patient.                           - Resume previous diet.                           - Continue present medications.                           - Await pathology results.                           - Repeat colonoscopy in 5 years for surveillance.                            - Return to GI clinic PRN. Procedure Code(s):        --- Professional ---                           [phone], Colonoscopy, flexible; with removal of                            tumor(s), polyp(s), or other lesion(s) by snare                            technique Diagnosis Code(s):        --- Professional ---                           Z12.11, Encounter for screening for malignant                            neoplasm of colon                           K64.8, Other hemorrhoids                           K63.5, Polyp of colon                           K57.30, Diverticulosis of large intestine without                            perforation or abscess without bleeding CPT copyright 2019 American Medical  Association. All rights reserved. The codes documented in this report are preliminary and upon coder review may  be revised to meet current compliance requirements. Elon Alas. Abbey Chatters, DO [place] Abbey Chatters, DO 11/21/2020 11:11:53 AM This report has been signed electronically. Number of Addenda: 0

## 2020-11-22 LAB — SURGICAL PATHOLOGY

## 2020-11-23 ENCOUNTER — Encounter (HOSPITAL_COMMUNITY): Payer: Self-pay | Admitting: Internal Medicine

## 2020-12-16 ENCOUNTER — Other Ambulatory Visit: Payer: Self-pay | Admitting: Internal Medicine

## 2020-12-17 ENCOUNTER — Other Ambulatory Visit: Payer: Self-pay | Admitting: *Deleted

## 2020-12-17 MED ORDER — OLMESARTAN MEDOXOMIL-HCTZ 20-12.5 MG PO TABS: 1.0000 | ORAL_TABLET | Freq: Every day | ORAL | 3 refills | Status: DC

## 2020-12-26 ENCOUNTER — Telehealth: Payer: Self-pay | Admitting: Internal Medicine

## 2020-12-26 MED ORDER — OLMESARTAN MEDOXOMIL-HCTZ 20-12.5 MG PO TABS: 1.0000 | ORAL_TABLET | Freq: Every day | ORAL | 3 refills | Status: DC

## 2020-12-26 NOTE — Telephone Encounter (Signed)
Called and spoke with patient who states the RX for Benicar was supposed to be sent to [facility]. New RX has been sent to correct pharmacy. Nothing further needed at this time.

## 2021-03-06 ENCOUNTER — Other Ambulatory Visit: Payer: Self-pay

## 2021-03-06 ENCOUNTER — Encounter: Payer: Self-pay | Admitting: Internal Medicine

## 2021-03-06 ENCOUNTER — Ambulatory Visit: Payer: Medicare Other | Admitting: Internal Medicine

## 2021-03-06 DIAGNOSIS — J449 Chronic obstructive pulmonary disease, unspecified: Secondary | ICD-10-CM

## 2021-03-06 DIAGNOSIS — J9611 Chronic respiratory failure with hypoxia: Secondary | ICD-10-CM

## 2021-03-06 MED ORDER — ALBUTEROL SULFATE HFA 108 (90 BASE) MCG/ACT IN AERS: 2.0000 | INHALATION_SPRAY | Freq: Four times a day (QID) | RESPIRATORY_TRACT | 6 refills | Status: DC | PRN

## 2021-03-06 NOTE — Patient Instructions (Signed)
To get the most out of exercise, you need to be continuously aware that you are short of breath, but never out of breath, for at least 30 minutes daily. As you improve, it will actually be easier for you to do the same amount of exercise  in  30 minutes so always push to the level where you are short of breath.    Make sure your 0xygen level does not drop below 90% at peak exercise   Please schedule a follow up visit in 6  months but call sooner if needed

## 2021-03-06 NOTE — Progress Notes (Signed)
Yolanda Bauer, female    DOB: 1945/09/02    MRN: [id number]   Brief patient profile:  51  yobf quit smoking 03/2020 with hbp on ACEi with noct "wheeze" on prn albuterol helped some referred to pulmonary clinic in [place]  01/27/2020 by Dr Yolanda Levy PA re SPN  LLL sup segment.    History of Present Illness  01/27/2020  Pulmonary/ 1st office eval/Yolanda Bauer  Chief Complaint  Patient presents with   Consult    productive cough with white phlegm in the mornings  Dyspnea:  No steps at her home but Not limited by breathing from desired activities   Cough: some am's / no hemoptysis/ some "wheeze at hs  Sleep: bed is flat/ one pillow  SABA use: none 02 none   rec Stop lisinopril and instead take olmesartan 20- 12.5 one daily  The key is to stop smoking completely before smoking completely stops you! My office will call you to schedule you a PET scan next available and I will call you with the results   04/11/20 L superior segmentectomy [facility]):  T2, N0, stage Ib squamous cell carcinoma.    07/28/2020  f/u ov/Yolanda Bauer office/Yolanda Bauer re: s/p L sup  Segmentectomy  Chief Complaint  Patient presents with   Follow-up    Patient states has "shortness of breath sometimes but not as much as she used to"   Essential hypertension D/c acei 01/27/2020 due to report of noct "wheeze"   Dyspnea: able to shop [employer] sometimes s 02 /can't name one activity  Cough: nasal congestion  Sleeping: R side down/ bed is flat/ 2 pillows  SABA use: saba rarely  02: 2 lpm  Covid status: vax x 3 Lung cancer screening: n/a  rec Plan A = Automatic = Always=    Trelegy 100 each am  Work on inhaler technique:   Plan B = Backup (to supplement plan A, not to replace it) Only use your albuterol inhaler as a rescue medication  Continue 02 is 2lpm at bedtime and then during the day keep over  90%  Make sure you check your oxygen saturation  at your highest level of activity  to be sure it stays over 90%       03/06/2021  f/u ov/Yolanda Bauer office/Yolanda Bauer re: gold 3 copd  maint on trelegy   Chief Complaint  Patient presents with   Follow-up    2L O2 cont. At home. Uses trelegy inhaler and requesting refill on albuterol inhaler. Pt reports SOB has improved since last ov.   Dyspnea:  [employer] pushing the cart, [place] walk short of breath sats 92% s 02 per pt  Cough: not much at all  Sleeping: flat bed, 2 pillows  SABA use: 2-3 x per week 02: 2lpms hs / prn daytime  Covid status: vax x 3      No obvious day to day or daytime variability or assoc excess/ purulent sputum or mucus plugs or hemoptysis or cp or chest tightness, subjective wheeze or overt sinus or hb symptoms.   Sleeping as above without nocturnal  or early am exacerbation  of respiratory  c/o's or need for noct saba. Also denies any obvious fluctuation of symptoms with weather or environmental changes or other aggravating or alleviating factors except as outlined above   No unusual exposure hx or h/o childhood pna/ asthma or knowledge of premature birth.  Current Allergies, Complete Past Medical History, Past Surgical History, Family History, and Social History were reviewed in [facility] electronic medical record.  ROS  The following are not active complaints unless bolded Hoarseness, sore throat, dysphagia, dental problems, itching, sneezing,  nasal congestion or discharge of excess mucus or purulent secretions, ear ache,   fever, chills, sweats, unintended wt loss or wt gain, classically pleuritic or exertional cp,  orthopnea pnd or arm/hand swelling  or leg swelling, presyncope, palpitations, abdominal pain, anorexia, nausea, vomiting, diarrhea  or change in bowel habits or change in bladder habits, change in stools or change in urine, dysuria, hematuria,  rash, arthralgias, visual complaints, headache, numbness, weakness or ataxia or problems with walking or coordination,  change in mood or  memory.        Current Meds   Medication Sig   acetaminophen (TYLENOL) 325 MG tablet Take 650 mg by mouth every 6 (six) hours as needed for moderate pain.   albuterol (VENTOLIN HFA) 108 (90 Base) MCG/ACT inhaler Inhale 2 puffs into the lungs every 6 (six) hours as needed for wheezing.   Ascorbic Acid (VITAMIN C) 500 MG tablet Take 500 mg by mouth daily.   aspirin 81 MG tablet Take 81 mg by mouth daily.   bismuth subsalicylate (PEPTO BISMOL) 262 MG/15ML suspension Take 30 mLs by mouth every 6 (six) hours as needed for indigestion.   Coral Calcium 1000 (390 CA) MG TABS Take 1,000 mg by mouth every other day.   fluticasone (FLONASE) 50 MCG/ACT nasal spray Place 1 spray into both nostrils daily as needed for allergies.    Fluticasone-Umeclidin-Vilant (TRELEGY ELLIPTA) 100-62.5-25 MCG/INH AEPB Inhale 1 puff into the lungs daily.   Glycerin-Hypromellose-PEG 400 (DRY EYE RELIEF DROPS OP) Place 1 drop into both eyes daily as needed (dry eyes).   guaiFENesin (MUCINEX) 600 MG 12 hr tablet Take 1 tablet (600 mg total) by mouth 2 (two) times daily. (Patient taking differently: Take 600 mg by mouth daily as needed (congestion).)   loratadine (CLARITIN) 10 MG tablet Take 10 mg by mouth daily.   Menthol, Topical Analgesic, (ICY HOT EX) Apply 1 application topically daily as needed (muscle pain).   metoprolol succinate (TOPROL-XL) 50 MG 24 hr tablet Take 50 mg by mouth daily. Take with or immediately following a meal.   Multiple Vitamin (MULTIVITAMIN) tablet Take 1 tablet by mouth every other day.   olmesartan-hydrochlorothiazide (BENICAR HCT) 20-12.5 MG tablet Take 1 tablet by mouth daily.   pravastatin (PRAVACHOL) 40 MG tablet Take 40 mg by mouth at bedtime.                        Past Medical History:  Diagnosis Date   Hyperlipidemia    Hypertension        Objective:     03/06/2021      180   09/06/2020          168  07/28/20 164 lb (74.4 kg)  07/19/20 160 lb (72.6 kg)  05/12/20 156 lb 14.4 oz (71.2 kg)  01/26/2010        148   Vital signs reviewed  03/06/2021  - Note at rest 02 sats  94% on RA   General appearance:    pleasant amb bf     HEENT : pt wearing mask not removed for exam due to covid - 19 concerns.   NECK :  without JVD/Nodes/TM/ nl carotid upstrokes bilaterally   LUNGS: no acc muscle use,  Min barrel  contour chest wall with bilateral  slightly decreased bs s audible wheeze and  without cough on insp or exp maneuvers and min  Hyperresonant  to  percussion bilaterally     CV:  RRR  no s3 or murmur or increase in P2, and no edema   ABD:  soft and nontender with pos end  insp Hoover's  in the supine position. No bruits or organomegaly appreciated, bowel sounds nl  MS:   Nl gait/  ext warm without deformities, calf tenderness, cyanosis or clubbing No obvious joint restrictions   SKIN: warm and dry without lesions    NEURO:  alert, approp, nl sensorium with  no motor or cerebellar deficits apparent.              Assessment

## 2021-03-07 ENCOUNTER — Encounter: Payer: Self-pay | Admitting: Internal Medicine

## 2021-03-07 NOTE — Assessment & Plan Note (Signed)
Quit smoking 03/15/2020 - PFT's  03/15/20  FEV1 0.90 (44 % ) ratio 0.54  p 3 % improvement from saba p 0 prior to study with DLCO  12.22 (84%) corrects to 3.59 (85%)  for alv volume and FV curve classic concave contour on exp portion   - 07/28/2020  After extensive coaching inhaler device,  effectiveness =    0 with hfa - 07/28/2020  After extensive coaching inhaler device,  effectiveness =    75% with elipta with short ti > try trelegy > improved 09/06/2020 > continue and f/u yearly in pulmonary clinic in [place]   Group D in terms of symptom/risk and laba/lama/ICS  therefore appropriate rx at this point >>>  Continue trelegy 100 qd

## 2021-03-07 NOTE — Assessment & Plan Note (Signed)
As of surgery 04/11/20 02 2lpm  07/28/2020   Walked RA  approx   400 ft  @ mod fast pace  stopped due to  Sob/ sats 90%    -  09/06/2020   Walked RA  approx   400 ft  @ mod pace  stopped due to min sob with sats 90%  - 03/06/2021   Walked on RA x  3  lap(s) =  approx 450 @ moderate pace, stopped due to end of study, sob @ 2nd lap  with lowest 02 sats 92%   As of 03/06/2021  2lpm hs and prn if sats < 90% with ex  Needs to do more  paced activity to maintain conditioning   Each maintenance medication was reviewed in detail including emphasizing most importantly the difference between maintenance and prns and under what circumstances the prns are to be triggered using an action plan format where appropriate.  Total time for H and P, chart review, counseling, reviewing dpi/haf device(s) , directly observing portions of ambulatory 02 saturation study/ and generating customized AVS unique to this office visit / same day charting = 22 min

## 2021-04-17 ENCOUNTER — Other Ambulatory Visit: Payer: Self-pay | Admitting: Thoracic Surgery (Cardiothoracic Vascular Surgery)

## 2021-04-17 DIAGNOSIS — C3432 Malignant neoplasm of lower lobe, left bronchus or lung: Secondary | ICD-10-CM

## 2021-04-18 ENCOUNTER — Ambulatory Visit: Payer: Medicare Other | Admitting: Thoracic Surgery (Cardiothoracic Vascular Surgery)

## 2021-04-18 ENCOUNTER — Other Ambulatory Visit: Payer: Self-pay

## 2021-04-18 ENCOUNTER — Ambulatory Visit
Admission: RE | Admit: 2021-04-18 | Discharge: 2021-04-18 | Disposition: A | Discharge status: Home / Self Care | Payer: Medicare Other | Source: Ambulatory Visit | Attending: Thoracic Surgery (Cardiothoracic Vascular Surgery) | Admitting: Thoracic Surgery (Cardiothoracic Vascular Surgery)

## 2021-04-18 ENCOUNTER — Encounter: Payer: Self-pay | Admitting: Thoracic Surgery (Cardiothoracic Vascular Surgery)

## 2021-04-18 VITALS — BP 197/80 | HR 80 | Resp 20 | Ht 61.0 in | Wt 178.0 lb

## 2021-04-18 DIAGNOSIS — C3432 Malignant neoplasm of lower lobe, left bronchus or lung: Secondary | ICD-10-CM

## 2021-04-18 DIAGNOSIS — Z902 Acquired absence of lung [part of]: Secondary | ICD-10-CM

## 2021-04-18 NOTE — Progress Notes (Signed)
Yolanda Bauer       [place] [address]             [phone]      HPI: 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    Nyhla Bauer is a 75 year old woman with a history of tobacco abuse, COPD, arthritis, pneumonia, hypertension, and hyperlipidemia.  She was found to have a left lower lobe Bauer mass on a low-dose screening CT.  She is not a candidate for lobectomy due to her emphysema.  I did a robotic left lower lobe superior segmentectomy on 04/11/2020.  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  She did not require adjuvant therapy.  She recently saw Dr. Melvyn Bauer.  He started her on a new inhaler.  She says her respiratory status is stable.  She has been feeling well.  No change in appetite or weight loss.  No unusual headaches or visual changes.  Past Medical History:  Diagnosis Date   Anxiety    Arthritis    Dyspnea    Hyperlipidemia    Hypertension    Pneumonia    scl ca 03/2020    Current Outpatient Medications  Medication Sig Dispense Refill   acetaminophen (TYLENOL) 325 MG tablet Take 650 mg by mouth every 6 (six) hours as needed for moderate pain.     albuterol (VENTOLIN HFA) 108 (90 Base) MCG/ACT inhaler Inhale 2 puffs into the lungs every 6 (six) hours as needed for wheezing. 8 g 6   Ascorbic Acid (VITAMIN C) 500 MG tablet Take 500 mg by mouth daily.     aspirin 81 MG tablet Take 81 mg by mouth daily.     bismuth subsalicylate (PEPTO BISMOL) 262 MG/15ML suspension Take 30 mLs by mouth every 6 (six) hours as needed for indigestion.     Coral Calcium 1000 (390 CA) MG TABS Take 1,000 mg by mouth every other day.     fluticasone (FLONASE) 50 MCG/ACT nasal spray Place 1 spray into both nostrils daily as needed for allergies.      Fluticasone-Umeclidin-Vilant (TRELEGY ELLIPTA) 100-62.5-25 MCG/INH AEPB Inhale 1 puff into the lungs daily. 3 each 3   Glycerin-Hypromellose-PEG 400 (DRY EYE RELIEF DROPS OP)  Place 1 drop into both eyes daily as needed (dry eyes).     guaiFENesin (MUCINEX) 600 MG 12 hr tablet Take 1 tablet (600 mg total) by mouth 2 (two) times daily. (Patient taking differently: Take 600 mg by mouth daily as needed (congestion).)     loratadine (CLARITIN) 10 MG tablet Take 10 mg by mouth daily.     Menthol, Topical Analgesic, (ICY HOT EX) Apply 1 application topically daily as needed (muscle pain).     metoprolol succinate (TOPROL-XL) 50 MG 24 hr tablet Take 50 mg by mouth daily. Take with or immediately following a meal.     Multiple Vitamin (MULTIVITAMIN) tablet Take 1 tablet by mouth every other day.     olmesartan-hydrochlorothiazide (BENICAR HCT) 20-12.5 MG tablet Take 1 tablet by mouth daily. 90 tablet 3   pravastatin (PRAVACHOL) 40 MG tablet Take 40 mg by mouth at bedtime.      No current facility-administered medications for this visit.    Physical Exam BP (!) 197/80    Pulse 80    Resp 20    Ht 5\' 1"  (1.549 m)    Wt 178 lb (80.7 kg)    SpO2  93% Comment: RA   BMI 33.36 kg/m  75 year old woman in no acute distress Well-developed and well-nourished Alert and oriented x3 with no focal deficits Lungs clear with equal breath sounds bilaterally Incisions well-healed No cervical or supraclavicular adenopathy Cardiac regular rate and rhythm  Diagnostic Tests: CHEST - 2 VIEW   COMPARISON:  July 19, 2020.   FINDINGS: Post LEFT lower lobectomy. Mild scarring in the LEFT mid chest versus linear atelectasis. No sign of effusion. No lobar consolidative process.   Mild shift of mediastinal structures into the LEFT chest as before. Hilar structures and mediastinal contours are unremarkable.   On limited assessment there is no acute skeletal process.   IMPRESSION: No acute cardiopulmonary disease.     Electronically Signed   By: Yolanda Bauer M.D.   On: 04/18/2021 13:07 I personally reviewed the chest x-ray images.  Postop changes, no suspicious  findings.  Impression: Yolanda Bauer is a 74 year old woman with a history of tobacco abuse, COPD, arthritis, pneumonia, hypertension, and hyperlipidemia.  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  That was resected with a superior segmentectomy.  She was not a candidate for lobectomy due to her emphysema.  She is now a year out from surgery with no evidence of recurrent disease.  She is being followed by Dr. Julien Bauer and we will see him back next month with a CT of the chest.  Dr. Melvyn Bauer is following her COPD.  Plan: Recommended she go to urgent care for management of her hypertension Follow-up with Dr. Melvyn Bauer and Dr. Julien Bauer I will be happy to see her back anytime in the future if I can be of any further assistance with her care.  I spent 20 minutes in review of records, images, and in consultation with Yolanda Bauer today. Yolanda Nakayama, MD [facility] [phone]

## 2021-04-18 NOTE — Progress Notes (Deleted)
°  HPI:  Patient returns for routine postoperative follow-up having undergone  The patient's early postoperative recovery while in the hospital was notable for Since hospital discharge the patient reports   Current Outpatient Medications  Medication Sig Dispense Refill   acetaminophen (TYLENOL) 325 MG tablet Take 650 mg by mouth every 6 (six) hours as needed for moderate pain.     albuterol (VENTOLIN HFA) 108 (90 Base) MCG/ACT inhaler Inhale 2 puffs into the lungs every 6 (six) hours as needed for wheezing. 8 g 6   Ascorbic Acid (VITAMIN C) 500 MG tablet Take 500 mg by mouth daily.     aspirin 81 MG tablet Take 81 mg by mouth daily.     bismuth subsalicylate (PEPTO BISMOL) 262 MG/15ML suspension Take 30 mLs by mouth every 6 (six) hours as needed for indigestion.     Coral Calcium 1000 (390 CA) MG TABS Take 1,000 mg by mouth every other day.     fluticasone (FLONASE) 50 MCG/ACT nasal spray Place 1 spray into both nostrils daily as needed for allergies.      Fluticasone-Umeclidin-Vilant (TRELEGY ELLIPTA) 100-62.5-25 MCG/INH AEPB Inhale 1 puff into the lungs daily. 3 each 3   Glycerin-Hypromellose-PEG 400 (DRY EYE RELIEF DROPS OP) Place 1 drop into both eyes daily as needed (dry eyes).     guaiFENesin (MUCINEX) 600 MG 12 hr tablet Take 1 tablet (600 mg total) by mouth 2 (two) times daily. (Patient taking differently: Take 600 mg by mouth daily as needed (congestion).)     loratadine (CLARITIN) 10 MG tablet Take 10 mg by mouth daily.     Menthol, Topical Analgesic, (ICY HOT EX) Apply 1 application topically daily as needed (muscle pain).     metoprolol succinate (TOPROL-XL) 50 MG 24 hr tablet Take 50 mg by mouth daily. Take with or immediately following a meal.     Multiple Vitamin (MULTIVITAMIN) tablet Take 1 tablet by mouth every other day.     olmesartan-hydrochlorothiazide (BENICAR HCT) 20-12.5 MG tablet Take 1 tablet by mouth daily. 90 tablet 3   pravastatin (PRAVACHOL) 40 MG tablet Take 40  mg by mouth at bedtime.      No current facility-administered medications for this visit.    Physical Exam  Diagnostic Tests:   Impression:  Plan:   [name], MD [facility] [phone]

## 2021-05-12 ENCOUNTER — Other Ambulatory Visit: Payer: Self-pay

## 2021-05-12 ENCOUNTER — Inpatient Hospital Stay: Payer: Medicare Other | Attending: Internal Medicine

## 2021-05-12 DIAGNOSIS — Z85118 Personal history of other malignant neoplasm of bronchus and lung: Secondary | ICD-10-CM | POA: Diagnosis not present

## 2021-05-12 DIAGNOSIS — C3490 Malignant neoplasm of unspecified part of unspecified bronchus or lung: Secondary | ICD-10-CM

## 2021-05-12 LAB — CMP (CANCER CENTER ONLY)
ALT: 14 U/L (ref 0–44)
AST: 19 U/L (ref 15–41)
Albumin: 3.8 g/dL (ref 3.5–5.0)
Alkaline Phosphatase: 53 U/L (ref 38–126)
Anion gap: 6 (ref 5–15)
BUN: 15 mg/dL (ref 8–23)
CO2: 32 mmol/L (ref 22–32)
Calcium: 9.6 mg/dL (ref 8.9–10.3)
Chloride: 103 mmol/L (ref 98–111)
Creatinine: 0.7 mg/dL (ref 0.44–1.00)
GFR, Estimated: >60 mL/min (ref >60)
Glucose, Bld: 98 mg/dL (ref 70–99)
Potassium: 3.7 mmol/L (ref 3.5–5.1)
Sodium: 141 mmol/L (ref 135–145)
Total Bilirubin: 0.3 mg/dL (ref 0.3–1.2)
Total Protein: 7.2 g/dL (ref 6.5–8.1)

## 2021-05-12 LAB — CBC WITH DIFFERENTIAL (CANCER CENTER ONLY)
Abs Immature Granulocytes: 0.01 10*3/uL (ref 0.00–0.07)
Basophils Absolute: 0 10*3/uL (ref 0.0–0.1)
Basophils Relative: 1 %
Eosinophils Absolute: 0.1 10*3/uL (ref 0.0–0.5)
Eosinophils Relative: 4 %
HCT: 38.4 % (ref 36.0–46.0)
Hemoglobin: 12.4 g/dL (ref 12.0–15.0)
Immature Granulocytes: 0 %
Lymphocytes Relative: 33 %
Lymphs Abs: 1.2 10*3/uL (ref 0.7–4.0)
MCH: 28.6 pg (ref 26.0–34.0)
MCHC: 32.3 g/dL (ref 30.0–36.0)
MCV: 88.5 fL (ref 80.0–100.0)
Monocytes Absolute: 0.6 10*3/uL (ref 0.1–1.0)
Monocytes Relative: 16 %
Neutro Abs: 1.7 10*3/uL (ref 1.7–7.7)
Neutrophils Relative %: 46 %
Platelet Count: 278 10*3/uL (ref 150–400)
RBC: 4.34 MIL/uL (ref 3.87–5.11)
RDW: 14.6 % (ref 11.5–15.5)
WBC Count: 3.7 10*3/uL — ABNORMAL LOW (ref 4.0–10.5)
nRBC: 0 % (ref 0.0–0.2)

## 2021-05-15 ENCOUNTER — Inpatient Hospital Stay: Payer: Medicare Other | Admitting: Internal Medicine

## 2021-05-15 ENCOUNTER — Encounter: Payer: Self-pay | Admitting: Internal Medicine

## 2021-05-15 ENCOUNTER — Other Ambulatory Visit: Payer: Self-pay

## 2021-05-15 VITALS — BP 178/72 | HR 82 | Temp 98.4°F | Resp 17 | Ht 61.0 in | Wt 175.3 lb

## 2021-05-15 DIAGNOSIS — C3490 Malignant neoplasm of unspecified part of unspecified bronchus or lung: Secondary | ICD-10-CM | POA: Diagnosis not present

## 2021-05-15 DIAGNOSIS — Z85118 Personal history of other malignant neoplasm of bronchus and lung: Secondary | ICD-10-CM | POA: Diagnosis not present

## 2021-05-15 NOTE — Progress Notes (Signed)
[place] [phone]   [phone]  OFFICE PROGRESS NOTE  [address], [place] [address]  DIAGNOSIS: Stage IB (T2 a, N0, M0) non-small cell lung cancer, squamous cell carcinoma diagnosed in November 2021.  PRIOR THERAPY: status post left lower lobe superior segmentectomy with lymph node dissection under the care of Dr. Roxan Hockey on March 12, 2020 and the final size of the tumor was 3.9 cm with visceral pleural invasion  CURRENT THERAPY: Observation.  INTERVAL HISTORY: Yolanda Bauer 76 y.o. female returns to the clinic today for follow-up visit.  The patient is feeling fine today with no concerning complaints except for high blood pressure secondary to whitecoat syndrome.  She mentions that her blood pressure is usually within the normal range at home.  She denied having any current chest pain, shortness of breath, cough or hemoptysis.  She denied having any fever or chills.  She has no nausea, vomiting, diarrhea or constipation.  She has no headache or visual changes.  She was supposed to have repeat CT scan of the chest before this visit but unfortunately her scan scheduled to be done on June 05, 2021.  MEDICAL HISTORY: Past Medical History:  Diagnosis Date   Anxiety    Arthritis    Dyspnea    Hyperlipidemia    Hypertension    Pneumonia    scl ca 03/2020    ALLERGIES:  is allergic to penicillins.  MEDICATIONS:  Current Outpatient Medications  Medication Sig Dispense Refill   acetaminophen (TYLENOL) 325 MG tablet Take 650 mg by mouth every 6 (six) hours as needed for moderate pain.     albuterol (VENTOLIN HFA) 108 (90 Base) MCG/ACT inhaler Inhale 2 puffs into the lungs every 6 (six) hours as needed for wheezing. 8 g 6   Ascorbic Acid (VITAMIN C) 500 MG tablet Take 500 mg by mouth daily.     aspirin 81 MG tablet Take 81 mg by mouth daily.     bismuth subsalicylate (PEPTO BISMOL) 262 MG/15ML suspension Take 30  mLs by mouth every 6 (six) hours as needed for indigestion.     Coral Calcium 1000 (390 CA) MG TABS Take 1,000 mg by mouth every other day.     fluticasone (FLONASE) 50 MCG/ACT nasal spray Place 1 spray into both nostrils daily as needed for allergies.      Fluticasone-Umeclidin-Vilant (TRELEGY ELLIPTA) 100-62.5-25 MCG/INH AEPB Inhale 1 puff into the lungs daily. 3 each 3   Glycerin-Hypromellose-PEG 400 (DRY EYE RELIEF DROPS OP) Place 1 drop into both eyes daily as needed (dry eyes).     loratadine (CLARITIN) 10 MG tablet Take 10 mg by mouth daily.     Menthol, Topical Analgesic, (ICY HOT EX) Apply 1 application topically daily as needed (muscle pain).     metoprolol succinate (TOPROL-XL) 50 MG 24 hr tablet Take 50 mg by mouth daily. Take with or immediately following a meal.     Multiple Vitamin (MULTIVITAMIN) tablet Take 1 tablet by mouth every other day.     olmesartan-hydrochlorothiazide (BENICAR HCT) 20-12.5 MG tablet Take 1 tablet by mouth daily. 90 tablet 3   pravastatin (PRAVACHOL) 40 MG tablet Take 40 mg by mouth at bedtime.      guaiFENesin (MUCINEX) 600 MG 12 hr tablet Take 1 tablet (600 mg total) by mouth 2 (two) times daily. (Patient not taking: Reported on 05/15/2021)     No current facility-administered medications for this visit.  SURGICAL HISTORY:  Past Surgical History:  Procedure Laterality Date   BACK SURGERY     CATARACT EXTRACTION, BILATERAL Bilateral    COLONOSCOPY WITH PROPOFOL N/A 11/21/2020   Procedure: COLONOSCOPY WITH PROPOFOL;  Surgeon: Eloise Harman, DO;  [facility];  Service: Endoscopy;  Laterality: N/A;  ASA III / 11:00   HERNIA REPAIR     INTERCOSTAL NERVE BLOCK Left 04/11/2020   Procedure: INTERCOSTAL NERVE BLOCK;  Surgeon: [name], MD;  Location: [facility];  Service: Thoracic;  Laterality: Left;   NODE DISSECTION Left 04/11/2020   Procedure: NODE DISSECTION;  Surgeon: [name], MD;  Location: [facility];  Service:  Thoracic;  Laterality: Left;   POLYPECTOMY  11/21/2020   Procedure: POLYPECTOMY;  Surgeon: Eloise Harman, DO;  [facility];  Service: Endoscopy;;   TUBAL LIGATION     XI ROBOTIC ASSISTED THORACOSCOPY- SEGMENTECTOMY Left 04/11/2020   Procedure: XI ROBOTIC ASSISTED THORACOSCOPY-LEFT LOWER LOBE SUPERIOR SEGMENTECTOMY;  Surgeon: [name], MD;  [facility];  Service: Thoracic;  Laterality: Left;    REVIEW OF SYSTEMS:  A comprehensive review of systems was negative.   PHYSICAL EXAMINATION: General appearance: alert, cooperative, and no distress Head: Normocephalic, without obvious abnormality, atraumatic Neck: no adenopathy, no JVD, supple, symmetrical, trachea midline, and thyroid not enlarged, symmetric, no tenderness/mass/nodules Lymph nodes: Cervical, supraclavicular, and axillary nodes normal. Resp: clear to auscultation bilaterally Back: symmetric, no curvature. ROM normal. No CVA tenderness. Cardio: regular rate and rhythm, S1, S2 normal, no murmur, click, rub or gallop GI: soft, non-tender; bowel sounds normal; no masses,  no organomegaly Extremities: extremities normal, atraumatic, no cyanosis or edema  ECOG PERFORMANCE STATUS: 1 - Symptomatic but completely ambulatory  Blood pressure (!) 178/72, pulse 82, temperature 98.4 F (36.9 C), temperature source Temporal, resp. rate 17, height 5\' 1"  (1.549 m), weight 175 lb 4.8 oz (79.5 kg), SpO2 95 %.  LABORATORY DATA: Lab Results  Component Value Date   WBC 3.7 (L) 05/12/2021   HGB 12.4 05/12/2021   HCT 38.4 05/12/2021   MCV 88.5 05/12/2021   PLT 278 05/12/2021      Chemistry      Component Value Date/Time   NA 141 05/12/2021 0942   K 3.7 05/12/2021 0942   CL 103 05/12/2021 0942   CO2 32 05/12/2021 0942   BUN 15 05/12/2021 0942   CREATININE 0.70 05/12/2021 0942      Component Value Date/Time   CALCIUM 9.6 05/12/2021 0942   ALKPHOS 53 05/12/2021 0942   AST 19 05/12/2021 0942   ALT 14  05/12/2021 0942   BILITOT 0.3 05/12/2021 0942       RADIOGRAPHIC STUDIES: DG Chest 2 View  Result Date: 04/18/2021 CLINICAL DATA:  A 76 year old female presents for evaluation of LEFT lower lobe squamous cell carcinoma. EXAM: CHEST - 2 VIEW COMPARISON:  July 19, 2020. FINDINGS: Post LEFT lower lobectomy. Mild scarring in the LEFT mid chest versus linear atelectasis. No sign of effusion. No lobar consolidative process. Mild shift of mediastinal structures into the LEFT chest as before. Hilar structures and mediastinal contours are unremarkable. On limited assessment there is no acute skeletal process. IMPRESSION: No acute cardiopulmonary disease. Electronically Signed   By: Zetta Bills M.D.   On: 04/18/2021 13:07    ASSESSMENT AND PLAN: This is a very pleasant 76 years old African-American female diagnosed with a stage Ib (T2 a, N0, M0) non-small cell lung cancer, squamous cell carcinoma status post  left lower lobe superior segmentectomy with lymph node dissection under the care of Dr. Roxan Hockey on March 12, 2020 with tumor size of 3.9 cm and visceropleural involvement. The patient is currently on observation and she is feeling fine with no concerning complaints. She was supposed to have repeat CT scan of the chest before this visit but unfortunately it is scheduled to be done on June 05, 2021. I recommended for the patient to keep her appointment for the scan. If the scan is fine with no concerning findings for recurrence, I will see her back for follow-up visit in 6 months with repeat CT scan of the chest. The patient was advised to call immediately if she has any other concerning symptoms in the interval. The patient voices understanding of current disease status and treatment options and is in agreement with the current care plan.  All questions were answered. The patient knows to call the clinic with any problems, questions or concerns. We can certainly see the patient much sooner  if necessary.   Disclaimer: This note was dictated with voice recognition software. Similar sounding words can inadvertently be transcribed and may not be corrected upon review.

## 2021-06-01 ENCOUNTER — Encounter: Payer: Self-pay | Admitting: *Deleted

## 2021-06-05 ENCOUNTER — Encounter: Payer: Self-pay | Admitting: Cardiology

## 2021-06-05 ENCOUNTER — Other Ambulatory Visit: Payer: Self-pay

## 2021-06-05 ENCOUNTER — Ambulatory Visit (HOSPITAL_COMMUNITY)
Admission: RE | Admit: 2021-06-05 | Discharge: 2021-06-05 | Disposition: A | Discharge status: Home / Self Care | Payer: Medicare Other | Source: Ambulatory Visit | Attending: Internal Medicine | Admitting: Internal Medicine

## 2021-06-05 DIAGNOSIS — C3490 Malignant neoplasm of unspecified part of unspecified bronchus or lung: Secondary | ICD-10-CM | POA: Diagnosis present

## 2021-06-05 MED ORDER — IOHEXOL 300 MG/ML  SOLN
75.0000 mL | Freq: Once | INTRAMUSCULAR | Status: AC | PRN
  Administered 2021-06-05: 75 mL via INTRAVENOUS

## 2021-06-05 NOTE — Progress Notes (Signed)
Cardiology Office Note  Date: 06/06/2021   ID: Yolanda Bauer 16-Jul-1945, MRN [id number]  PCP:  Denny Levy, PA  Cardiologist:  Rozann Lesches, MD Electrophysiologist:  None   Chief Complaint  Patient presents with   Hypertension    History of Present Illness: Yolanda Bauer is a 76 y.o. female referred for cardiology consultation by Ms. Worley PA-C due to abnormal BNP and hypertension based on available records. She was seen in the ER at [facility] in December 2022 with elevated blood pressure and a subconjunctival hemorrhage, lab work is noted below.  ECG described as normal, I do not have the tracing for review.  She has a standing history of hypertension, her medications have been adjusted with time.  She was switched from lisinopril to Benicar HCT by Dr. Melvyn Novas, has also been on Toprol-XL, most recently started on Aldactone by PCP.  She checks her blood pressure almost daily and reports that her trend most recently has been better.  Highest blood pressures she reports today are 150s over 80s.  She does have dyspnea on exertion, NYHA class II-III which has been chronic.  She has a prior history of lung cancer status post surgery as noted below.  She also uses oxygen at nighttime. She had a recent Oncology visit with Dr. Julien Nordmann in early January, follow-up chest CTA from January 30 is noted below.  Past Medical History:  Diagnosis Date   Anxiety    Arthritis    Essential hypertension    Hyperlipidemia    On home oxygen therapy    Evenings   Pneumonia    Squamous cell lung cancer ([place]) 03/2020   Status post left lower lobe superior segmentectomy with lymph node dissection - Dr. Roxan Hockey    Past Surgical History:  Procedure Laterality Date   BACK SURGERY     CATARACT EXTRACTION, BILATERAL Bilateral    COLONOSCOPY WITH PROPOFOL N/A 11/21/2020   Procedure: COLONOSCOPY WITH PROPOFOL;  Surgeon: Eloise Harman, DO;  [facility];  Service: Endoscopy;   Laterality: N/A;  ASA III / 11:00   HERNIA REPAIR     INTERCOSTAL NERVE BLOCK Left 04/11/2020   Procedure: INTERCOSTAL NERVE BLOCK;  Surgeon: [name], MD;  Location: [facility];  Service: Thoracic;  Laterality: Left;   NODE DISSECTION Left 04/11/2020   Procedure: NODE DISSECTION;  Surgeon: [name], MD;  Location: [facility];  Service: Thoracic;  Laterality: Left;   POLYPECTOMY  11/21/2020   Procedure: POLYPECTOMY;  Surgeon: Eloise Harman, DO;  [facility];  Service: Endoscopy;;   TUBAL LIGATION     XI ROBOTIC ASSISTED THORACOSCOPY- SEGMENTECTOMY Left 04/11/2020   Procedure: XI ROBOTIC ASSISTED THORACOSCOPY-LEFT LOWER LOBE SUPERIOR SEGMENTECTOMY;  Surgeon: [name], MD;  [facility];  Service: Thoracic;  Laterality: Left;    Current Outpatient Medications  Medication Sig Dispense Refill   acetaminophen (TYLENOL) 325 MG tablet Take 650 mg by mouth every 6 (six) hours as needed for moderate pain.     albuterol (VENTOLIN HFA) 108 (90 Base) MCG/ACT inhaler Inhale 2 puffs into the lungs every 6 (six) hours as needed for wheezing. 8 g 6   Ascorbic Acid (VITAMIN C) 500 MG tablet Take 500 mg by mouth daily.     aspirin 81 MG tablet Take 81 mg by mouth daily.     atorvastatin (LIPITOR) 40 MG tablet Take 40 mg by mouth daily.     bismuth subsalicylate (PEPTO  BISMOL) 262 MG/15ML suspension Take 30 mLs by mouth every 6 (six) hours as needed for indigestion.     Coral Calcium 1000 (390 CA) MG TABS Take 1,000 mg by mouth every other day.     fluticasone (FLONASE) 50 MCG/ACT nasal spray Place 1 spray into both nostrils daily as needed for allergies.      Fluticasone-Umeclidin-Vilant (TRELEGY ELLIPTA) 100-62.5-25 MCG/INH AEPB Inhale 1 puff into the lungs daily. 3 each 3   Glycerin-Hypromellose-PEG 400 (DRY EYE RELIEF DROPS OP) Place 1 drop into both eyes daily as needed (dry eyes).     guaiFENesin (MUCINEX) 600 MG 12 hr tablet Take 600 mg by mouth as needed.      loratadine (CLARITIN) 10 MG tablet Take 10 mg by mouth daily.     Menthol, Topical Analgesic, (ICY HOT EX) Apply 1 application topically daily as needed (muscle pain).     metoprolol succinate (TOPROL-XL) 50 MG 24 hr tablet Take 50 mg by mouth daily. Take with or immediately following a meal.     Multiple Vitamin (MULTIVITAMIN) tablet Take 1 tablet by mouth every other day.     olmesartan-hydrochlorothiazide (BENICAR HCT) 20-12.5 MG tablet Take 1 tablet by mouth daily. 90 tablet 3   spironolactone (ALDACTONE) 25 MG tablet Take 25 mg by mouth 2 (two) times daily.     No current facility-administered medications for this visit.   Allergies:  Penicillins   Social History: The patient  reports that she quit smoking about 14 months ago. Her smoking use included cigarettes. She has a 45.00 pack-year smoking history. She has never used smokeless tobacco. She reports that she does not drink alcohol and does not use drugs.   Family History: The patient's family history includes Alcoholism in her father; Dementia in her mother; Hypertension in her mother; Kidney disease in her father; Ovarian cancer in her mother.   ROS: No palpitations or syncope.  No orthopnea or PND.  Physical Exam: VS:  BP (!) 150/82    Pulse 71    Ht 5\' 1"  (1.549 m)    Wt 176 lb 6.4 oz (80 kg)    SpO2 94%    BMI 33.33 kg/m , BMI Body mass index is 33.33 kg/m.  Wt Readings from Last 3 Encounters:  06/06/21 176 lb 6.4 oz (80 kg)  05/15/21 175 lb 4.8 oz (79.5 kg)  04/18/21 178 lb (80.7 kg)    General: Patient appears comfortable at rest. HEENT: Conjunctiva and lids normal, wearing a mask. Neck: Supple, no elevated JVP or carotid bruits, no thyromegaly. Lungs: Clear to auscultation, nonlabored breathing at rest. Cardiac: Regular rate and rhythm, no S3, 2/6 basal systolic murmur, no pericardial rub. Abdomen: Soft, nontender, bowel sounds present. Extremities: No pitting edema, distal pulses 2+. Skin: Warm and  dry. Musculoskeletal: No kyphosis. Neuropsychiatric: Alert and oriented x3, affect grossly appropriate.  ECG:  An ECG dated 04/07/2020 was personally reviewed today and demonstrated:  Sinus rhythm with PACs.  Recent Labwork: 05/12/2021: ALT 14; AST 19; BUN 15; Creatinine 0.70; Hemoglobin 12.4; Platelet Count 278; Potassium 3.7; Sodium 141  December 2022: Hemoglobin 14.1, platelets 268, pro-BNP 161, potassium 3.8, BUN 14, creatinine 0.72, AST 20, ALT 16  Other Studies Reviewed Today:  Chest CTA 06/05/2021: IMPRESSION: 1. No evidence of lung cancer recurrence or metastasis in the thorax. 2. Postsurgical change in the LEFT lower lobe. 3. Centrilobular emphysema in  the upper lobes.  Assessment and Plan:  1.  Essential hypertension.  I reviewed her lab  work from [place] visit in December 2022.  Renal function and potassium were normal at that time.  Presently on Benicar HCT, Toprol-XL, and Aldactone.  She does have room for further up titration of Benicar HCTZ if necessary, will continue to track blood pressure at home and follow-up with PCP.  The minimally elevated pro-BNP from December 2022 is rather nonspecific.  Plan is to obtain an echocardiogram to assess cardiac structure and function as this may also help to guide future medical therapy adjustments.  2.  History of squamous cell lung cancer status post left lower lobe superior segmentectomy with lymph node dissection in 2021.  She continues to follow with Oncology.  Recent follow-up chest CTA showed no evidence of recurrence or metastasis.  3.  COPD with follow-up by Dr. Melvyn Novas, she uses supplemental oxygen at nighttime.  Medication Adjustments/Labs and Tests Ordered: Current medicines are reviewed at length with the patient today.  Concerns regarding medicines are outlined above.   Tests Ordered: Orders Placed This Encounter  Procedures   ECHOCARDIOGRAM COMPLETE    Medication Changes: No orders of the defined types were placed in this  encounter.   Disposition:  Follow up  test results.  Signed, Satira Sark, MD, [facility] 06/06/2021 9:29 AM    [facility] [place], [place], [place] [address] Phone: [phone]; Fax: [phone]

## 2021-06-06 ENCOUNTER — Encounter: Payer: Self-pay | Admitting: Cardiology

## 2021-06-06 ENCOUNTER — Ambulatory Visit: Payer: Commercial Managed Care - HMO | Admitting: Cardiology

## 2021-06-06 VITALS — BP 150/82 | HR 71 | Ht 61.0 in | Wt 176.4 lb

## 2021-06-06 DIAGNOSIS — I10 Essential (primary) hypertension: Secondary | ICD-10-CM

## 2021-06-06 DIAGNOSIS — R0602 Shortness of breath: Secondary | ICD-10-CM

## 2021-06-06 DIAGNOSIS — J439 Emphysema, unspecified: Secondary | ICD-10-CM | POA: Diagnosis not present

## 2021-06-06 NOTE — Patient Instructions (Addendum)

## 2021-06-22 ENCOUNTER — Ambulatory Visit (INDEPENDENT_AMBULATORY_CARE_PROVIDER_SITE_OTHER): Payer: Medicare Other

## 2021-06-22 DIAGNOSIS — R0602 Shortness of breath: Secondary | ICD-10-CM | POA: Diagnosis not present

## 2021-06-22 LAB — ECHOCARDIOGRAM COMPLETE
AR max vel: 2.48 cm2
AV Area VTI: 2.68 cm2
AV Area mean vel: 2.76 cm2
AV Mean grad: 5 mmHg
AV Peak grad: 11.8 mmHg
AV Vena cont: 0.54 cm
Ao pk vel: 1.72 m/s
Area-P 1/2: 3 cm2
Calc EF: 70.9 %
MV M vel: 4.61 m/s
MV Peak grad: 85 mmHg
MV VTI: 3.26 cm2
P 1/2 time: 513 msec
S' Lateral: 2.37 cm
Single Plane A2C EF: 72.6 %
Single Plane A4C EF: 70.6 %

## 2021-06-27 ENCOUNTER — Telehealth: Payer: Self-pay | Admitting: *Deleted

## 2021-06-27 NOTE — Telephone Encounter (Signed)
Patient informed and verbalized understanding of plan. Copy sent to PCP 

## 2021-06-27 NOTE — Telephone Encounter (Signed)
-----   Message from Satira Sark, MD sent at 06/22/2021  5:00 PM EST ----- Results reviewed.  Vigorous LVEF at 70 to 75% with normal strain measurements, also normal RV contraction and estimated RVSP.  Mitral valve is mildly calcified and thickened with mild to moderate mitral regurgitation.  At this point would recommend continued follow-up with PCP.  A follow-up echocardiogram could be considered over the next 1 to 2 years.

## 2021-07-03 ENCOUNTER — Other Ambulatory Visit: Payer: Self-pay | Admitting: Internal Medicine

## 2021-08-18 ENCOUNTER — Other Ambulatory Visit: Payer: Self-pay | Admitting: Internal Medicine

## 2021-09-11 ENCOUNTER — Ambulatory Visit: Payer: Medicare Other | Admitting: Internal Medicine

## 2021-09-11 ENCOUNTER — Encounter: Payer: Self-pay | Admitting: Internal Medicine

## 2021-09-11 DIAGNOSIS — J9612 Chronic respiratory failure with hypercapnia: Secondary | ICD-10-CM

## 2021-09-11 DIAGNOSIS — J9611 Chronic respiratory failure with hypoxia: Secondary | ICD-10-CM | POA: Diagnosis not present

## 2021-09-11 DIAGNOSIS — J449 Chronic obstructive pulmonary disease, unspecified: Secondary | ICD-10-CM | POA: Diagnosis not present

## 2021-09-11 NOTE — Patient Instructions (Signed)
Plan A = Automatic = Always=    Trelegy one click 1st thing in am - take two good drags then rinse and gargle  ? ? ?Plan B = Backup (to supplement plan A, not to replace it) ?Only use your albuterol inhaler as a rescue medication to be used if you can't catch your breath by resting or doing a relaxed purse lip breathing pattern.  ?- The less you use it, the better it will work when you need it. ?- Ok to use the inhaler up to 2 puffs  every 4 hours if you must but call for appointment if use goes up over your usual need ?- Don't leave home without it !!  (think of it like the spare tire for your car)  ? ?Work on inhaler technique:  relax and gently blow all the way out then take a nice smooth full deep breath back in, triggering the inhaler at same time you start breathing in.  Hold for up to 5 seconds if you can. Rinse and gargle with water when done.  If mouth or throat bother you at all,  try brushing teeth/gums/tongue with arm and hammer toothpaste/ make a slurry and gargle and spit out.  ? ?Please schedule a follow up visit in 6 months but call sooner if needed - first thing in AM and don't take any inhalers that am  ? ?   ?  ?

## 2021-09-11 NOTE — Progress Notes (Signed)
? ?Yolanda Bauer, female    DOB: 01/04/1946    MRN: [id number] ? ? ?Brief patient profile:  ?65  yobf quit smoking 03/2020 with hbp on ACEi with noct "wheeze" on prn albuterol helped some referred to pulmonary clinic in [place]  01/27/2020 by Dr Denny Levy PA re SPN  LLL sup segment. ? ? ? ?History of Present Illness  ?01/27/2020  Pulmonary/ 1st office eval/Yolanda Bauer  ?Chief Complaint  ?Patient presents with  ? Consult  ?  productive cough with white phlegm in the mornings  ?Dyspnea:  No steps at her home but Not limited by breathing from desired activities   ?Cough: some am's / no hemoptysis/ some "wheeze at hs  ?Sleep: bed is flat/ one pillow  ?SABA use: none ?02 none   ?rec ?Stop lisinopril and instead take olmesartan 20- 12.5 one daily  ?The key is to stop smoking completely before smoking completely stops you! ?  ? ? ?04/11/20 L superior segmentectomy [facility]):  T2, N0, stage Ib squamous cell carcinoma no adjuvant rx  ? ? ? ?07/28/2020  f/u ov/Yolanda Bauer re: s/p L sup  Segmentectomy  ?Chief Complaint  ?Patient presents with  ? Follow-up  ?  Patient states has "shortness of breath sometimes but not as much as she used to"  ? Essential hypertension ?D/c acei 01/27/2020 due to report of noct "wheeze"   ?Dyspnea: able to shop [employer] sometimes s 15 /can't name one activity  ?Cough: nasal congestion  ?Sleeping: R side down/ bed is flat/ 2 pillows  ?SABA use: saba rarely  ?02: 2 lpm  ?Covid status: vax x 3 ?Lung cancer screening: n/a  ?rec ?Plan A = Automatic = Always=    Trelegy 100 each am  ?Work on inhaler technique:   ?Plan B = Backup (to supplement plan A, not to replace it) ?Only use your albuterol inhaler as a rescue medication  ?Continue 02 is 2lpm at bedtime and then during the day keep over  90%  ?Make sure you check your oxygen saturation  at your highest level of activity  to be sure it stays over 90% ? ?  ? ? ?03/06/2021  f/u ov/[place] office/Yolanda Bauer re: gold 3 copd  maint on trelegy    ?Chief Complaint  ?Patient presents with  ? Follow-up  ?  2L O2 cont. At home. Uses trelegy inhaler and requesting refill on albuterol inhaler. Pt reports SOB has improved since last ov.   ?Dyspnea:  [employer] pushing the cart, [place] walk short of breath sats 92% s 02 per pt  ?Cough: not much at all  ?Sleeping: flat bed, 2 pillows  ?SABA use: 2-3 x per week ?02: 2lpms hs / prn daytime  ?Covid status: vax x 3  ?Rec ?To get the most out of exercise, you need to be continuously aware that you are short of breath, but never out of breath, for at least 30 minutes daily ?Make sure your 0xygen level does not drop below 90% at peak exercise  ?  ? ?  ?  ?09/11/2021  f/u ov/ office/Yolanda Bauer re: GOLD 3 copd / hypoxemic and hypercarbic maint on Trelegy   ?Chief Complaint  ?Patient presents with  ? Follow-up  ?  Using 2LO2 at night time. Feels breathing is good some days and bad some days   ?Dyspnea:  MMRC1 = can walk nl pace, flat grade, can't hurry or go uphills or steps s sob   -  only if overdoes it  ?Cough: none  ?  Sleeping: flat bed 2 pillows ?SABA use: 2-3 x per week ?02: 2lpm hs / not wearing prn daytime  ?  ?  ? ? ?No obvious day to day or daytime variability or assoc excess/ purulent sputum or mucus plugs or hemoptysis or cp or chest tightness, subjective wheeze or overt sinus or hb symptoms.  ? ?Sleeping  without nocturnal  or early am exacerbation  of respiratory  c/o's or need for noct saba. Also denies any obvious fluctuation of symptoms with weather or environmental changes or other aggravating or alleviating factors except as outlined above  ? ?No unusual exposure hx or h/o childhood pna/ asthma or knowledge of premature birth. ? ?Current Allergies, Complete Past Medical History, Past Surgical History, Family History, and Social History were reviewed in [employer] record. ? ?ROS  The following are not active complaints unless bolded ?Hoarseness, sore throat, dysphagia, dental problems,  itching, sneezing,  nasal congestion or discharge of excess mucus or purulent secretions, ear ache,   fever, chills, sweats, unintended wt loss or wt gain, classically pleuritic or exertional cp,  orthopnea pnd or arm/hand swelling  or leg swelling, presyncope, palpitations, abdominal pain, anorexia, nausea, vomiting, diarrhea  or change in bowel habits or change in bladder habits, change in stools or change in urine, dysuria, hematuria,  rash, arthralgias, visual complaints, headache, numbness, weakness or ataxia or problems with walking or coordination,  change in mood or  memory. ?      ? ?Current Meds  ?Medication Sig  ? acetaminophen (TYLENOL) 325 MG tablet Take 650 mg by mouth every 6 (six) hours as needed for moderate pain.  ? albuterol (VENTOLIN HFA) 108 (90 Base) MCG/ACT inhaler Inhale 2 puffs into the lungs every 6 (six) hours as needed for wheezing.  ? Ascorbic Acid (VITAMIN C) 500 MG tablet Take 500 mg by mouth daily.  ? aspirin 81 MG tablet Take 81 mg by mouth daily.  ? atorvastatin (LIPITOR) 40 MG tablet Take 40 mg by mouth daily.  ? bismuth subsalicylate (PEPTO BISMOL) 262 MG/15ML suspension Take 30 mLs by mouth every 6 (six) hours as needed for indigestion.  ? Coral Calcium 1000 (390 CA) MG TABS Take 1,000 mg by mouth every other day.  ? fluticasone (FLONASE) 50 MCG/ACT nasal spray Place 1 spray into both nostrils daily as needed for allergies.   ? Glycerin-Hypromellose-PEG 400 (DRY EYE RELIEF DROPS OP) Place 1 drop into both eyes daily as needed (dry eyes).  ? guaiFENesin (MUCINEX) 600 MG 12 hr tablet Take 600 mg by mouth as needed.  ? loratadine (CLARITIN) 10 MG tablet Take 10 mg by mouth daily.  ? Menthol, Topical Analgesic, (ICY HOT EX) Apply 1 application topically daily as needed (muscle pain).  ? metoprolol succinate (TOPROL-XL) 50 MG 24 hr tablet Take 50 mg by mouth daily. Take with or immediately following a meal.  ? Multiple Vitamin (MULTIVITAMIN) tablet Take 1 tablet by mouth every other  day.  ? olmesartan-hydrochlorothiazide (BENICAR HCT) 20-12.5 MG tablet TAKE 1 TABLET BY MOUTH  DAILY  ? spironolactone (ALDACTONE) 25 MG tablet Take 25 mg by mouth 2 (two) times daily.  ? TRELEGY ELLIPTA 100-62.5-25 MCG/ACT AEPB USE 1 INHALATION BY MOUTH  DAILY  ?     ? ?  ? ?Past Medical History:  ?Diagnosis Date  ? Hyperlipidemia   ? Hypertension   ?  ? ? ? ?Objective:  ?  ?09/11/2021          176  ?  03/06/2021      180   ?09/06/2020          168  ?07/28/20 164 lb (74.4 kg)  ?07/19/20 160 lb (72.6 kg)  ?05/12/20 156 lb 14.4 oz (71.2 kg)  ?01/26/2010       148 ?  ? ?Vital signs reviewed  09/11/2021  - Note at rest 02 sats  95% on RA  ? ?General appearance:    very pleasant amb bf nad ? ? HEENT : nl exam  ? ?NECK :  without JVD/Nodes/TM/ nl carotid upstrokes bilaterally ? ? ?LUNGS: no acc muscle use,  Min barrel  contour chest wall with bilateral  slightly decreased bs s audible wheeze and  without cough on insp or exp maneuvers and min  Hyperresonant  to  percussion bilaterally   ? ? ?CV:  RRR  no s3 or murmur or increase in P2, and no edema  ? ?ABD:  soft and nontender with pos end  insp Hoover's  in the supine position. No bruits or organomegaly appreciated, bowel sounds nl ? ?MS:   Nl gait/  ext warm without deformities, calf tenderness, cyanosis or clubbing ?No obvious joint restrictions  ? ?SKIN: warm and dry without lesions   ? ?NEURO:  alert, approp, nl sensorium with  no motor or cerebellar deficits apparent.  ?    ? ?  ? ?I personally reviewed images and agree with radiology impression as follows:  ? Chest CT w contrast 06/05/21 ?1. No evidence of lung cancer recurrence or metastasis in the ?thorax. ?2. Postsurgical change in the LEFT lower lobe. ?3. Centrilobular emphysema in  the upper lobes. ?  ? ?   ?Assessment  ? ? ? ? ? ?  ?  ?

## 2021-09-12 ENCOUNTER — Encounter: Payer: Self-pay | Admitting: Internal Medicine

## 2021-09-12 NOTE — Assessment & Plan Note (Signed)
As of surgery 04/11/20 02 2lpm ? 07/28/2020   Walked RA  approx   400 ft  @ mod fast pace  stopped due to  Sob/ sats 90%    ?-  09/06/2020   Walked RA  approx   400 ft  @ mod pace  stopped due to min sob with sats 90%  ?- 03/06/2021   Walked on RA x  3  lap(s) =  approx 450 @ moderate pace, stopped due to end of study, sob @ 2nd lap  with lowest 02 sats 92% ?- HC03  05/12/21   32   ? ?Continue 2lpm hs and prn daytime with goal sats > 90%  ?

## 2021-09-12 NOTE — Assessment & Plan Note (Addendum)
Quit smoking 03/15/2020 ?- PFT's  03/15/20  FEV1 0.90 (44 % ) ratio 0.54  p 3 % improvement from saba p 0 prior to study with DLCO  12.22 (84%) corrects to 3.59 (85%)  for alv volume and FV curve classic concave contour on exp portion   ?- 07/28/2020  After extensive coaching inhaler device,  effectiveness =    0 with hfa ?- 07/28/2020  After extensive coaching inhaler device,  effectiveness =    75% with elipta with short ti > try trelegy > improved 09/06/2020 > continue and f/u yearly in pulmonary clinic in [place] ? ? Group D (now reclassified as E) in terms of symptom/risk and laba/lama/ICS  therefore appropriate rx at this point >>>  Continue trelegy and approp saba ? ?F/u q 6 m sooner prn  ? ?    ?  ? ?Each maintenance medication was reviewed in detail including emphasizing most importantly the difference between maintenance and prns and under what circumstances the prns are to be triggered using an action plan format where appropriate. ? ?Total time for H and P, chart review, counseling, reviewing dpi/hfa/02 device(s) and generating customized AVS unique to this office visit / same day charting =  24 min  ?     ? ?  ?

## 2021-10-08 ENCOUNTER — Other Ambulatory Visit: Payer: Self-pay | Admitting: Internal Medicine

## 2021-10-09 ENCOUNTER — Other Ambulatory Visit: Payer: Self-pay | Admitting: Internal Medicine

## 2021-11-16 ENCOUNTER — Encounter (HOSPITAL_COMMUNITY): Payer: Self-pay

## 2021-11-16 ENCOUNTER — Other Ambulatory Visit: Payer: Self-pay | Admitting: Internal Medicine

## 2021-11-16 ENCOUNTER — Other Ambulatory Visit: Payer: Self-pay

## 2021-11-16 ENCOUNTER — Ambulatory Visit (HOSPITAL_COMMUNITY)
Admission: RE | Admit: 2021-11-16 | Discharge: 2021-11-16 | Disposition: A | Discharge status: Home / Self Care | Payer: Medicare Other | Source: Ambulatory Visit | Attending: Internal Medicine | Admitting: Internal Medicine

## 2021-11-16 ENCOUNTER — Inpatient Hospital Stay: Payer: Medicare Other | Attending: Internal Medicine

## 2021-11-16 DIAGNOSIS — C3490 Malignant neoplasm of unspecified part of unspecified bronchus or lung: Secondary | ICD-10-CM | POA: Diagnosis present

## 2021-11-16 DIAGNOSIS — Z08 Encounter for follow-up examination after completed treatment for malignant neoplasm: Secondary | ICD-10-CM | POA: Insufficient documentation

## 2021-11-16 DIAGNOSIS — Z85118 Personal history of other malignant neoplasm of bronchus and lung: Secondary | ICD-10-CM | POA: Insufficient documentation

## 2021-11-16 DIAGNOSIS — R001 Bradycardia, unspecified: Secondary | ICD-10-CM | POA: Insufficient documentation

## 2021-11-16 DIAGNOSIS — I10 Essential (primary) hypertension: Secondary | ICD-10-CM | POA: Insufficient documentation

## 2021-11-16 LAB — CBC WITH DIFFERENTIAL (CANCER CENTER ONLY)
Abs Immature Granulocytes: 0.01 10*3/uL (ref 0.00–0.07)
Basophils Absolute: 0 10*3/uL (ref 0.0–0.1)
Basophils Relative: 1 %
Eosinophils Absolute: 0.1 10*3/uL (ref 0.0–0.5)
Eosinophils Relative: 2 %
HCT: 39.3 % (ref 36.0–46.0)
Hemoglobin: 12.9 g/dL (ref 12.0–15.0)
Immature Granulocytes: 0 %
Lymphocytes Relative: 31 %
Lymphs Abs: 1.6 10*3/uL (ref 0.7–4.0)
MCH: 29.3 pg (ref 26.0–34.0)
MCHC: 32.8 g/dL (ref 30.0–36.0)
MCV: 89.3 fL (ref 80.0–100.0)
Monocytes Absolute: 0.5 10*3/uL (ref 0.1–1.0)
Monocytes Relative: 10 %
Neutro Abs: 2.8 10*3/uL (ref 1.7–7.7)
Neutrophils Relative %: 56 %
Platelet Count: 290 10*3/uL (ref 150–400)
RBC: 4.4 MIL/uL (ref 3.87–5.11)
RDW: 14.2 % (ref 11.5–15.5)
WBC Count: 5.1 10*3/uL (ref 4.0–10.5)
nRBC: 0 % (ref 0.0–0.2)

## 2021-11-16 LAB — CMP (CANCER CENTER ONLY)
ALT: 12 U/L (ref 0–44)
AST: 14 U/L — ABNORMAL LOW (ref 15–41)
Albumin: 4.1 g/dL (ref 3.5–5.0)
Alkaline Phosphatase: 69 U/L (ref 38–126)
Anion gap: 6 (ref 5–15)
BUN: 39 mg/dL — ABNORMAL HIGH (ref 8–23)
CO2: 27 mmol/L (ref 22–32)
Calcium: 9.7 mg/dL (ref 8.9–10.3)
Chloride: 106 mmol/L (ref 98–111)
Creatinine: 1.08 mg/dL — ABNORMAL HIGH (ref 0.44–1.00)
GFR, Estimated: 54 mL/min — ABNORMAL LOW (ref >60)
Glucose, Bld: 104 mg/dL — ABNORMAL HIGH (ref 70–99)
Potassium: 4.3 mmol/L (ref 3.5–5.1)
Sodium: 139 mmol/L (ref 135–145)
Total Bilirubin: 0.4 mg/dL (ref 0.3–1.2)
Total Protein: 7.2 g/dL (ref 6.5–8.1)

## 2021-11-16 MED ORDER — IOHEXOL 300 MG/ML  SOLN
75.0000 mL | Freq: Once | INTRAMUSCULAR | Status: AC | PRN
  Administered 2021-11-16: 75 mL via INTRAVENOUS

## 2021-11-20 ENCOUNTER — Other Ambulatory Visit: Payer: Self-pay

## 2021-11-20 ENCOUNTER — Telehealth: Payer: Self-pay | Admitting: Cardiology

## 2021-11-20 ENCOUNTER — Inpatient Hospital Stay: Payer: Medicare Other | Admitting: Internal Medicine

## 2021-11-20 VITALS — BP 160/20 | HR 36 | Temp 97.7°F | Resp 15 | Wt 179.4 lb

## 2021-11-20 DIAGNOSIS — Z85118 Personal history of other malignant neoplasm of bronchus and lung: Secondary | ICD-10-CM | POA: Diagnosis present

## 2021-11-20 DIAGNOSIS — C3490 Malignant neoplasm of unspecified part of unspecified bronchus or lung: Secondary | ICD-10-CM

## 2021-11-20 DIAGNOSIS — Z08 Encounter for follow-up examination after completed treatment for malignant neoplasm: Secondary | ICD-10-CM | POA: Diagnosis present

## 2021-11-20 DIAGNOSIS — I10 Essential (primary) hypertension: Secondary | ICD-10-CM | POA: Diagnosis not present

## 2021-11-20 DIAGNOSIS — R001 Bradycardia, unspecified: Secondary | ICD-10-CM | POA: Diagnosis not present

## 2021-11-20 NOTE — Telephone Encounter (Signed)
Patient had an abnormal EKG at Dr. Lew Dawes office. Dr. Earlie Server wants patient to see Dr. Domenic Polite ASAP. PCP changed two of her cardiac medications.

## 2021-11-20 NOTE — Progress Notes (Signed)
Yolanda [phone]   [phone]  OFFICE PROGRESS NOTE  Denny Levy, [place] [address]  DIAGNOSIS: Stage IB (T2 a, N0, M0) non-small cell lung cancer, squamous cell carcinoma diagnosed in November 2021.  PRIOR THERAPY: status post left lower lobe superior segmentectomy with lymph node dissection under the care of Dr. Roxan Hockey on March 12, 2020 and the final size of the tumor was 3.9 cm with visceral pleural invasion  CURRENT THERAPY: Observation.  INTERVAL HISTORY: Yolanda Bauer 76 y.o. female returns to the clinic today for 6 months follow-up visit.  The patient is feeling fine today with no concerning complaints but her blood pressure is still elevated and her heart rate is low.  She is currently on metoprolol XL and Benicar HCT by her cardiologist and primary care physician.  She denied having any current chest pain, shortness of breath, cough or hemoptysis.  She has no nausea, vomiting, diarrhea or constipation.  She has no headache or visual changes.  She has no fever or chills.  The patient had repeat CT scan of the chest performed recently and she is here for evaluation and discussion of her scan results.  MEDICAL HISTORY: Past Medical History:  Diagnosis Date   Anxiety    Arthritis    Essential hypertension    Hyperlipidemia    On home oxygen therapy    Evenings   Pneumonia    Squamous cell lung cancer ([place]) 03/2020   Status post left lower lobe superior segmentectomy with lymph node dissection - Dr. Roxan Hockey    ALLERGIES:  is allergic to penicillins.  MEDICATIONS:  Current Outpatient Medications  Medication Sig Dispense Refill   acetaminophen (TYLENOL) 325 MG tablet Take 650 mg by mouth every 6 (six) hours as needed for moderate pain.     albuterol (VENTOLIN HFA) 108 (90 Base) MCG/ACT inhaler Inhale 2 puffs into the lungs every 6 (six) hours as needed for wheezing. 8 g 6   Ascorbic Acid (VITAMIN C)  500 MG tablet Take 500 mg by mouth daily.     aspirin 81 MG tablet Take 81 mg by mouth daily.     atorvastatin (LIPITOR) 40 MG tablet Take 40 mg by mouth daily.     bismuth subsalicylate (PEPTO BISMOL) 262 MG/15ML suspension Take 30 mLs by mouth every 6 (six) hours as needed for indigestion.     Coral Calcium 1000 (390 CA) MG TABS Take 1,000 mg by mouth every other day.     fluticasone (FLONASE) 50 MCG/ACT nasal spray Place 1 spray into both nostrils daily as needed for allergies.      Glycerin-Hypromellose-PEG 400 (DRY EYE RELIEF DROPS OP) Place 1 drop into both eyes daily as needed (dry eyes).     guaiFENesin (MUCINEX) 600 MG 12 hr tablet Take 600 mg by mouth as needed.     loratadine (CLARITIN) 10 MG tablet Take 10 mg by mouth daily.     Menthol, Topical Analgesic, (ICY HOT EX) Apply 1 application topically daily as needed (muscle pain).     metoprolol succinate (TOPROL-XL) 50 MG 24 hr tablet Take 50 mg by mouth daily. Take with or immediately following a meal.     Multiple Vitamin (MULTIVITAMIN) tablet Take 1 tablet by mouth every other day.     olmesartan-hydrochlorothiazide (BENICAR HCT) 20-12.5 MG tablet TAKE 1 TABLET BY MOUTH DAILY 90 tablet 3   spironolactone (ALDACTONE) 25 MG tablet Take 25 mg  by mouth 2 (two) times daily.     TRELEGY ELLIPTA 100-62.5-25 MCG/ACT AEPB USE 1 INHALATION BY MOUTH  DAILY 180 each 3   No current facility-administered medications for this visit.    SURGICAL HISTORY:  Past Surgical History:  Procedure Laterality Date   BACK SURGERY     CATARACT EXTRACTION, BILATERAL Bilateral    COLONOSCOPY WITH PROPOFOL N/A 11/21/2020   Procedure: COLONOSCOPY WITH PROPOFOL;  Surgeon: Eloise Harman, DO;  [facility];  Service: Endoscopy;  Laterality: N/A;  ASA III / 11:00   HERNIA REPAIR     INTERCOSTAL NERVE BLOCK Left 04/11/2020   Procedure: INTERCOSTAL NERVE BLOCK;  Surgeon: [name], MD;  Location: [facility];  Service: Thoracic;  Laterality:  Left;   NODE DISSECTION Left 04/11/2020   Procedure: NODE DISSECTION;  Surgeon: [name], MD;  Location: [facility];  Service: Thoracic;  Laterality: Left;   POLYPECTOMY  11/21/2020   Procedure: POLYPECTOMY;  Surgeon: Eloise Harman, DO;  [facility];  Service: Endoscopy;;   TUBAL LIGATION     XI ROBOTIC ASSISTED THORACOSCOPY- SEGMENTECTOMY Left 04/11/2020   Procedure: XI ROBOTIC ASSISTED THORACOSCOPY-LEFT LOWER LOBE SUPERIOR SEGMENTECTOMY;  Surgeon: [name], MD;  Location: [facility];  Service: Thoracic;  Laterality: Left;    REVIEW OF SYSTEMS:  Constitutional: positive for fatigue Eyes: negative Ears, nose, mouth, throat, and face: negative Respiratory: negative Cardiovascular: negative Gastrointestinal: negative Genitourinary:negative Integument/breast: negative Hematologic/lymphatic: negative Musculoskeletal:negative Neurological: negative Behavioral/Psych: negative Endocrine: negative Allergic/Immunologic: negative   PHYSICAL EXAMINATION: General appearance: alert, cooperative, and no distress Head: Normocephalic, without obvious abnormality, atraumatic Neck: no adenopathy, no JVD, supple, symmetrical, trachea midline, and thyroid not enlarged, symmetric, no tenderness/mass/nodules Lymph nodes: Cervical, supraclavicular, and axillary nodes normal. Resp: clear to auscultation bilaterally Back: symmetric, no curvature. ROM normal. No CVA tenderness. Cardio: Bradycardic GI: soft, non-tender; bowel sounds normal; no masses,  no organomegaly Extremities: extremities normal, atraumatic, no cyanosis or edema Neurologic: Alert and oriented X 3, normal strength and tone. Normal symmetric reflexes. Normal coordination and gait  ECOG PERFORMANCE STATUS: 1 - Symptomatic but completely ambulatory  Blood pressure (!) 160/20, pulse (!) 36, temperature 97.7 F (36.5 C), temperature source Oral, resp. rate 15, weight 179 lb 6.4 oz (81.4 kg), SpO2 100  %.  LABORATORY DATA: Lab Results  Component Value Date   WBC 5.1 11/16/2021   HGB 12.9 11/16/2021   HCT 39.3 11/16/2021   MCV 89.3 11/16/2021   PLT 290 11/16/2021      Chemistry      Component Value Date/Time   NA 139 11/16/2021 1039   K 4.3 11/16/2021 1039   CL 106 11/16/2021 1039   CO2 27 11/16/2021 1039   BUN 39 (H) 11/16/2021 1039   CREATININE 1.08 (H) 11/16/2021 1039      Component Value Date/Time   CALCIUM 9.7 11/16/2021 1039   ALKPHOS 69 11/16/2021 1039   AST 14 (L) 11/16/2021 1039   ALT 12 11/16/2021 1039   BILITOT 0.4 11/16/2021 1039       RADIOGRAPHIC STUDIES: CT Chest W Contrast  Result Date: 11/17/2021 CLINICAL DATA:  History of lung cancer. Follow-up exam. * Tracking Code: BO * EXAM: CT CHEST WITH CONTRAST TECHNIQUE: Multidetector CT imaging of the chest was performed during intravenous contrast administration. RADIATION DOSE REDUCTION: This exam was performed according to the departmental dose-optimization program which includes automated exposure control, adjustment of the mA and/or kV according to patient size and/or use of  iterative reconstruction technique. CONTRAST:  28mL OMNIPAQUE IOHEXOL 300 MG/ML  SOLN COMPARISON:  CT chest 06/05/2021 FINDINGS: Cardiovascular: Normal heart size. Thoracic aortic vascular calcifications. Mediastinum/Nodes: No enlarged axillary, mediastinal or hilar lymphadenopathy. Normal appearance of the esophagus. Lungs/Pleura: Central airways are patent. Subpleural scarring within the lower lobes bilaterally. Centrilobular and paraseptal emphysematous change. Biapical pleuroparenchymal thickening/scarring. Stable postsurgical changes superior segment left lower lobe. No suspicious pulmonary nodules. No pleural effusion or pneumothorax. Upper Abdomen: Unremarkable. Musculoskeletal: Thoracic spine degenerative changes. No aggressive or acute appearing osseous lesions. IMPRESSION: No evidence to suggest recurrence or metastatic disease within  the chest. Stable postsurgical changes left hemithorax. Aortic Atherosclerosis (ICD10-I70.0) and Emphysema (ICD10-J43.9). Electronically Signed   By: Lovey Newcomer M.D.   On: 11/17/2021 15:30    ASSESSMENT AND PLAN: This is a very pleasant 76 years old African-American female diagnosed with a stage Ib (T2 a, N0, M0) non-small cell lung cancer, squamous cell carcinoma status post left lower lobe superior segmentectomy with lymph node dissection under the care of Dr. Roxan Hockey on March 12, 2020 with tumor size of 3.9 cm and visceropleural involvement. The patient is currently on observation and she is feeling fine with no concerning complaints but she continues to have bradycardia and hypertension. She had repeat CT scan of the chest performed recently.  I personally and independently reviewed the scans and discussed the results with the patient today. Her scan showed no concerning findings for disease recurrence or progression. I recommended her to continue on observation with repeat CT scan of the chest in 1 year. Regarding the hypertension and bradycardia, we will repeat her vitals again and if it still persisted, we will arrange for the patient to have an EKG and reach out to her cardiologist Dr. Rozann Lesches in [place] with [facility] and primary care physician for further recommendation. The patient was advised to call immediately if she has any other concerning symptoms in the interval. The patient voices understanding of current disease status and treatment options and is in agreement with the current care plan.  All questions were answered. The patient knows to call the clinic with any problems, questions or concerns. We can certainly see the patient much sooner if necessary.   Disclaimer: This note was dictated with voice recognition software. Similar sounding words can inadvertently be transcribed and may not be corrected upon review.

## 2021-11-20 NOTE — Progress Notes (Signed)
Called Dr Inocente Salles McDowell's office with abnormal EKG and recent cardiac med changes .

## 2021-11-23 ENCOUNTER — Telehealth: Payer: Self-pay

## 2021-11-23 NOTE — Telephone Encounter (Signed)
I spoke with patient after her visit with her pcp, Denny Levy, PA-C at [facility].  I spoke with Denny Levy, PA-C, and she states her HR was 39 but attributed that to her taking the metoprolol.She had not seen any recent EKG on patient. Her BP today was 149/52. Ms.Worley states patient denied any CP,dizziness, or syncope.   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 I again discussed with her the need for her to go to the ED immediately if she has any more symptoms of SOB.    I will notify Dr.McDowell.

## 2021-11-23 NOTE — Telephone Encounter (Signed)
While doing chart prep for patients 11/27/21 appointment, I noted she had an EKG done on 11/20/21 which noted HR of 38 with Third degree heart block .   I conferred with B.Strader,PA-C and called patient . I asked her to Stop Metoprolol immediately. She denies any syncope but reports she cannot walk far without having to stop and rest.She has a pcp appointment within the next hour. I advised her to go directly to the ED if she experiences pre-syncope. She agrees.

## 2021-11-23 NOTE — Telephone Encounter (Signed)
I was contacted today by Ms. Strader PA-C regarding patient's ECG and a scheduled office visit for July 24.  I reviewed the chart.  I last saw Yolanda Bauer in January at which point her ECG was normal.  She saw Yolanda Bauer on July 17 and an ECG was obtained due to bradycardia with heart rate in the 30s.  Our office was contacted about arranging a follow-up visit, however I was never shown this tracing or made aware of the situation until today.  I was not contacted personally by Yolanda Bauer.  Today, I reviewed the ECG that was obtained on July 17 and discussed these results with Ms. Ahmed Prima PA-C.  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  Medications also reviewed, she has been on Toprol-XL 50 mg daily for treatment of hypertension, also Aldactone and Benicar HCT.  Recent lab work showed potassium 4.3, BUN 39, and creatinine 1.08.  I agree with recommendation to stop Toprol-XL completely.  I have asked nursing to contact the patient to assess for symptomatology.  I would not wait for office outpatient evaluation if she has any symptoms associated with her heart block to include chest pain, shortness of breath, dizziness, or syncope.  It would be better in that case for her to be evaluated in the ER.  The main question will be whether she ultimately needs a pacemaker if her heart rhythm does not improve off Toprol-XL.  Yolanda Bauer, M.D., F.A.C.C.

## 2021-11-23 NOTE — Telephone Encounter (Signed)
Dr.McDowell's message noted and patient verbalized instructions.

## 2021-11-27 ENCOUNTER — Observation Stay (HOSPITAL_COMMUNITY)
Admission: AD | Admit: 2021-11-27 | Discharge: 2021-11-29 | Disposition: A | Discharge status: Home / Self Care | Payer: Medicare Other | Source: Ambulatory Visit | Attending: Cardiology | Admitting: Cardiology

## 2021-11-27 ENCOUNTER — Observation Stay (HOSPITAL_BASED_OUTPATIENT_CLINIC_OR_DEPARTMENT_OTHER): Payer: Medicare Other

## 2021-11-27 ENCOUNTER — Ambulatory Visit: Payer: Medicare Other | Admitting: Physician Assistant

## 2021-11-27 ENCOUNTER — Encounter: Payer: Self-pay | Admitting: Physician Assistant

## 2021-11-27 VITALS — BP 140/58 | HR 41 | Ht 61.0 in | Wt 181.4 lb

## 2021-11-27 DIAGNOSIS — Z79899 Other long term (current) drug therapy: Secondary | ICD-10-CM | POA: Insufficient documentation

## 2021-11-27 DIAGNOSIS — I10 Essential (primary) hypertension: Secondary | ICD-10-CM

## 2021-11-27 DIAGNOSIS — C3432 Malignant neoplasm of lower lobe, left bronchus or lung: Secondary | ICD-10-CM | POA: Diagnosis not present

## 2021-11-27 DIAGNOSIS — Z85118 Personal history of other malignant neoplasm of bronchus and lung: Secondary | ICD-10-CM | POA: Diagnosis not present

## 2021-11-27 DIAGNOSIS — I110 Hypertensive heart disease with heart failure: Secondary | ICD-10-CM | POA: Diagnosis not present

## 2021-11-27 DIAGNOSIS — I442 Atrioventricular block, complete: Secondary | ICD-10-CM

## 2021-11-27 DIAGNOSIS — R001 Bradycardia, unspecified: Secondary | ICD-10-CM | POA: Insufficient documentation

## 2021-11-27 DIAGNOSIS — J449 Chronic obstructive pulmonary disease, unspecified: Secondary | ICD-10-CM

## 2021-11-27 DIAGNOSIS — Z7982 Long term (current) use of aspirin: Secondary | ICD-10-CM | POA: Insufficient documentation

## 2021-11-27 DIAGNOSIS — Z87891 Personal history of nicotine dependence: Secondary | ICD-10-CM | POA: Insufficient documentation

## 2021-11-27 DIAGNOSIS — R0609 Other forms of dyspnea: Secondary | ICD-10-CM

## 2021-11-27 DIAGNOSIS — I5030 Unspecified diastolic (congestive) heart failure: Secondary | ICD-10-CM | POA: Insufficient documentation

## 2021-11-27 LAB — ECHOCARDIOGRAM COMPLETE
Height: 61 in
MV VTI: 0.33 cm2
P 1/2 time: 658 msec
S' Lateral: 2.7 cm
Weight: 2902.4 oz

## 2021-11-27 LAB — BRAIN NATRIURETIC PEPTIDE: B Natriuretic Peptide: 658.7 pg/mL — ABNORMAL HIGH (ref 0.0–100.0)

## 2021-11-27 LAB — CBC WITH DIFFERENTIAL/PLATELET
Abs Immature Granulocytes: 0.02 10*3/uL (ref 0.00–0.07)
Basophils Absolute: 0 10*3/uL (ref 0.0–0.1)
Basophils Relative: 0 %
Eosinophils Absolute: 0.1 10*3/uL (ref 0.0–0.5)
Eosinophils Relative: 1 %
HCT: 37.4 % (ref 36.0–46.0)
Hemoglobin: 12.3 g/dL (ref 12.0–15.0)
Immature Granulocytes: 0 %
Lymphocytes Relative: 21 %
Lymphs Abs: 1.5 10*3/uL (ref 0.7–4.0)
MCH: 29.4 pg (ref 26.0–34.0)
MCHC: 32.9 g/dL (ref 30.0–36.0)
MCV: 89.3 fL (ref 80.0–100.0)
Monocytes Absolute: 0.6 10*3/uL (ref 0.1–1.0)
Monocytes Relative: 9 %
Neutro Abs: 4.8 10*3/uL (ref 1.7–7.7)
Neutrophils Relative %: 69 %
Platelets: 270 10*3/uL (ref 150–400)
RBC: 4.19 MIL/uL (ref 3.87–5.11)
RDW: 15.1 % (ref 11.5–15.5)
WBC: 7 10*3/uL (ref 4.0–10.5)
nRBC: 0 % (ref 0.0–0.2)

## 2021-11-27 LAB — PROTIME-INR
INR: 1.1 (ref 0.8–1.2)
Prothrombin Time: 14.3 seconds (ref 11.4–15.2)

## 2021-11-27 LAB — COMPREHENSIVE METABOLIC PANEL
ALT: 94 U/L — ABNORMAL HIGH (ref 0–44)
AST: 53 U/L — ABNORMAL HIGH (ref 15–41)
Albumin: 3.9 g/dL (ref 3.5–5.0)
Alkaline Phosphatase: 77 U/L (ref 38–126)
Anion gap: 12 (ref 5–15)
BUN: 34 mg/dL — ABNORMAL HIGH (ref 8–23)
CO2: 24 mmol/L (ref 22–32)
Calcium: 9.5 mg/dL (ref 8.9–10.3)
Chloride: 106 mmol/L (ref 98–111)
Creatinine, Ser: 1.29 mg/dL — ABNORMAL HIGH (ref 0.44–1.00)
GFR, Estimated: 44 mL/min — ABNORMAL LOW (ref >60)
Glucose, Bld: 102 mg/dL — ABNORMAL HIGH (ref 70–99)
Potassium: 4.3 mmol/L (ref 3.5–5.1)
Sodium: 142 mmol/L (ref 135–145)
Total Bilirubin: 0.4 mg/dL (ref 0.3–1.2)
Total Protein: 6.8 g/dL (ref 6.5–8.1)

## 2021-11-27 LAB — TSH: TSH: 0.826 u[IU]/mL (ref 0.350–4.500)

## 2021-11-27 LAB — APTT: aPTT: 25 seconds (ref 24–36)

## 2021-11-27 LAB — MAGNESIUM: Magnesium: 2.1 mg/dL (ref 1.7–2.4)

## 2021-11-27 MED ORDER — SODIUM CHLORIDE 0.9% FLUSH: 3.0000 mL | INTRAVENOUS | Status: DC | PRN

## 2021-11-27 MED ORDER — OLMESARTAN MEDOXOMIL-HCTZ 40-12.5 MG PO TABS: 1.0000 | ORAL_TABLET | Freq: Every day | ORAL | Status: DC

## 2021-11-27 MED ORDER — ACETAMINOPHEN 325 MG PO TABS: 650.0000 mg | ORAL_TABLET | Freq: Four times a day (QID) | ORAL | Status: DC | PRN

## 2021-11-27 MED ORDER — SPIRONOLACTONE 25 MG PO TABS
25.0000 mg | ORAL_TABLET | Freq: Two times a day (BID) | ORAL | Status: DC
  Administered 2021-11-27 – 2021-11-29 (×4): 25 mg via ORAL
  Filled 2021-11-27 (×4): qty 1

## 2021-11-27 MED ORDER — HYDROCHLOROTHIAZIDE 12.5 MG PO TABS
12.5000 mg | ORAL_TABLET | Freq: Every day | ORAL | Status: DC
  Administered 2021-11-28 – 2021-11-29 (×2): 12.5 mg via ORAL
  Filled 2021-11-27 (×2): qty 1

## 2021-11-27 MED ORDER — ALBUTEROL SULFATE (2.5 MG/3ML) 0.083% IN NEBU: 2.5000 mg | INHALATION_SOLUTION | Freq: Four times a day (QID) | RESPIRATORY_TRACT | Status: DC | PRN

## 2021-11-27 MED ORDER — ACETAMINOPHEN 325 MG PO TABS: 650.0000 mg | ORAL_TABLET | ORAL | Status: DC | PRN

## 2021-11-27 MED ORDER — SODIUM CHLORIDE 0.9% FLUSH
3.0000 mL | Freq: Two times a day (BID) | INTRAVENOUS | Status: DC
  Administered 2021-11-27 – 2021-11-29 (×4): 3 mL via INTRAVENOUS

## 2021-11-27 MED ORDER — ASPIRIN 81 MG PO TBEC
81.0000 mg | DELAYED_RELEASE_TABLET | Freq: Every day | ORAL | Status: DC
  Administered 2021-11-28 – 2021-11-29 (×2): 81 mg via ORAL
  Filled 2021-11-27 (×2): qty 1

## 2021-11-27 MED ORDER — IRBESARTAN 300 MG PO TABS
300.0000 mg | ORAL_TABLET | Freq: Every day | ORAL | Status: DC
  Administered 2021-11-28 – 2021-11-29 (×2): 300 mg via ORAL
  Filled 2021-11-27 (×2): qty 1

## 2021-11-27 MED ORDER — ONDANSETRON HCL 4 MG/2ML IJ SOLN: 4.0000 mg | Freq: Four times a day (QID) | INTRAMUSCULAR | Status: DC | PRN

## 2021-11-27 MED ORDER — SODIUM CHLORIDE 0.9 % IV SOLN: 4.0000 mg | Freq: Four times a day (QID) | INTRAVENOUS | Status: DC | PRN

## 2021-11-27 MED ORDER — SODIUM CHLORIDE 0.9 % IV SOLN: 250.0000 mL | INTRAVENOUS | Status: DC | PRN

## 2021-11-27 NOTE — Plan of Care (Signed)

## 2021-11-27 NOTE — Progress Notes (Addendum)
Cardiology Office Note    Date:  11/27/2021   ID:  Yolanda Bauer, Yolanda Bauer [birth year], MRN [id number]   PCP:  Denny Levy, [place]  Cardiologist:  Rozann Lesches, MD   Advanced Practice Provider:  No care team member to display Electrophysiologist:  None   [id number]   Chief Complaint  Patient presents with   Shortness of Breath    History of Present Illness:  Yolanda Bauer is a 76 y.o. female with history of lung CA status post surgery on O2, longstanding hypertension treated with Toprol, Benicar HCT and Aldactone in the past.  Patient saw Dr. Domenic Polite 06/06/2021 because of elevated blood pressures subconjunctival hemorrhage and mildly elevated BNP.  2D echo ordered and showed vigorous LVEF 70 to 75% with normal strain measurements mild to moderate MR.  No changes made.  Patient was seen in the oncology office 11/20/2021 with a heart rate of 39 and EKG showed complete heart block.  Dr. Domenic Polite told them to stop her Toprol and let it wash out and come in today for follow-up.  Patient complains of dyspnea on exertion and fatigue for 2 weeks.  Her PCP increased her benicar HCT and decreased metoprolol from 50 mg bid to 25 mg bid 10/23/21. She had some heart racing with exertion. Patient has occasional dizziness but no presyncope. She's been off metoprolol since last Thur evening.       Past Medical History:  Diagnosis Date   Anxiety    Arthritis    Essential hypertension    Hyperlipidemia    On home oxygen therapy    Evenings   Pneumonia    Squamous cell lung cancer ([place]) 03/2020   Status post left lower lobe superior segmentectomy with lymph node dissection - Dr. Roxan Hockey    Past Surgical History:  Procedure Laterality Date   BACK SURGERY     CATARACT EXTRACTION, BILATERAL Bilateral    COLONOSCOPY WITH PROPOFOL N/A 11/21/2020   Procedure: COLONOSCOPY WITH PROPOFOL;  Surgeon: Eloise Harman, DO;  [facility];   Service: Endoscopy;  Laterality: N/A;  ASA III / 11:00   HERNIA REPAIR     INTERCOSTAL NERVE BLOCK Left 04/11/2020   Procedure: INTERCOSTAL NERVE BLOCK;  Surgeon: [name], MD;  Location: [facility];  Service: Thoracic;  Laterality: Left;   NODE DISSECTION Left 04/11/2020   Procedure: NODE DISSECTION;  Surgeon: [name], MD;  Location: [facility];  Service: Thoracic;  Laterality: Left;   POLYPECTOMY  11/21/2020   Procedure: POLYPECTOMY;  Surgeon: Eloise Harman, DO;  [facility];  Service: Endoscopy;;   TUBAL LIGATION     XI ROBOTIC ASSISTED THORACOSCOPY- SEGMENTECTOMY Left 04/11/2020   Procedure: XI ROBOTIC ASSISTED THORACOSCOPY-LEFT LOWER LOBE SUPERIOR SEGMENTECTOMY;  Surgeon: [name], MD;  [facility];  Service: Thoracic;  Laterality: Left;    Current Medications: Current Meds  Medication Sig   acetaminophen (TYLENOL) 325 MG tablet Take 650 mg by mouth every 6 (six) hours as needed for moderate pain.   albuterol (VENTOLIN HFA) 108 (90 Base) MCG/ACT inhaler Inhale 2 puffs into the lungs every 6 (six) hours as needed for wheezing.   Ascorbic Acid (VITAMIN C) 500 MG tablet Take 500 mg by mouth daily.   aspirin 81 MG tablet Take 81 mg by mouth daily.   bismuth subsalicylate (PEPTO BISMOL) 262 MG/15ML suspension Take 30 mLs by mouth every 6 (six) hours as needed for  indigestion.   Coral Calcium 1000 (390 CA) MG TABS Take 1,000 mg by mouth every other day.   fluticasone (FLONASE) 50 MCG/ACT nasal spray Place 1 spray into both nostrils daily as needed for allergies.    Glycerin-Hypromellose-PEG 400 (DRY EYE RELIEF DROPS OP) Place 1 drop into both eyes daily as needed (dry eyes).   guaiFENesin (MUCINEX) 600 MG 12 hr tablet Take 600 mg by mouth as needed.   loratadine (CLARITIN) 10 MG tablet Take 10 mg by mouth daily.   Menthol, Topical Analgesic, (ICY HOT EX) Apply 1 application topically daily as needed (muscle pain).   Multiple Vitamin  (MULTIVITAMIN) tablet Take 1 tablet by mouth every other day.   olmesartan-hydrochlorothiazide (BENICAR HCT) 40-12.5 MG tablet Take 1 tablet by mouth daily.   spironolactone (ALDACTONE) 25 MG tablet Take 25 mg by mouth 2 (two) times daily.   TRELEGY ELLIPTA 100-62.5-25 MCG/ACT AEPB USE 1 INHALATION BY MOUTH  DAILY     Allergies:   Penicillins   Social History   Socioeconomic History   Marital status: Divorced    Spouse name: Not on file   Number of children: Not on file   Years of education: Not on file   Highest education level: Not on file  Occupational History   Not on file  Tobacco Use   Smoking status: Former    Packs/day: 1.00    Years: 45.00    Total pack years: 45.00    Types: Cigarettes    Quit date: 03/08/2020    Years since quitting: 1.7   Smokeless tobacco: Never  Vaping Use   Vaping Use: Never used  Substance and Sexual Activity   Alcohol use: Never   Drug use: Never   Sexual activity: Not Currently  Other Topics Concern   Not on file  Social History Narrative   Not on file   Social Determinants of Health   Financial Resource Strain: Not on file  Food Insecurity: Not on file  Transportation Needs: Not on file  Physical Activity: Not on file  Stress: Not on file  Social Connections: Not on file     Family History:  The patient's  family history includes Alcoholism in her father; Dementia in her mother; Hypertension in her mother; Kidney disease in her father; Ovarian cancer in her mother.   ROS:   Please see the history of present illness.    ROS All other systems reviewed and are negative.   PHYSICAL EXAM:   VS:  BP (!) 140/58   Pulse (!) 41   Ht 5\' 1"  (1.549 m)   Wt 181 lb 6.4 oz (82.3 kg)   SpO2 96%   BMI 34.28 kg/m   Physical Exam  GEN: Well nourished, well developed, in no acute distress  Neck: no JVD, carotid bruits, or masses Cardiac:RRR; no murmurs, rubs, or gallops  Respiratory:  clear to auscultation bilaterally, normal work of  breathing GI: soft, nontender, nondistended, + BS Ext: without cyanosis, clubbing, or edema, Good distal pulses bilaterally Neuro:  Alert and Oriented x 3, Psych: euthymic mood, full affect  Wt Readings from Last 3 Encounters:  11/27/21 181 lb 6.4 oz (82.3 kg)  11/20/21 179 lb 6.4 oz (81.4 kg)  09/11/21 176 lb 12.8 oz (80.2 kg)      Studies/Labs Reviewed:   EKG:  EKG is  ordered today.  The ekg ordered today demonstrates CHB 39/m  Recent Labs: 11/16/2021: ALT 12; BUN 39; Creatinine 1.08; Hemoglobin 12.9; Platelet Count 290;  Potassium 4.3; Sodium 139   Lipid Panel No results found for: "CHOL", "TRIG", "HDL", "CHOLHDL", "VLDL", "LDLCALC", "LDLDIRECT"  Additional studies/ records that were reviewed today include:   2D echo 06/22/2021 IMPRESSIONS     1. Left ventricular ejection fraction, by estimation, is 70 to 75%. The  left ventricle has hyperdynamic function. The left ventricle has no  regional wall motion abnormalities. Left ventricular diastolic parameters  are indeterminate. Global longitudinal  strain is normal range at -23.1%.   2. Right ventricular systolic function is normal. The right ventricular  size is normal. There is normal pulmonary artery systolic pressure. The  estimated right ventricular systolic pressure is 35.0 mmHg.   3. The mitral valve is abnormal, mildly thickened and calcified. Mild to  moderate mitral valve regurgitation.   4. The aortic valve is tricuspid. Aortic valve regurgitation is mild.   5. The inferior vena cava is normal in size with greater than 50%  respiratory variability, suggesting right atrial pressure of 3 mmHg.   Comparison(s): No prior Echocardiogram.   IMPRESSIONS     1. Left ventricular ejection fraction, by estimation, is 70 to 75%. The  left ventricle has hyperdynamic function. The left ventricle has no  regional wall motion abnormalities. Left ventricular diastolic parameters  are indeterminate. Global longitudinal   strain is normal range at -23.1%.   2. Right ventricular systolic function is normal. The right ventricular  size is normal. There is normal pulmonary artery systolic pressure. The  estimated right ventricular systolic pressure is 09.3 mmHg.   3. The mitral valve is abnormal, mildly thickened and calcified. Mild to  moderate mitral valve regurgitation.   4. The aortic valve is tricuspid. Aortic valve regurgitation is mild.   5. The inferior vena cava is normal in size with greater than 50%  respiratory variability, suggesting right atrial pressure of 3 mmHg.   Comparison(s): No prior Echocardiogram.   Chest CTA 06/05/2021: IMPRESSION: 1. No evidence of lung cancer recurrence or metastasis in the thorax. 2. Postsurgical change in the LEFT lower lobe. 3. Centrilobular emphysema in  the upper lobes  Risk Assessment/Calculations:         ASSESSMENT:    1. CHB (complete heart block) ([place])   2. Essential hypertension   3. 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 COPD GOLD III        PLAN:  In order of problems listed above:  Complete heart block with heart rates of 39 11/20/2021 was on Toprol and this was stopped. Last dose Thurs evening. Still in CHB HR 39/m. Having DOE with little activity and some heart racing, occasional dizziness.Discussed with Dr. Domenic Polite. Will arrange for direct admit to [facility] and have EPS see for pacemaker. Check echo.  Hypertension-stable today but likely need another agent off metoprolol  History of  squamous cell lung cancer status post left lower lobe superior segmentectomy with lymph node dissection 2021 followed by oncology  COPD  Shared Decision Making/Informed Consent        Medication Adjustments/Labs and Tests Ordered: Current medicines are reviewed at length with the patient today.  Concerns regarding medicines are outlined above.  Medication changes, Labs and Tests ordered today are listed in the Patient  Instructions below. There are no Patient Instructions on file for this visit.   Signed, Ermalinda Barrios, PA-C  11/27/2021 2:12 PM    [facility] [address], [place], [place]  [address] Phone: [phone]; Fax: [phone]    

## 2021-11-27 NOTE — Progress Notes (Signed)
  Echocardiogram 2D Echocardiogram has been performed.  Yolanda Bauer 11/27/2021, 5:52 PM

## 2021-11-27 NOTE — Progress Notes (Signed)
Pt notified that bed is ready and she can head to [facility] via private vehicle when her family arrives. Pt voiced understanding.

## 2021-11-27 NOTE — H&P (Cosign Needed)
Cardiology Office Note     Date:  11/27/2021    ID:  Mame, Twombly [birth year], MRN [id number]     PCP:  Denny Levy, [place]  Cardiologist:  Rozann Lesches, MD   Advanced Practice Provider:  No care team member to display Electrophysiologist:  None    [id number]}       Chief Complaint  Patient presents with   Shortness of Breath      History of Present Illness:  Yolanda Bauer is a 76 y.o. female with history of lung CA status post surgery on O2, longstanding hypertension treated with Toprol, Benicar HCT and Aldactone in the past.  Patient saw Dr. Domenic Polite 06/06/2021 because of elevated blood pressures subconjunctival hemorrhage and mildly elevated BNP.  2D echo ordered and showed vigorous LVEF 70 to 75% with normal strain measurements mild to moderate MR.  No changes made.   Patient was seen in the oncology office 11/20/2021 with a heart rate of 39 and EKG showed complete heart block.  Dr. Domenic Polite told them to stop her Toprol and let it wash out and come in today for follow-up.   Patient complains of dyspnea on exertion and fatigue for 2 weeks.  Her PCP increased her benicar HCT and decreased metoprolol from 50 mg bid to 25 mg bid 10/23/21. She had some heart racing with exertion. Patient has occasional dizziness but no presyncope. She's been off metoprolol since last Thur evening.                Past Medical History:  Diagnosis Date   Anxiety     Arthritis     Essential hypertension     Hyperlipidemia     On home oxygen therapy      Evenings   Pneumonia     Squamous cell lung cancer ([facility] [employer]) 03/2020    Status post left lower lobe superior segmentectomy with lymph node dissection - Dr. Roxan Hockey           Past Surgical History:  Procedure Laterality Date   BACK SURGERY       CATARACT EXTRACTION, BILATERAL Bilateral     COLONOSCOPY WITH PROPOFOL N/A 11/21/2020    Procedure: COLONOSCOPY WITH PROPOFOL;  Surgeon:  Eloise Harman, DO;  [facility];  Service: Endoscopy;  Laterality: N/A;  ASA III / 11:00   HERNIA REPAIR       INTERCOSTAL NERVE BLOCK Left 04/11/2020    Procedure: INTERCOSTAL NERVE BLOCK;  Surgeon: [name], MD;  Location: [facility];  Service: Thoracic;  Laterality: Left;   NODE DISSECTION Left 04/11/2020    Procedure: NODE DISSECTION;  Surgeon: [name], MD;  Location: [place];  Service: Thoracic;  Laterality: Left;   POLYPECTOMY   11/21/2020    Procedure: POLYPECTOMY;  Surgeon: Eloise Harman, DO;  [facility];  Service: Endoscopy;;   TUBAL LIGATION       XI ROBOTIC ASSISTED THORACOSCOPY- SEGMENTECTOMY Left 04/11/2020    Procedure: XI ROBOTIC ASSISTED THORACOSCOPY-LEFT LOWER LOBE SUPERIOR SEGMENTECTOMY;  Surgeon: [name], MD;  Location: [place];  Service: Thoracic;  Laterality: Left;      Current Medications: Active Medications      Current Meds  Medication Sig   acetaminophen (TYLENOL) 325 MG tablet Take 650 mg by mouth every 6 (six) hours as needed for moderate pain.  albuterol (VENTOLIN HFA) 108 (90 Base) MCG/ACT inhaler Inhale 2 puffs into the lungs every 6 (six) hours as needed for wheezing.   Ascorbic Acid (VITAMIN C) 500 MG tablet Take 500 mg by mouth daily.   aspirin 81 MG tablet Take 81 mg by mouth daily.   bismuth subsalicylate (PEPTO BISMOL) 262 MG/15ML suspension Take 30 mLs by mouth every 6 (six) hours as needed for indigestion.   Coral Calcium 1000 (390 CA) MG TABS Take 1,000 mg by mouth every other day.   fluticasone (FLONASE) 50 MCG/ACT nasal spray Place 1 spray into both nostrils daily as needed for allergies.    Glycerin-Hypromellose-PEG 400 (DRY EYE RELIEF DROPS OP) Place 1 drop into both eyes daily as needed (dry eyes).   guaiFENesin (MUCINEX) 600 MG 12 hr tablet Take 600 mg by mouth as needed.   loratadine (CLARITIN) 10 MG tablet Take 10 mg by mouth daily.   Menthol, Topical Analgesic, (ICY HOT EX)  Apply 1 application topically daily as needed (muscle pain).   Multiple Vitamin (MULTIVITAMIN) tablet Take 1 tablet by mouth every other day.   olmesartan-hydrochlorothiazide (BENICAR HCT) 40-12.5 MG tablet Take 1 tablet by mouth daily.   spironolactone (ALDACTONE) 25 MG tablet Take 25 mg by mouth 2 (two) times daily.   TRELEGY ELLIPTA 100-62.5-25 MCG/ACT AEPB USE 1 INHALATION BY MOUTH  DAILY        Allergies:   Penicillins    Social History         Socioeconomic History   Marital status: Divorced      Spouse name: Not on file   Number of children: Not on file   Years of education: Not on file   Highest education level: Not on file  Occupational History   Not on file  Tobacco Use   Smoking status: Former      Packs/day: 1.00      Years: 45.00      Total pack years: 45.00      Types: Cigarettes      Quit date: 03/08/2020      Years since quitting: 1.7   Smokeless tobacco: Never  Vaping Use   Vaping Use: Never used  Substance and Sexual Activity   Alcohol use: Never   Drug use: Never   Sexual activity: Not Currently  Other Topics Concern   Not on file  Social History Narrative   Not on file    Social Determinants of Health    Financial Resource Strain: Not on file  Food Insecurity: Not on file  Transportation Needs: Not on file  Physical Activity: Not on file  Stress: Not on file  Social Connections: Not on file      Family History:  The patient's  family history includes Alcoholism in her father; Dementia in her mother; Hypertension in her mother; Kidney disease in her father; Ovarian cancer in her mother.    ROS:   Please see the history of present illness.    ROS All other systems reviewed and are negative.     PHYSICAL EXAM:   VS:  BP (!) 140/58   Pulse (!) 41   Ht 5\' 1"  (1.549 m)   Wt 181 lb 6.4 oz (82.3 kg)   SpO2 96%   BMI 34.28 kg/m   Physical Exam  GEN: Well nourished, well developed, in no acute distress  Neck: no JVD, carotid bruits, or  masses Cardiac:RRR; no murmurs, rubs, or gallops  Respiratory:  clear to auscultation bilaterally, normal work  of breathing GI: soft, nontender, nondistended, + BS Ext: without cyanosis, clubbing, or edema, Good distal pulses bilaterally Neuro:  Alert and Oriented x 3, Psych: euthymic mood, full affect      Wt Readings from Last 3 Encounters:  11/27/21 181 lb 6.4 oz (82.3 kg)  11/20/21 179 lb 6.4 oz (81.4 kg)  09/11/21 176 lb 12.8 oz (80.2 kg)        Studies/Labs Reviewed:    EKG:  EKG is  ordered today.  The ekg ordered today demonstrates CHB 39/m   Recent Labs: 11/16/2021: ALT 12; BUN 39; Creatinine 1.08; Hemoglobin 12.9; Platelet Count 290; Potassium 4.3; Sodium 139    Lipid Panel Labs (Brief)  No results found for: "CHOL", "TRIG", "HDL", "CHOLHDL", "VLDL", "LDLCALC", "LDLDIRECT"     Additional studies/ records that were reviewed today include:    2D echo 06/22/2021 IMPRESSIONS     1. Left ventricular ejection fraction, by estimation, is 70 to 75%. The  left ventricle has hyperdynamic function. The left ventricle has no  regional wall motion abnormalities. Left ventricular diastolic parameters  are indeterminate. Global longitudinal  strain is normal range at -23.1%.   2. Right ventricular systolic function is normal. The right ventricular  size is normal. There is normal pulmonary artery systolic pressure. The  estimated right ventricular systolic pressure is 71.6 mmHg.   3. The mitral valve is abnormal, mildly thickened and calcified. Mild to  moderate mitral valve regurgitation.   4. The aortic valve is tricuspid. Aortic valve regurgitation is mild.   5. The inferior vena cava is normal in size with greater than 50%  respiratory variability, suggesting right atrial pressure of 3 mmHg.   Comparison(s): No prior Echocardiogram.    IMPRESSIONS     1. Left ventricular ejection fraction, by estimation, is 70 to 75%. The  left ventricle has hyperdynamic function.  The left ventricle has no  regional wall motion abnormalities. Left ventricular diastolic parameters  are indeterminate. Global longitudinal  strain is normal range at -23.1%.   2. Right ventricular systolic function is normal. The right ventricular  size is normal. There is normal pulmonary artery systolic pressure. The  estimated right ventricular systolic pressure is 96.7 mmHg.   3. The mitral valve is abnormal, mildly thickened and calcified. Mild to  moderate mitral valve regurgitation.   4. The aortic valve is tricuspid. Aortic valve regurgitation is mild.   5. The inferior vena cava is normal in size with greater than 50%  respiratory variability, suggesting right atrial pressure of 3 mmHg.   Comparison(s): No prior Echocardiogram.    Chest CTA 06/05/2021: IMPRESSION: 1. No evidence of lung cancer recurrence or metastasis in the thorax. 2. Postsurgical change in the LEFT lower lobe. 3. Centrilobular emphysema in  the upper lobes   Risk Assessment/Calculations:             ASSESSMENT:     1. CHB (complete heart block) ([place])   2. Essential hypertension   3. 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 COPD GOLD III           PLAN:  In order of problems listed above:   Complete heart block with heart rates of 39 11/20/2021 was on Toprol and this was stopped. Last dose Thurs evening. Still in CHB HR 39/m. Having DOE with little activity and some heart racing, occasional dizziness.Discussed with Dr. Domenic Polite. Will arrange for direct admit to [facility] and have EPS see for  pacemaker. Check echo.   Hypertension-stable today but likely need another agent off metoprolol   History of  squamous cell lung cancer status post left lower lobe superior segmentectomy with lymph node dissection 2021 followed by oncology   COPD   Shared Decision Making/Informed Consent          Medication Adjustments/Labs and Tests Ordered: Current medicines are reviewed at  length with the patient today.  Concerns regarding medicines are outlined above.  Medication changes, Labs and Tests ordered today are listed in the Patient Instructions below. There are no Patient Instructions on file for this visit.    Signed, Ermalinda Barrios, PA-C  11/27/2021 2:12 PM    [facility] [address], [place], [place]  [address] Phone: [phone]; Fax: [phone]

## 2021-11-27 NOTE — Patient Instructions (Signed)
Medication Instructions:  Your physician recommends that you continue on your current medications as directed. Please refer to the Current Medication list given to you today.  *If you need a refill on your cardiac medications before your next appointment, please call your pharmacy*   Lab Work: NONE   If you have labs (blood work) drawn today and your tests are completely normal, you will receive your results only by: [facility] (if you have MyChart) OR A paper copy in the mail If you have any lab test that is abnormal or we need to change your treatment, we will call you to review the results.   Testing/Procedures: NONE    Follow-Up: At [facility], you and your health needs are our priority.  As part of our continuing mission to provide you with exceptional heart care, we have created designated Provider Care Teams.  These Care Teams include your primary Cardiologist (physician) and Advanced Practice Providers (APPs -  Physician Assistants and Nurse Practitioners) who all work together to provide you with the care you need, when you need it.  We recommend signing up for the patient portal called "MyChart".  Sign up information is provided on this After Visit Summary.  MyChart is used to connect with patients for Virtual Visits (Telemedicine).  Patients are able to view lab/test results, encounter notes, upcoming appointments, etc.  Non-urgent messages can be sent to your provider as well.   To learn more about what you can do with MyChart, go to [email or web address].    Your next appointment:    2- 3 Weeks   The format for your next appointment:   In Person  Provider:   You may see Rozann Lesches, MD or one of the following Advanced Practice Providers on your designated Care Team:   Bernerd Pho, PA-C  Ermalinda Barrios, PA-C     Other Instructions You will be direct admitted to [facility] today.   Important Information About Sugar

## 2021-11-28 DIAGNOSIS — I442 Atrioventricular block, complete: Secondary | ICD-10-CM

## 2021-11-28 LAB — BASIC METABOLIC PANEL
Anion gap: 7 (ref 5–15)
BUN: 28 mg/dL — ABNORMAL HIGH (ref 8–23)
CO2: 23 mmol/L (ref 22–32)
Calcium: 9.1 mg/dL (ref 8.9–10.3)
Chloride: 109 mmol/L (ref 98–111)
Creatinine, Ser: 1.11 mg/dL — ABNORMAL HIGH (ref 0.44–1.00)
GFR, Estimated: 52 mL/min — ABNORMAL LOW (ref >60)
Glucose, Bld: 102 mg/dL — ABNORMAL HIGH (ref 70–99)
Potassium: 4 mmol/L (ref 3.5–5.1)
Sodium: 139 mmol/L (ref 135–145)

## 2021-11-28 NOTE — Plan of Care (Signed)
  Problem: Clinical Measurements: Goal: Respiratory complications will improve Outcome: Progressing   Problem: Activity: Goal: Risk for activity intolerance will decrease Outcome: Progressing   Problem: Safety: Goal: Ability to remain free from injury will improve Outcome: Progressing   

## 2021-11-28 NOTE — Consult Note (Signed)
ELECTROPHYSIOLOGY CONSULT NOTE    Patient ID: Yolanda Bauer MRN: [id number], DOB/AGE: 12/17/1945 76 y.o.  Admit date: 11/27/2021 Date of Consult: 11/28/2021  Primary Physician: Denny Levy, [place] Primary Cardiologist: Rozann Lesches, MD  Electrophysiologist:  New  Referring Provider: Dr. Radford Pax  Patient Profile: Yolanda Bauer is a 76 y.o. female with a history of lung CA s/p resection on O2, longstanding HTN treated with toprol, benicar, and spiro, and diastolic CHF who is being seen today for the evaluation of intermittent CHB at the request of Dr. Radford Pax.  HPI:  Yolanda Bauer is a 76 y.o. female with medical history as above.   Patient was seen in the oncology office 11/20/2021 with a heart rate of 39 and EKG showed complete heart block.  Dr. Domenic Polite stopped Toprol and let it wash out and presented to clinic 11/27/2021 for follow-up.  Pt noted to remain in CHB at presentation to the office, with complaints of DOE and fatigue x 2 weeks.    Pt has had occasional dizziness but no presyncope.   Last dose of toprol evening of 7/20.  EKG in office shows CHB at 39 bpm.   Telemetry shows that conduction gradually improved from CHB, to 2:1 AVB and since approx 8 pm last night has been in NSR.   She feels like her pulse is stronger and overall feels better this am. Hasn't been up walking around yet. History as above without addition.  She asks if her BP will be OK off metoprolol. No chest pain.   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 Roxan Hockey     Surgical History:  Past Surgical History:  Procedure Laterality Date   BACK SURGERY     CATARACT EXTRACTION, BILATERAL Bilateral    COLONOSCOPY WITH PROPOFOL N/A 11/21/2020   Procedure:  COLONOSCOPY WITH PROPOFOL;  Surgeon: Eloise Harman, DO;  [facility];  Service: Endoscopy;  Laterality: N/A;  ASA III / 11:00   HERNIA REPAIR     INTERCOSTAL NERVE BLOCK Left 04/11/2020   Procedure: INTERCOSTAL NERVE BLOCK;  Surgeon: [name], MD;  Location: [place];  Service: Thoracic;  Laterality: Left;   NODE DISSECTION Left 04/11/2020   Procedure: NODE DISSECTION;  Surgeon: [name], MD;  Location: [facility];  Service: Thoracic;  Laterality: Left;   POLYPECTOMY  11/21/2020   Procedure: POLYPECTOMY;  Surgeon: Eloise Harman, DO;  [facility];  Service: Endoscopy;;   TUBAL LIGATION     XI ROBOTIC ASSISTED THORACOSCOPY- SEGMENTECTOMY Left 04/11/2020   Procedure: XI ROBOTIC ASSISTED THORACOSCOPY-LEFT LOWER LOBE SUPERIOR SEGMENTECTOMY;  Surgeon: [name], MD;  Location: [facility];  Service: Thoracic;  Laterality: Left;     Medications Prior to Admission  Medication Sig Dispense Refill Last Dose   acetaminophen (TYLENOL) 325 MG tablet Take 650 mg by mouth every 6 (six) hours as needed for moderate pain.   Past Month   albuterol (VENTOLIN HFA) 108 (90 Base) MCG/ACT inhaler Inhale 2 puffs into the lungs every 6 (six) hours as needed for wheezing. 8 g 6 Past Week   Ascorbic Acid (VITAMIN C) 500 MG tablet Take 500 mg by mouth  every other day.   11/25/2021   aspirin 81 MG tablet Take 81 mg by mouth daily.   6/38/4665   bismuth subsalicylate (PEPTO BISMOL) 262 MG/15ML suspension Take 30 mLs by mouth every 6 (six) hours as needed for indigestion.   Past Week   Coral Calcium 1000 (390 CA) MG TABS Take 1,000 mg by mouth every other day.   11/25/2021   fluticasone (FLONASE) 50 MCG/ACT nasal spray Place 1 spray into both nostrils daily as needed for allergies.    11/26/2021   Glycerin-Hypromellose-PEG 400 (DRY EYE RELIEF DROPS OP) Place 1 drop into both eyes every other day.   11/25/2021   guaiFENesin (MUCINEX) 600 MG 12 hr tablet Take 600 mg by mouth  as needed for to loosen phlegm.   Past Month   loratadine (CLARITIN) 10 MG tablet Take 10 mg by mouth daily.   11/27/2021   Menthol, Topical Analgesic, (ICY HOT EX) Apply 1 application topically daily as needed (muscle pain).   Past Week   Multiple Vitamin (MULTIVITAMIN) tablet Take 1 tablet by mouth every other day.   11/25/2021   olmesartan-hydrochlorothiazide (BENICAR HCT) 40-12.5 MG tablet Take 1 tablet by mouth daily.   11/27/2021   spironolactone (ALDACTONE) 25 MG tablet Take 25 mg by mouth 2 (two) times daily.   11/27/2021   TRELEGY ELLIPTA 100-62.5-25 MCG/ACT AEPB USE 1 INHALATION BY MOUTH  DAILY (Patient taking differently: Take 1 Inhalation by mouth daily.) 180 each 3 11/27/2021    Inpatient Medications:   aspirin EC  81 mg Oral Daily   irbesartan  300 mg Oral Daily   And   hydrochlorothiazide  12.5 mg Oral Daily   sodium chloride flush  3 mL Intravenous Q12H   spironolactone  25 mg Oral BID    Allergies:  Allergies  Allergen Reactions   Penicillins Hives    Social History   Socioeconomic History   Marital status: Divorced    Spouse name: Not on file   Number of children: Not on file   Years of education: Not on file   Highest education level: Not on file  Occupational History   Not on file  Tobacco Use   Smoking status: Former    Packs/day: 1.00    Years: 45.00    Total pack years: 45.00    Types: Cigarettes    Quit date: 03/08/2020    Years since quitting: 1.7   Smokeless tobacco: Never  Vaping Use   Vaping Use: Never used  Substance and Sexual Activity   Alcohol use: Never   Drug use: Never   Sexual activity: Not Currently  Other Topics Concern   Not on file  Social History Narrative   Not on file   Social Determinants of Health   Financial Resource Strain: Not on file  Food Insecurity: Not on file  Transportation Needs: Not on file  Physical Activity: Not on file  Stress: Not on file  Social Connections: Not on file  Intimate Partner Violence: Not  on file     Family History  Problem Relation Age of Onset   Hypertension Mother    Dementia Mother    Ovarian cancer Mother    Kidney disease Father    Alcoholism Father      Review of Systems: All other systems reviewed and are otherwise negative except as noted above.  Physical Exam: Vitals:   11/27/21 2024 11/28/21 0026 11/28/21 0351 11/28/21 0758  BP: (!) 166/53 (!) 160/67 (!) 158/59 (!) 165/75  Pulse: (!) 44 73 72 76  Resp: 18 18 18 20   Temp: 98.4 F (36.9 C) 98.4 F (36.9 C) 97.8 F (36.6 C) 97.9 F (36.6 C)  TempSrc: Oral Oral Oral Oral  SpO2: 95% 94% 92% 97%  Weight:   80.6 kg     GEN- The patient is well appearing, alert and oriented x 3 today.   HEENT: normocephalic, atraumatic; sclera clear, conjunctiva pink; hearing intact; oropharynx clear; neck supple Lungs- Clear to ausculation bilaterally, normal work of breathing.  No wheezes, rales, rhonchi Heart- Regular rate and rhythm, no murmurs, rubs or gallops GI- soft, non-tender, non-distended, bowel sounds present Extremities- no clubbing, cyanosis, or edema; DP/PT/radial pulses 2+ bilaterally MS- no significant deformity or atrophy Skin- warm and dry, no rash or lesion Psych- euthymic mood, full affect Neuro- strength and sensation are intact  Labs:   Lab Results  Component Value Date   WBC 7.0 11/27/2021   HGB 12.3 11/27/2021   HCT 37.4 11/27/2021   MCV 89.3 11/27/2021   PLT 270 11/27/2021    Recent Labs  Lab 11/27/21 1617 11/28/21 0139  NA 142 139  K 4.3 4.0  CL 106 109  CO2 24 23  BUN 34* 28*  CREATININE 1.29* 1.11*  CALCIUM 9.5 9.1  PROT 6.8  --   BILITOT 0.4  --   ALKPHOS 77  --   ALT 94*  --   AST 53*  --   GLUCOSE 102* 102*      Radiology/Studies: ECHOCARDIOGRAM COMPLETE  Result Date: 11/27/2021    ECHOCARDIOGRAM REPORT   Patient Name:   Yolanda Bauer Date of Exam: 11/27/2021 Medical Rec #:  [id number]     Height:       61.0 in Accession #:    [id number]    Weight:       181.4  lb Date of Birth:  29-Jan-1946    BSA:          1.812 m Patient Age:    42 years      BP:           195/62 mmHg Patient Gender: F             HR:           48 bpm. Exam Location:  Inpatient Procedure: 2D Echo Indications:    dyspnea  History:        Patient has prior history of Echocardiogram examinations, most                 recent 06/22/2021. Arrythmias:complete heart block,                 Signs/Symptoms:Dyspnea; Risk Factors:Hypertension.  Sonographer:    [place] Referring Phys: [place]  1. Left ventricular ejection fraction, by estimation, is 60 to 65%. The left ventricle has normal function. The left ventricle has no regional wall motion abnormalities. Left ventricular diastolic parameters are indeterminate.  2. Right ventricular systolic function is normal. The right ventricular size is normal.  3. The mitral valve is abnormal. Mild to moderate mitral valve regurgitation. No evidence of mitral stenosis.  4. The aortic valve is tricuspid. There is mild calcification of the aortic valve. There is mild thickening of the aortic valve. Aortic valve regurgitation is mild. Aortic valve sclerosis is present, with no evidence of aortic valve stenosis.  5. The inferior vena cava is normal in size with greater than 50% respiratory variability, suggesting right  atrial pressure of 3 mmHg. FINDINGS  Left Ventricle: Left ventricular ejection fraction, by estimation, is 60 to 65%. The left ventricle has normal function. The left ventricle has no regional wall motion abnormalities. The left ventricular internal cavity size was normal in size. There is  no left ventricular hypertrophy. Left ventricular diastolic parameters are indeterminate. Right Ventricle: The right ventricular size is normal. No increase in right ventricular wall thickness. Right ventricular systolic function is normal. Left Atrium: Left atrial size was normal in size. Right Atrium: Right atrial size was normal in  size. Pericardium: There is no evidence of pericardial effusion. Mitral Valve: The mitral valve is abnormal. There is moderate thickening of the mitral valve leaflet(s). There is moderate calcification of the mitral valve leaflet(s). Mild to moderate mitral valve regurgitation. No evidence of mitral valve stenosis. MV  peak gradient, 168.0 mmHg. The mean mitral valve gradient is 125.0 mmHg. Tricuspid Valve: The tricuspid valve is normal in structure. Tricuspid valve regurgitation is mild . No evidence of tricuspid stenosis. Aortic Valve: The aortic valve is tricuspid. There is mild calcification of the aortic valve. There is mild thickening of the aortic valve. Aortic valve regurgitation is mild. Aortic regurgitation PHT measures 658 msec. Aortic valve sclerosis is present,  with no evidence of aortic valve stenosis. Pulmonic Valve: The pulmonic valve was normal in structure. Pulmonic valve regurgitation is not visualized. No evidence of pulmonic stenosis. Aorta: The aortic root is normal in size and structure. Venous: The inferior vena cava is normal in size with greater than 50% respiratory variability, suggesting right atrial pressure of 3 mmHg. IAS/Shunts: No atrial level shunt detected by color flow Doppler.  LEFT VENTRICLE PLAX 2D LVIDd:         5.10 cm   Diastology LVIDs:         2.70 cm   LV e' medial: 15.40 cm/s LV PW:         0.80 cm LV IVS:        0.80 cm LVOT diam:     1.70 cm LV SV:         79 LV SV Index:   44 LVOT Area:     2.27 cm  RIGHT VENTRICLE             IVC RV S prime:     14.30 cm/s  IVC diam: 1.30 cm TAPSE (M-mode): 2.4 cm LEFT ATRIUM             Index        RIGHT ATRIUM           Index LA diam:        3.70 cm 2.04 cm/m   RA Area:     11.80 cm LA Vol (A2C):   58.6 ml 32.34 ml/m  RA Volume:   24.00 ml  13.24 ml/m LA Vol (A4C):   71.6 ml 39.51 ml/m LA Biplane Vol: 67.6 ml 37.31 ml/m  AORTIC VALVE LVOT Vmax:   174.00 cm/s LVOT Vmean:  91.000 cm/s LVOT VTI:    0.348 m AI PHT:      658  msec  AORTA Ao Root diam: 2.90 cm Ao Asc diam:  2.90 cm MITRAL VALVE MV Area VTI:  0.33 cm    SHUNTS MV Peak grad: 168.0 mmHg  Systemic VTI:  0.35 m MV Mean grad: 125.0 mmHg  Systemic Diam: 1.70 cm MV Vmax:      6.48 m/s MV Vmean:     536.0 cm/s [place]  Johnsie Cancel MD Electronically signed by Jenkins Rouge MD Signature Date/Time: 11/27/2021/6:09:42 PM    Final    CT Chest W Contrast  Result Date: 11/17/2021 CLINICAL DATA:  History of lung cancer. Follow-up exam. * Tracking Code: BO * EXAM: CT CHEST WITH CONTRAST TECHNIQUE: Multidetector CT imaging of the chest was performed during intravenous contrast administration. RADIATION DOSE REDUCTION: This exam was performed according to the departmental dose-optimization program which includes automated exposure control, adjustment of the mA and/or kV according to patient size and/or use of iterative reconstruction technique. CONTRAST:  42mL OMNIPAQUE IOHEXOL 300 MG/ML  SOLN COMPARISON:  CT chest 06/05/2021 FINDINGS: Cardiovascular: Normal heart size. Thoracic aortic vascular calcifications. Mediastinum/Nodes: No enlarged axillary, mediastinal or hilar lymphadenopathy. Normal appearance of the esophagus. Lungs/Pleura: Central airways are patent. Subpleural scarring within the lower lobes bilaterally. Centrilobular and paraseptal emphysematous change. Biapical pleuroparenchymal thickening/scarring. Stable postsurgical changes superior segment left lower lobe. No suspicious pulmonary nodules. No pleural effusion or pneumothorax. Upper Abdomen: Unremarkable. Musculoskeletal: Thoracic spine degenerative changes. No aggressive or acute appearing osseous lesions. IMPRESSION: No evidence to suggest recurrence or metastatic disease within the chest. Stable postsurgical changes left hemithorax. Aortic Atherosclerosis (ICD10-I70.0) and Emphysema (ICD10-J43.9). Electronically Signed   By: Lovey Newcomer M.D.   On: 11/17/2021 15:30    EKG: in clinic yesterday showed CHB at 39 bpm  (personally reviewed)  Baseline EKG 04/20/2021 showed NSR at 71 bpm without baseline conduction disease  TELEMETRY: conduction has gradually improved and now in NSR 60-70s (personally reviewed)  Assessment/Plan: 1.  Intermittent CHB Her conduction appears to have gradually improved since yesterday, though slightly outside of the expected window given Toprol last dose Thursday 7/20 evening.  2. HTN Continue benicar and spiro Consider titration/addition of non AV nodal agent pending disposition  3. COPD III 4. H/o lung CA s/p resection and on O2 Stable.   She has stable conduction this am without underlying conduction disease. Though slightly outside of expected window of Toprol wash out, it's possible she has a slower metabolism.  Will walk today and continue to monitor, but may not need PPM if rhythm remains stable.   For questions or updates, please contact [place] Please consult [email or web address] for contact info under Cardiology/STEMI.  Jacalyn Lefevre, PA-C  11/28/2021 9:00 AM

## 2021-11-28 NOTE — TOC Initial Note (Signed)
Transition of Care [facility]) - Initial/Assessment Note    Patient Details  Name: Yolanda Bauer MRN: [id number] Date of Birth: Dec 26, 1945  Transition of Care [facility]) CM/SW Contact:    Zenon Mayo, RN Phone Number: 11/28/2021, 2:50 PM  Clinical Narrative:                 Patient is from home alone, indep, she still drives, she has PCP- Denny Levy, she uses [facility] and [facility].  She states she has a sister in [place], one sister in [place], who both works,  a daughter in [place] ( who is not well), and a grand daughter in [employer]. She has  a scale at home, she does not weigh herself everyday, she checks her bp every 2 days, she uses no salt seasoning and sometimes eat can foods, she also incorporates some fresh vegetables sometimes.  She states she is trying to do better with her diet.  Presents with CHB, EP consulted, conts on po diuretics. TOC following.   Expected Discharge Plan: Home/Self Care Barriers to Discharge: Continued Medical Work up   Patient Goals and CMS Choice Patient states their goals for this hospitalization and ongoing recovery are:: return home   Choice offered to / list presented to : NA  Expected Discharge Plan and Services Expected Discharge Plan: Home/Self Care In-house Referral: NA Discharge Planning Services: CM Consult Post Acute Care Choice: NA Living arrangements for the past 2 months: Single Family Home                   DME Agency: NA       HH Arranged: NA          Prior Living Arrangements/Services Living arrangements for the past 2 months: Single Family Home Lives with:: Self Patient language and need for interpreter reviewed:: Yes Do you feel safe going back to the place where you live?: Yes      Need for Family Participation in Patient Care: No (Comment) Care giver support system in place?: No (comment)   Criminal Activity/Legal Involvement Pertinent to Current Situation/Hospitalization: No - Comment as  needed  Activities of Daily Living      Permission Sought/Granted                  Emotional Assessment Appearance:: Appears stated age Attitude/Demeanor/Rapport: Engaged Affect (typically observed): Appropriate Orientation: : Oriented to Place, Oriented to Self, Oriented to  Time, Oriented to Situation Alcohol / Substance Use: Not Applicable Psych Involvement: No (comment)  Admission diagnosis:  Complete heart block ([place]) [I44.2] Patient Active Problem List   Diagnosis Date Noted   Complete heart block ([place]) 11/27/2021   COPD GOLD III   07/28/2020   Chronic respiratory failure with hypoxia and hypercapnia ([place]) 07/28/2020   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 ([place]) 05/12/2020   S/P Superior Segmentectomy of Left Lower Lobe 04/11/2020   Solitary pulmonary nodule on lung CT 01/27/2020   Essential hypertension 01/27/2020   Cigarette smoker 01/27/2020   PCP:  Denny Levy, [place] Pharmacy:   [facility] [address], [place] [address] Phone: [phone] Fax: [phone]  [facility] ([place] - [place], [place] [facility] [address] [place] [address] Phone: [phone] Fax: [phone]  [facility] ([facility] ) - [place], [place]  [place] [address] Phone: [phone] Fax: [phone]     Social Determinants of Health (SDOH) Interventions    Readmission Risk Interventions     No data to display

## 2021-11-28 NOTE — Care Management Obs Status (Signed)
[place] NOTIFICATION   Patient Details  Name: Yolanda Bauer MRN: [id number] Date of Birth: 1945-05-23   Medicare Observation Status Notification Given:       Zenon Mayo, RN 11/28/2021, 2:39 PM

## 2021-11-28 NOTE — Progress Notes (Signed)
Mobility Specialist Progress Note:   11/28/21 0959  Mobility  Activity Ambulated with assistance in hallway  Level of Assistance Modified independent, requires aide device or extra time  Assistive Device None  Distance Ambulated (ft) 550 ft  Activity Response Tolerated well  $Mobility charge 1 Mobility   Pt received EOB willing to participate in mobility. No complaints of pain. Left Eob with call bell in reach and all needs met.   Pre- Mobility:HR: 81 bpm  During Mobility: Highest HR: 98 bpm; Lowest HR: 86 bpm Post Mobility:  HR: 76 bpm  Systems developer

## 2021-11-28 NOTE — Progress Notes (Signed)
Heart Failure Navigator Progress Note  Assessed for Heart & Vascular TOC clinic readiness.  Patient does not meet criteria due to HFpEF ( 60-65%) not acute HF.     Earnestine Leys, BSN, Clinical cytogeneticist Only

## 2021-11-28 NOTE — Plan of Care (Signed)
  Problem: Activity: Goal: Risk for activity intolerance will decrease Outcome: Progressing   Problem: Safety: Goal: Ability to remain free from injury will improve Outcome: Progressing   

## 2021-11-29 DIAGNOSIS — I442 Atrioventricular block, complete: Secondary | ICD-10-CM | POA: Diagnosis not present

## 2021-11-29 LAB — BASIC METABOLIC PANEL
Anion gap: 5 (ref 5–15)
BUN: 18 mg/dL (ref 8–23)
CO2: 27 mmol/L (ref 22–32)
Calcium: 9 mg/dL (ref 8.9–10.3)
Chloride: 109 mmol/L (ref 98–111)
Creatinine, Ser: 1 mg/dL (ref 0.44–1.00)
GFR, Estimated: 59 mL/min — ABNORMAL LOW (ref >60)
Glucose, Bld: 105 mg/dL — ABNORMAL HIGH (ref 70–99)
Potassium: 4.1 mmol/L (ref 3.5–5.1)
Sodium: 141 mmol/L (ref 135–145)

## 2021-11-29 NOTE — Discharge Summary (Addendum)
ELECTROPHYSIOLOGY DISCHARGE SUMMARY    Patient ID: Yolanda Bauer,  MRN: [id number], DOB/AGE: Apr 28, 1946 76 y.o.  Admit date: 11/27/2021 Discharge date: 11/29/2021  Primary Care Physician: Denny Levy, [place]  Primary Cardiologist: Rozann Lesches, MD  Electrophysiologist:  New to Dr. Quentin Ore  Primary Discharge Diagnosis:  Advanced AV block  Secondary Discharge Diagnosis:  HTN  Brief HPI: Yolanda Bauer is a 76 y.o. female with a history of lung CA, HTN admitted for Advanced AV block noted on EKG.   Hospital Course:  The patient was admitted from the office 7/24 with EKG that showed CHB block. Pt had previously been seen 7/17 by oncology and noted to be in HB at that time. Beta block was stopped.   On arrival to ED pt was in CHB, which improved over several hours to 2:1 AV block and then 1:1 NSR conduction around 8 pm that evening.  Pt maintained NSR and was observed for > 36 hrs after restoration of NSR without further bradycardia. She was ambulated in the halls with HRs up into the 80-90s with activity. They were monitored on telemetry overnight which demonstrated no further bradycardia.   The patient was examined and considered to be stable for discharge.  The patient will be seen back by cardiology in 1-2 weeks for post hospital care.     Avoid all AV nodal agents.  No indication for pacing at this time with resolution of heart block. Slightly longer than expected washout period for metoprolol was noted.   Pts BP stabilized in 696-789 systolic by discharge, so no additional BP agents added at this time to replace her BB. Re-assess as outpatient.    Physical Exam: Vitals:   11/28/21 1614 11/28/21 2053 11/29/21 0431 11/29/21 0829  BP: (!) 166/70 (!) 147/56 (!) 126/53 133/86  Pulse:  75 71 81  Resp:  18 18 18   Temp:  98.5 F (36.9 C) 97.9 F (36.6 C)   TempSrc:  Oral Oral   SpO2:  94% 93% 95%  Weight:   80.1 kg     GEN- The patient is well appearing, alert and oriented  x 3 today.   HEENT: normocephalic, atraumatic; sclera clear, conjunctiva pink; hearing intact; oropharynx clear; neck supple  Lungs- Clear to ausculation bilaterally, normal work of breathing.  No wheezes, rales, rhonchi Heart- Regular rate and rhythm, no murmurs, rubs or gallops  GI- soft, non-tender, non-distended, bowel sounds present  Extremities- no clubbing, cyanosis, or edema; DP/PT/radial pulses 2+ bilaterally, groin without hematoma/bruit MS- no significant deformity or atrophy Skin- warm and dry, no rash or lesion Psych- euthymic mood, full affect Neuro- strength and sensation are intact   Labs:   Lab Results  Component Value Date   WBC 7.0 11/27/2021   HGB 12.3 11/27/2021   HCT 37.4 11/27/2021   MCV 89.3 11/27/2021   PLT 270 11/27/2021    Recent Labs  Lab 11/27/21 1617 11/28/21 0139 11/29/21 0137  NA 142   < > 141  K 4.3   < > 4.1  CL 106   < > 109  CO2 24   < > 27  BUN 34*   < > 18  CREATININE 1.29*   < > 1.00  CALCIUM 9.5   < > 9.0  PROT 6.8  --   --   BILITOT 0.4  --   --   ALKPHOS 77  --   --   ALT 94*  --   --   AST  53*  --   --   GLUCOSE 102*   < > 105*   < > = values in this interval not displayed.     Discharge Medications:  Allergies as of 11/29/2021       Reactions   Penicillins Hives        Medication List     TAKE these medications    acetaminophen 325 MG tablet Commonly known as: TYLENOL Take 650 mg by mouth every 6 (six) hours as needed for moderate pain.   albuterol 108 (90 Base) MCG/ACT inhaler Commonly known as: VENTOLIN HFA Inhale 2 puffs into the lungs every 6 (six) hours as needed for wheezing.   aspirin 81 MG tablet Take 81 mg by mouth daily.   bismuth subsalicylate 885 OY/77AJ suspension Commonly known as: PEPTO BISMOL Take 30 mLs by mouth every 6 (six) hours as needed for indigestion.   Coral Calcium 1000 (390 Ca) MG Tabs Take 1,000 mg by mouth every other day.   DRY EYE RELIEF DROPS OP Place 1 drop into  both eyes every other day.   fluticasone 50 MCG/ACT nasal spray Commonly known as: FLONASE Place 1 spray into both nostrils daily as needed for allergies.   guaiFENesin 600 MG 12 hr tablet Commonly known as: MUCINEX Take 600 mg by mouth as needed for to loosen phlegm.   ICY HOT EX Apply 1 application topically daily as needed (muscle pain).   loratadine 10 MG tablet Commonly known as: CLARITIN Take 10 mg by mouth daily.   multivitamin tablet Take 1 tablet by mouth every other day.   olmesartan-hydrochlorothiazide 40-12.5 MG tablet Commonly known as: BENICAR HCT Take 1 tablet by mouth daily.   spironolactone 25 MG tablet Commonly known as: ALDACTONE Take 25 mg by mouth 2 (two) times daily.   Trelegy Ellipta 100-62.5-25 MCG/ACT Aepb Generic drug: Fluticasone-Umeclidin-Vilant USE 1 INHALATION BY MOUTH  DAILY What changed: See the new instructions.   vitamin C 500 MG tablet Commonly known as: ASCORBIC ACID Take 500 mg by mouth every other day.        Disposition:    Follow-up Information     [facility] Follow up.   Why: on 8/8 at 1115 for post hospital follow up Contact information: [place] [address] [phone]                Duration of Discharge Encounter: Greater than 30 minutes including physician time.  Jacalyn Lefevre, PA-C  11/29/2021 9:09 AM

## 2021-11-29 NOTE — Progress Notes (Signed)
Mobility Specialist Progress Note:   11/29/21 0920  Mobility  Activity Ambulated with assistance in hallway  Level of Assistance Modified independent, requires aide device or extra time  Assistive Device None  Distance Ambulated (ft) 550 ft  Activity Response Tolerated well  $Mobility charge 1 Mobility   Pt received EOB willing to participate in mobility. No complaints of pain. Left EOB with call bell in reach and all needs met.   [facility] Endora Teresi Mobility Specialist

## 2021-11-29 NOTE — TOC Transition Note (Signed)
Transition of Care [facility]) - CM/SW Discharge Note   Patient Details  Name: Yolanda Bauer MRN: [id number] Date of Birth: January 04, 1946  Transition of Care [facility]) CM/SW Contact:  Zenon Mayo, RN Phone Number: 11/29/2021, 9:49 AM   Clinical Narrative:    Patient is for dc today, she states her daughter will come to transport her home.  She has no needs.   Final next level of care: Home/Self Care Barriers to Discharge: Continued Medical Work up   Patient Goals and CMS Choice Patient states their goals for this hospitalization and ongoing recovery are:: return home   Choice offered to / list presented to : NA  Discharge Placement                       Discharge Plan and Services In-house Referral: NA Discharge Planning Services: CM Consult Post Acute Care Choice: NA            DME Agency: NA       HH Arranged: NA          Social Determinants of Health (SDOH) Interventions     Readmission Risk Interventions     No data to display

## 2021-11-30 ENCOUNTER — Institutional Professional Consult (permissible substitution): Payer: Medicare Other | Admitting: Internal Medicine

## 2021-12-07 NOTE — H&P (View-Only) (Signed)
Cardiology Office Note    Date:  12/12/2021   ID:  Yolanda Bauer, DOB Nov 13, 1945, MRN [id number]   PCP:  Denny Levy, [place]  Cardiologist:  Rozann Lesches, MD   Advanced Practice Provider:  No care team member to display Electrophysiologist:  None   [id number]   Chief Complaint  Patient presents with   Hospitalization Follow-up    History of Present Illness:  Yolanda Bauer is a 76 y.o. female with history of lung CA status post surgery on O2, longstanding hypertension treated with Toprol, Benicar HCT and Aldactone in the past.  Patient saw Dr. Domenic Polite 06/06/2021 because of elevated blood pressures subconjunctival hemorrhage and mildly elevated BNP.  2D echo ordered and showed vigorous LVEF 70 to 75% with normal strain measurements mild to moderate MR.  No changes made.   Patient was seen in the oncology office 11/20/2021 with a heart rate of 39 and EKG showed complete heart block.  Dr. Domenic Polite told them to stop her Toprol and let it wash out. She was still in CHB with HR 39/m 11/27/21 and admitted for possible pacemaker. Fortunately her CHB resolved and she didn't require this.   Patient says when in church Sunday sitting down and she felt dizzy and faint and her pulse was 52. It passed and she's felt fine since. BP running high.    Past Medical History:  Diagnosis Date   Anxiety    Arthritis    Essential hypertension    Hyperlipidemia    On home oxygen therapy    Evenings   Pneumonia    Squamous cell lung cancer ([place]) 03/2020   Status post left lower lobe superior segmentectomy with lymph node dissection - Dr. Roxan Hockey    Past Surgical History:  Procedure Laterality Date   BACK SURGERY     CATARACT EXTRACTION, BILATERAL Bilateral    COLONOSCOPY WITH PROPOFOL N/A 11/21/2020   Procedure: COLONOSCOPY WITH PROPOFOL;  Surgeon: Eloise Harman, DO;  [facility];  Service: Endoscopy;  Laterality: N/A;  ASA III / 11:00    HERNIA REPAIR     INTERCOSTAL NERVE BLOCK Left 04/11/2020   Procedure: INTERCOSTAL NERVE BLOCK;  Surgeon: [name], MD;  Location: [place];  Service: Thoracic;  Laterality: Left;   NODE DISSECTION Left 04/11/2020   Procedure: NODE DISSECTION;  Surgeon: [name], MD;  Location: [place];  Service: Thoracic;  Laterality: Left;   POLYPECTOMY  11/21/2020   Procedure: POLYPECTOMY;  Surgeon: Eloise Harman, DO;  [facility];  Service: Endoscopy;;   TUBAL LIGATION     XI ROBOTIC ASSISTED THORACOSCOPY- SEGMENTECTOMY Left 04/11/2020   Procedure: XI ROBOTIC ASSISTED THORACOSCOPY-LEFT LOWER LOBE SUPERIOR SEGMENTECTOMY;  Surgeon: [name], MD;  Location: [facility];  Service: Thoracic;  Laterality: Left;    Current Medications: Current Meds  Medication Sig   acetaminophen (TYLENOL) 325 MG tablet Take 650 mg by mouth every 6 (six) hours as needed for moderate pain.   albuterol (VENTOLIN HFA) 108 (90 Base) MCG/ACT inhaler Inhale 2 puffs into the lungs every 6 (six) hours as needed for wheezing.   amLODipine (NORVASC) 5 MG tablet Take 1 tablet (5 mg total) by mouth daily.   Ascorbic Acid (VITAMIN C) 500 MG tablet Take 500 mg by mouth every other day.   aspirin 81 MG tablet Take 81 mg by mouth daily.   atorvastatin (LIPITOR) 40 MG tablet Take 40 mg by  mouth daily.   bismuth subsalicylate (PEPTO BISMOL) 262 MG/15ML suspension Take 30 mLs by mouth every 6 (six) hours as needed for indigestion.   Coral Calcium 1000 (390 CA) MG TABS Take 1,000 mg by mouth every other day.   fluticasone (FLONASE) 50 MCG/ACT nasal spray Place 1 spray into both nostrils daily as needed for allergies.    Glycerin-Hypromellose-PEG 400 (DRY EYE RELIEF DROPS OP) Place 1 drop into both eyes every other day.   guaiFENesin (MUCINEX) 600 MG 12 hr tablet Take 600 mg by mouth as needed for to loosen phlegm.   loratadine (CLARITIN) 10 MG tablet Take 10 mg by mouth daily.   Menthol, Topical  Analgesic, (ICY HOT EX) Apply 1 application topically daily as needed (muscle pain).   Multiple Vitamin (MULTIVITAMIN) tablet Take 1 tablet by mouth every other day.   olmesartan-hydrochlorothiazide (BENICAR HCT) 40-12.5 MG tablet Take 1 tablet by mouth daily.   spironolactone (ALDACTONE) 25 MG tablet Take 25 mg by mouth 2 (two) times daily.   TRELEGY ELLIPTA 100-62.5-25 MCG/ACT AEPB USE 1 INHALATION BY MOUTH  DAILY (Patient taking differently: Take 1 Inhalation by mouth daily.)     Allergies:   Penicillins   Social History   Socioeconomic History   Marital status: Divorced    Spouse name: Not on file   Number of children: Not on file   Years of education: Not on file   Highest education level: Not on file  Occupational History   Not on file  Tobacco Use   Smoking status: Former    Packs/day: 1.00    Years: 45.00    Total pack years: 45.00    Types: Cigarettes    Quit date: 03/08/2020    Years since quitting: 1.7   Smokeless tobacco: Never  Vaping Use   Vaping Use: Never used  Substance and Sexual Activity   Alcohol use: Never   Drug use: Never   Sexual activity: Not Currently  Other Topics Concern   Not on file  Social History Narrative   Not on file   Social Determinants of Health   Financial Resource Strain: Not on file  Food Insecurity: Not on file  Transportation Needs: Not on file  Physical Activity: Not on file  Stress: Not on file  Social Connections: Not on file     Family History:  The patient's  family history includes Alcoholism in her father; Dementia in her mother; Hypertension in her mother; Kidney disease in her father; Ovarian cancer in her mother.   ROS:   Please see the history of present illness.    ROS All other systems reviewed and are negative.   PHYSICAL EXAM:   VS:  BP (!) 164/60   Pulse (!) 45   Ht 5\' 1"  (1.549 m)   Wt 175 lb 6.4 oz (79.6 kg)   SpO2 96%   BMI 33.14 kg/m   Physical Exam  GEN: Obese, in no acute distress  Neck:  no JVD, carotid bruits, or masses Cardiac:RRR; no murmurs, rubs, or gallops  Respiratory:  clear to auscultation bilaterally, normal work of breathing GI: soft, nontender, nondistended, + BS Ext: without cyanosis, clubbing, or edema, Good distal pulses bilaterally Neuro:  Alert and Oriented x 3,  Psych: euthymic mood, full affect  Wt Readings from Last 3 Encounters:  12/12/21 175 lb 6.4 oz (79.6 kg)  11/29/21 176 lb 8 oz (80.1 kg)  11/27/21 181 lb 6.4 oz (82.3 kg)      Studies/Labs Reviewed:  EKG:  EKG is  ordered today.  The ekg ordered today demonstrates 2:1 heart block at 39 reviewed with Dr. Curt Bears  Recent Labs: 11/27/2021: ALT 94; B Natriuretic Peptide 658.7; Hemoglobin 12.3; Magnesium 2.1; Platelets 270; TSH 0.826 11/29/2021: BUN 18; Creatinine, Ser 1.00; Potassium 4.1; Sodium 141   Lipid Panel No results found for: "CHOL", "TRIG", "HDL", "CHOLHDL", "VLDL", "LDLCALC", "LDLDIRECT"  Additional studies/ records that were reviewed today include:  2D echo 06/22/2021 IMPRESSIONS     1. Left ventricular ejection fraction, by estimation, is 70 to 75%. The  left ventricle has hyperdynamic function. The left ventricle has no  regional wall motion abnormalities. Left ventricular diastolic parameters  are indeterminate. Global longitudinal  strain is normal range at -23.1%.   2. Right ventricular systolic function is normal. The right ventricular  size is normal. There is normal pulmonary artery systolic pressure. The  estimated right ventricular systolic pressure is 02.6 mmHg.   3. The mitral valve is abnormal, mildly thickened and calcified. Mild to  moderate mitral valve regurgitation.   4. The aortic valve is tricuspid. Aortic valve regurgitation is mild.   5. The inferior vena cava is normal in size with greater than 50%  respiratory variability, suggesting right atrial pressure of 3 mmHg.   Comparison(s): No prior Echocardiogram.    IMPRESSIONS     1. Left ventricular  ejection fraction, by estimation, is 70 to 75%. The  left ventricle has hyperdynamic function. The left ventricle has no  regional wall motion abnormalities. Left ventricular diastolic parameters  are indeterminate. Global longitudinal  strain is normal range at -23.1%.   2. Right ventricular systolic function is normal. The right ventricular  size is normal. There is normal pulmonary artery systolic pressure. The  estimated right ventricular systolic pressure is 37.8 mmHg.   3. The mitral valve is abnormal, mildly thickened and calcified. Mild to  moderate mitral valve regurgitation.   4. The aortic valve is tricuspid. Aortic valve regurgitation is mild.   5. The inferior vena cava is normal in size with greater than 50%  respiratory variability, suggesting right atrial pressure of 3 mmHg.   Comparison(s): No prior Echocardiogram.    Chest CTA 06/05/2021: IMPRESSION: 1. No evidence of lung cancer recurrence or metastasis in the thorax. 2. Postsurgical change in the LEFT lower lobe. 3. Centrilobular emphysema in  the upper lobes     Risk Assessment/Calculations:         ASSESSMENT:    1. CHB (complete heart block) ([place])   2. Essential hypertension   3. 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 COPD GOLD III        PLAN:  In order of problems listed above:  Complete heart block with heart rates of 39 11/20/2021 was on Toprol and this was stopped. She was still in CHB HR 39/m at f/u and was having DOE with little activity and some heart racing, so we arranged for direct admit to [facility] and EPS to see for pacemaker but CHB resolved and she was sent home. Echo 11/05/21 LVEF 60-65% mild to mod MR. She had an episode of dizziness Sunday sitting at church and HR was 52. Symptoms passed quickly. EKG today 2:1 heart block at 42/m. Reviewed with Dr. Curt Bears who recommends pacemaker this week. Labs are stable. She will be scheduled for tomorrow.    Hypertension-elevated today, will add amlodipine 5 mg daily.   History of  squamous cell lung cancer status  post left lower lobe superior segmentectomy with lymph node dissection 2021 followed by oncology   COPD    Shared Decision Making/Informed Consent        Medication Adjustments/Labs and Tests Ordered: Current medicines are reviewed at length with the patient today.  Concerns regarding medicines are outlined above.  Medication changes, Labs and Tests ordered today are listed in the Patient Instructions below. Patient Instructions  Medication Instructions:  Your physician has recommended you make the following change in your medication:   START: Amlodipine 5mg  daily  *If you need a refill on your cardiac medications before your next appointment, please call your pharmacy*   Lab Work: None If you have labs (blood work) drawn today and your tests are completely normal, you will receive your results only by: [place] (if you have MyChart) OR A paper copy in the mail If you have any lab test that is abnormal or we need to change your treatment, we will call you to review the results.   Follow-Up: At [facility], you and your health needs are our priority.  As part of our continuing mission to provide you with exceptional heart care, we have created designated Provider Care Teams.  These Care Teams include your primary Cardiologist (physician) and Advanced Practice Providers (APPs -  Physician Assistants and Nurse Practitioners) who all work together to provide you with the care you need, when you need it.   Your next appointment:   We will call you to schedule   Signed, Ermalinda Barrios, PA-C  12/12/2021 1:45 PM    [facility] [address], [place], [place]  [address] Phone: [phone]; Fax: [phone]

## 2021-12-07 NOTE — Progress Notes (Signed)
Cardiology Office Note    Date:  12/12/2021   ID:  Yolanda Bauer, DOB November 14, 1945, MRN [id number]   PCP:  Denny Levy, [place]  Cardiologist:  Rozann Lesches, MD   Advanced Practice Provider:  No care team member to display Electrophysiologist:  None   [id number]   Chief Complaint  Patient presents with   Hospitalization Follow-up    History of Present Illness:  Yolanda Bauer is a 76 y.o. female with history of lung CA status post surgery on O2, longstanding hypertension treated with Toprol, Benicar HCT and Aldactone in the past.  Patient saw Dr. Domenic Polite 06/06/2021 because of elevated blood pressures subconjunctival hemorrhage and mildly elevated BNP.  2D echo ordered and showed vigorous LVEF 70 to 75% with normal strain measurements mild to moderate MR.  No changes made.   Patient was seen in the oncology office 11/20/2021 with a heart rate of 39 and EKG showed complete heart block.  Dr. Domenic Polite told them to stop her Toprol and let it wash out. She was still in CHB with HR 39/m 11/27/21 and admitted for possible pacemaker. Fortunately her CHB resolved and she didn't require this.   Patient says when in church Sunday sitting down and she felt dizzy and faint and her pulse was 52. It passed and she's felt fine since. BP running high.    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 Roxan Hockey    Past Surgical History:  Procedure Laterality Date   BACK SURGERY     CATARACT EXTRACTION, BILATERAL Bilateral    COLONOSCOPY WITH PROPOFOL N/A 11/21/2020   Procedure: COLONOSCOPY WITH PROPOFOL;  Surgeon: Eloise Harman, DO;  [facility];  Service: Endoscopy;  Laterality: N/A;  ASA III / 11:00    HERNIA REPAIR     INTERCOSTAL NERVE BLOCK Left 04/11/2020   Procedure: INTERCOSTAL NERVE BLOCK;  Surgeon: [name], MD;  Location: [facility];  Service: Thoracic;  Laterality: Left;   NODE DISSECTION Left 04/11/2020   Procedure: NODE DISSECTION;  Surgeon: [name], MD;  Location: [facility];  Service: Thoracic;  Laterality: Left;   POLYPECTOMY  11/21/2020   Procedure: POLYPECTOMY;  Surgeon: Eloise Harman, DO;  [facility];  Service: Endoscopy;;   TUBAL LIGATION     XI ROBOTIC ASSISTED THORACOSCOPY- SEGMENTECTOMY Left 04/11/2020   Procedure: XI ROBOTIC ASSISTED THORACOSCOPY-LEFT LOWER LOBE SUPERIOR SEGMENTECTOMY;  Surgeon: [name], MD;  Location: [facility];  Service: Thoracic;  Laterality: Left;    Current Medications: Current Meds  Medication Sig   acetaminophen (TYLENOL) 325 MG tablet Take 650 mg by mouth every 6 (six) hours as needed for moderate pain.   albuterol (VENTOLIN HFA) 108 (90 Base) MCG/ACT inhaler Inhale 2 puffs into the lungs every 6 (six) hours as needed for wheezing.   amLODipine (NORVASC) 5 MG tablet Take 1 tablet (5 mg total) by mouth daily.   Ascorbic Acid (VITAMIN C) 500 MG tablet Take 500 mg by mouth every other day.   aspirin 81 MG tablet Take 81 mg by mouth daily.   atorvastatin (LIPITOR) 40 MG tablet Take 40 mg by  mouth daily.   bismuth subsalicylate (PEPTO BISMOL) 262 MG/15ML suspension Take 30 mLs by mouth every 6 (six) hours as needed for indigestion.   Coral Calcium 1000 (390 CA) MG TABS Take 1,000 mg by mouth every other day.   fluticasone (FLONASE) 50 MCG/ACT nasal spray Place 1 spray into both nostrils daily as needed for allergies.    Glycerin-Hypromellose-PEG 400 (DRY EYE RELIEF DROPS OP) Place 1 drop into both eyes every other day.   guaiFENesin (MUCINEX) 600 MG 12 hr tablet Take 600 mg by mouth as needed for to loosen phlegm.   loratadine (CLARITIN) 10 MG tablet Take 10 mg by mouth daily.   Menthol, Topical  Analgesic, (ICY HOT EX) Apply 1 application topically daily as needed (muscle pain).   Multiple Vitamin (MULTIVITAMIN) tablet Take 1 tablet by mouth every other day.   olmesartan-hydrochlorothiazide (BENICAR HCT) 40-12.5 MG tablet Take 1 tablet by mouth daily.   spironolactone (ALDACTONE) 25 MG tablet Take 25 mg by mouth 2 (two) times daily.   TRELEGY ELLIPTA 100-62.5-25 MCG/ACT AEPB USE 1 INHALATION BY MOUTH  DAILY (Patient taking differently: Take 1 Inhalation by mouth daily.)     Allergies:   Penicillins   Social History   Socioeconomic History   Marital status: Divorced    Spouse name: Not on file   Number of children: Not on file   Years of education: Not on file   Highest education level: Not on file  Occupational History   Not on file  Tobacco Use   Smoking status: Former    Packs/day: 1.00    Years: 45.00    Total pack years: 45.00    Types: Cigarettes    Quit date: 03/08/2020    Years since quitting: 1.7   Smokeless tobacco: Never  Vaping Use   Vaping Use: Never used  Substance and Sexual Activity   Alcohol use: Never   Drug use: Never   Sexual activity: Not Currently  Other Topics Concern   Not on file  Social History Narrative   Not on file   Social Determinants of Health   Financial Resource Strain: Not on file  Food Insecurity: Not on file  Transportation Needs: Not on file  Physical Activity: Not on file  Stress: Not on file  Social Connections: Not on file     Family History:  The patient's  family history includes Alcoholism in her father; Dementia in her mother; Hypertension in her mother; Kidney disease in her father; Ovarian cancer in her mother.   ROS:   Please see the history of present illness.    ROS All other systems reviewed and are negative.   PHYSICAL EXAM:   VS:  BP (!) 164/60   Pulse (!) 45   Ht 5\' 1"  (1.549 m)   Wt 175 lb 6.4 oz (79.6 kg)   SpO2 96%   BMI 33.14 kg/m   Physical Exam  GEN: Obese, in no acute distress  Neck:  no JVD, carotid bruits, or masses Cardiac:RRR; no murmurs, rubs, or gallops  Respiratory:  clear to auscultation bilaterally, normal work of breathing GI: soft, nontender, nondistended, + BS Ext: without cyanosis, clubbing, or edema, Good distal pulses bilaterally Neuro:  Alert and Oriented x 3,  Psych: euthymic mood, full affect  Wt Readings from Last 3 Encounters:  12/12/21 175 lb 6.4 oz (79.6 kg)  11/29/21 176 lb 8 oz (80.1 kg)  11/27/21 181 lb 6.4 oz (82.3 kg)      Studies/Labs Reviewed:  EKG:  EKG is  ordered today.  The ekg ordered today demonstrates 2:1 heart block at 39 reviewed with Dr. Curt Bears  Recent Labs: 11/27/2021: ALT 94; B Natriuretic Peptide 658.7; Hemoglobin 12.3; Magnesium 2.1; Platelets 270; TSH 0.826 11/29/2021: BUN 18; Creatinine, Ser 1.00; Potassium 4.1; Sodium 141   Lipid Panel No results found for: "CHOL", "TRIG", "HDL", "CHOLHDL", "VLDL", "LDLCALC", "LDLDIRECT"  Additional studies/ records that were reviewed today include:  2D echo 06/22/2021 IMPRESSIONS     1. Left ventricular ejection fraction, by estimation, is 70 to 75%. The  left ventricle has hyperdynamic function. The left ventricle has no  regional wall motion abnormalities. Left ventricular diastolic parameters  are indeterminate. Global longitudinal  strain is normal range at -23.1%.   2. Right ventricular systolic function is normal. The right ventricular  size is normal. There is normal pulmonary artery systolic pressure. The  estimated right ventricular systolic pressure is 99.2 mmHg.   3. The mitral valve is abnormal, mildly thickened and calcified. Mild to  moderate mitral valve regurgitation.   4. The aortic valve is tricuspid. Aortic valve regurgitation is mild.   5. The inferior vena cava is normal in size with greater than 50%  respiratory variability, suggesting right atrial pressure of 3 mmHg.   Comparison(s): No prior Echocardiogram.    IMPRESSIONS     1. Left ventricular  ejection fraction, by estimation, is 70 to 75%. The  left ventricle has hyperdynamic function. The left ventricle has no  regional wall motion abnormalities. Left ventricular diastolic parameters  are indeterminate. Global longitudinal  strain is normal range at -23.1%.   2. Right ventricular systolic function is normal. The right ventricular  size is normal. There is normal pulmonary artery systolic pressure. The  estimated right ventricular systolic pressure is 42.6 mmHg.   3. The mitral valve is abnormal, mildly thickened and calcified. Mild to  moderate mitral valve regurgitation.   4. The aortic valve is tricuspid. Aortic valve regurgitation is mild.   5. The inferior vena cava is normal in size with greater than 50%  respiratory variability, suggesting right atrial pressure of 3 mmHg.   Comparison(s): No prior Echocardiogram.    Chest CTA 06/05/2021: IMPRESSION: 1. No evidence of lung cancer recurrence or metastasis in the thorax. 2. Postsurgical change in the LEFT lower lobe. 3. Centrilobular emphysema in  the upper lobes     Risk Assessment/Calculations:         ASSESSMENT:    1. CHB (complete heart block) ([place])   2. Essential hypertension   3. 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 COPD GOLD III        PLAN:  In order of problems listed above:  Complete heart block with heart rates of 39 11/20/2021 was on Toprol and this was stopped. She was still in CHB HR 39/m at f/u and was having DOE with little activity and some heart racing, so we arranged for direct admit to [facility] and EPS to see for pacemaker but CHB resolved and she was sent home. Echo 11/05/21 LVEF 60-65% mild to mod MR. She had an episode of dizziness Sunday sitting at church and HR was 52. Symptoms passed quickly. EKG today 2:1 heart block at 42/m. Reviewed with Dr. Curt Bears who recommends pacemaker this week. Labs are stable. She will be scheduled for tomorrow.    Hypertension-elevated today, will add amlodipine 5 mg daily.   History of  squamous cell lung cancer status  post left lower lobe superior segmentectomy with lymph node dissection 2021 followed by oncology   COPD    Shared Decision Making/Informed Consent        Medication Adjustments/Labs and Tests Ordered: Current medicines are reviewed at length with the patient today.  Concerns regarding medicines are outlined above.  Medication changes, Labs and Tests ordered today are listed in the Patient Instructions below. Patient Instructions  Medication Instructions:  Your physician has recommended you make the following change in your medication:   START: Amlodipine 5mg  daily  *If you need a refill on your cardiac medications before your next appointment, please call your pharmacy*   Lab Work: None If you have labs (blood work) drawn today and your tests are completely normal, you will receive your results only by: [facility] (if you have MyChart) OR A paper copy in the mail If you have any lab test that is abnormal or we need to change your treatment, we will call you to review the results.   Follow-Up: At [facility], you and your health needs are our priority.  As part of our continuing mission to provide you with exceptional heart care, we have created designated Provider Care Teams.  These Care Teams include your primary Cardiologist (physician) and Advanced Practice Providers (APPs -  Physician Assistants and Nurse Practitioners) who all work together to provide you with the care you need, when you need it.   Your next appointment:   We will call you to schedule   Signed, Ermalinda Barrios, PA-C  12/12/2021 1:45 PM    [facility] [address], [place], [place]  [address] Phone: [phone]; Fax: [phone]

## 2021-12-12 ENCOUNTER — Encounter: Payer: Self-pay | Admitting: Physician Assistant

## 2021-12-12 ENCOUNTER — Ambulatory Visit: Payer: Medicare Other | Admitting: Physician Assistant

## 2021-12-12 ENCOUNTER — Telehealth: Payer: Self-pay

## 2021-12-12 VITALS — BP 164/60 | HR 45 | Ht 61.0 in | Wt 175.4 lb

## 2021-12-12 DIAGNOSIS — I442 Atrioventricular block, complete: Secondary | ICD-10-CM | POA: Diagnosis not present

## 2021-12-12 DIAGNOSIS — J449 Chronic obstructive pulmonary disease, unspecified: Secondary | ICD-10-CM

## 2021-12-12 DIAGNOSIS — C3432 Malignant neoplasm of lower lobe, left bronchus or lung: Secondary | ICD-10-CM | POA: Diagnosis not present

## 2021-12-12 DIAGNOSIS — I10 Essential (primary) hypertension: Secondary | ICD-10-CM | POA: Diagnosis not present

## 2021-12-12 MED ORDER — AMLODIPINE BESYLATE 5 MG PO TABS: 5.0000 mg | ORAL_TABLET | Freq: Every day | ORAL | 3 refills | Status: DC

## 2021-12-12 NOTE — Patient Instructions (Addendum)
Medication Instructions:  Your physician has recommended you make the following change in your medication:   START: Amlodipine 5mg  daily  *If you need a refill on your cardiac medications before your next appointment, please call your pharmacy*   Lab Work: None If you have labs (blood work) drawn today and your tests are completely normal, you will receive your results only by: [place] (if you have MyChart) OR A paper copy in the mail If you have any lab test that is abnormal or we need to change your treatment, we will call you to review the results.   Follow-Up: At [facility], you and your health needs are our priority.  As part of our continuing mission to provide you with exceptional heart care, we have created designated Provider Care Teams.  These Care Teams include your primary Cardiologist (physician) and Advanced Practice Providers (APPs -  Physician Assistants and Nurse Practitioners) who all work together to provide you with the care you need, when you need it.   Your next appointment:   We will call you to schedule

## 2021-12-12 NOTE — Telephone Encounter (Signed)
Outreach made to Pt.  She prefers her PPM be implanted ASAP.  Pt scheduled for PPM implant 12/13/21 at 4:30 pm with DR. Camnitz.  Instructions given.  Work up complete.

## 2021-12-12 NOTE — Pre-Procedure Instructions (Signed)
Instructed patient on the following items: Arrival time 2:30 Nothing to eat or drink after midnight No meds AM of procedure Responsible person to drive you home and stay with you for 24 hrs Wash with special soap night before and morning of procedure

## 2021-12-13 ENCOUNTER — Ambulatory Visit (HOSPITAL_COMMUNITY)
Admission: RE | Admit: 2021-12-13 | Discharge: 2021-12-14 | Disposition: A | Discharge status: Home / Self Care | Payer: Medicare Other | Attending: Cardiology | Admitting: Cardiology

## 2021-12-13 ENCOUNTER — Ambulatory Visit (HOSPITAL_COMMUNITY)
Admission: RE | Disposition: A | Discharge status: Home / Self Care | Payer: Self-pay | Source: Home / Self Care | Attending: Cardiology

## 2021-12-13 ENCOUNTER — Other Ambulatory Visit: Payer: Self-pay

## 2021-12-13 ENCOUNTER — Encounter (HOSPITAL_COMMUNITY): Payer: Self-pay | Admitting: Cardiology

## 2021-12-13 DIAGNOSIS — Z79899 Other long term (current) drug therapy: Secondary | ICD-10-CM | POA: Insufficient documentation

## 2021-12-13 DIAGNOSIS — I10 Essential (primary) hypertension: Secondary | ICD-10-CM | POA: Diagnosis not present

## 2021-12-13 DIAGNOSIS — Z87891 Personal history of nicotine dependence: Secondary | ICD-10-CM | POA: Insufficient documentation

## 2021-12-13 DIAGNOSIS — Z9981 Dependence on supplemental oxygen: Secondary | ICD-10-CM | POA: Insufficient documentation

## 2021-12-13 DIAGNOSIS — Z23 Encounter for immunization: Secondary | ICD-10-CM | POA: Insufficient documentation

## 2021-12-13 DIAGNOSIS — J449 Chronic obstructive pulmonary disease, unspecified: Secondary | ICD-10-CM | POA: Diagnosis not present

## 2021-12-13 DIAGNOSIS — Z85118 Personal history of other malignant neoplasm of bronchus and lung: Secondary | ICD-10-CM | POA: Diagnosis not present

## 2021-12-13 DIAGNOSIS — I441 Atrioventricular block, second degree: Secondary | ICD-10-CM | POA: Diagnosis present

## 2021-12-13 HISTORY — PX: PACEMAKER IMPLANT: EP1218

## 2021-12-13 SURGERY — PACEMAKER IMPLANT

## 2021-12-13 MED ORDER — POVIDONE-IODINE 10 % EX SWAB: 2.0000 | Freq: Once | CUTANEOUS | Status: DC

## 2021-12-13 MED ORDER — VANCOMYCIN HCL IN DEXTROSE 1-5 GM/200ML-% IV SOLN
INTRAVENOUS | Status: AC
  Filled 2021-12-13: qty 200

## 2021-12-13 MED ORDER — ACETAMINOPHEN 325 MG PO TABS
650.0000 mg | ORAL_TABLET | Freq: Four times a day (QID) | ORAL | Status: DC | PRN
  Administered 2021-12-13 – 2021-12-14 (×2): 650 mg via ORAL
  Filled 2021-12-13: qty 2

## 2021-12-13 MED ORDER — MIDAZOLAM HCL 5 MG/5ML IJ SOLN
INTRAMUSCULAR | Status: AC
  Filled 2021-12-13: qty 5

## 2021-12-13 MED ORDER — VANCOMYCIN HCL IN DEXTROSE 1-5 GM/200ML-% IV SOLN
1000.0000 mg | Freq: Two times a day (BID) | INTRAVENOUS | Status: AC
  Administered 2021-12-14: 1000 mg via INTRAVENOUS
  Filled 2021-12-13: qty 200

## 2021-12-13 MED ORDER — CHLORHEXIDINE GLUCONATE 4 % EX LIQD: 4.0000 | Freq: Once | CUTANEOUS | Status: DC

## 2021-12-13 MED ORDER — SPIRONOLACTONE 25 MG PO TABS
25.0000 mg | ORAL_TABLET | Freq: Two times a day (BID) | ORAL | Status: DC
  Administered 2021-12-13 – 2021-12-14 (×2): 25 mg via ORAL
  Filled 2021-12-13 (×2): qty 1

## 2021-12-13 MED ORDER — GUAIFENESIN ER 600 MG PO TB12: 600.0000 mg | ORAL_TABLET | Freq: Two times a day (BID) | ORAL | Status: DC | PRN

## 2021-12-13 MED ORDER — CALCIUM CARBONATE 1250 (500 CA) MG PO TABS
1000.0000 mg | ORAL_TABLET | ORAL | Status: DC
  Administered 2021-12-13: 2500 mg via ORAL
  Filled 2021-12-13: qty 2

## 2021-12-13 MED ORDER — ONDANSETRON HCL 4 MG/2ML IJ SOLN
4.0000 mg | Freq: Four times a day (QID) | INTRAMUSCULAR | Status: DC | PRN
Start: 2021-12-13 — End: 2021-12-14

## 2021-12-13 MED ORDER — ALBUTEROL SULFATE (2.5 MG/3ML) 0.083% IN NEBU: 2.5000 mg | INHALATION_SOLUTION | Freq: Four times a day (QID) | RESPIRATORY_TRACT | Status: DC | PRN

## 2021-12-13 MED ORDER — CORAL CALCIUM 1000 (390 CA) MG PO TABS: 1000.0000 mg | ORAL_TABLET | ORAL | Status: DC

## 2021-12-13 MED ORDER — ASCORBIC ACID 500 MG PO TABS
500.0000 mg | ORAL_TABLET | ORAL | Status: DC
  Administered 2021-12-13: 500 mg via ORAL
  Filled 2021-12-13: qty 1

## 2021-12-13 MED ORDER — HYDRALAZINE HCL 20 MG/ML IJ SOLN: 10.0000 mg | INTRAMUSCULAR | Status: DC | PRN

## 2021-12-13 MED ORDER — HEPARIN (PORCINE) IN NACL 1000-0.9 UT/500ML-% IV SOLN
INTRAVENOUS | Status: DC | PRN
  Administered 2021-12-13: 500 mL

## 2021-12-13 MED ORDER — AMLODIPINE BESYLATE 5 MG PO TABS
5.0000 mg | ORAL_TABLET | Freq: Every day | ORAL | Status: DC
  Administered 2021-12-13 – 2021-12-14 (×2): 5 mg via ORAL
  Filled 2021-12-13: qty 1

## 2021-12-13 MED ORDER — BISMUTH SUBSALICYLATE 262 MG/15ML PO SUSP
30.0000 mL | Freq: Four times a day (QID) | ORAL | Status: DC | PRN
  Filled 2021-12-13: qty 236

## 2021-12-13 MED ORDER — ATORVASTATIN CALCIUM 40 MG PO TABS
40.0000 mg | ORAL_TABLET | Freq: Every day | ORAL | Status: DC
  Administered 2021-12-13: 40 mg via ORAL
  Filled 2021-12-13 (×2): qty 1

## 2021-12-13 MED ORDER — LORATADINE 10 MG PO TABS
10.0000 mg | ORAL_TABLET | Freq: Every day | ORAL | Status: DC
  Administered 2021-12-14: 10 mg via ORAL
  Filled 2021-12-13: qty 1

## 2021-12-13 MED ORDER — VANCOMYCIN HCL IN DEXTROSE 1-5 GM/200ML-% IV SOLN
1000.0000 mg | INTRAVENOUS | Status: AC
  Administered 2021-12-13: 1000 mg via INTRAVENOUS

## 2021-12-13 MED ORDER — OLMESARTAN MEDOXOMIL-HCTZ 40-12.5 MG PO TABS: 1.0000 | ORAL_TABLET | Freq: Every day | ORAL | Status: DC

## 2021-12-13 MED ORDER — SODIUM CHLORIDE 0.9 % IV SOLN: INTRAVENOUS | Status: DC

## 2021-12-13 MED ORDER — PNEUMOCOCCAL 20-VAL CONJ VACC 0.5 ML IM SUSY
0.5000 mL | PREFILLED_SYRINGE | INTRAMUSCULAR | Status: AC
  Administered 2021-12-14: 0.5 mL via INTRAMUSCULAR
  Filled 2021-12-13: qty 0.5

## 2021-12-13 MED ORDER — HYDRALAZINE HCL 20 MG/ML IJ SOLN
10.0000 mg | Freq: Once | INTRAMUSCULAR | Status: AC
  Administered 2021-12-13: 10 mg via INTRAVENOUS

## 2021-12-13 MED ORDER — FENTANYL CITRATE (PF) 100 MCG/2ML IJ SOLN
INTRAMUSCULAR | Status: AC
  Filled 2021-12-13: qty 2

## 2021-12-13 MED ORDER — LIDOCAINE HCL 1 % IJ SOLN
INTRAMUSCULAR | Status: AC
Start: 2021-12-13 — End: ?
  Filled 2021-12-13: qty 60

## 2021-12-13 MED ORDER — ADULT MULTIVITAMIN W/MINERALS CH
1.0000 | ORAL_TABLET | ORAL | Status: DC
  Administered 2021-12-13: 1 via ORAL
  Filled 2021-12-13 (×2): qty 1

## 2021-12-13 MED ORDER — IRBESARTAN 150 MG PO TABS
300.0000 mg | ORAL_TABLET | Freq: Every day | ORAL | Status: DC
  Administered 2021-12-13 – 2021-12-14 (×2): 300 mg via ORAL
  Filled 2021-12-13 (×2): qty 2

## 2021-12-13 MED ORDER — SODIUM CHLORIDE 0.9 % IV SOLN
80.0000 mg | INTRAVENOUS | Status: AC
  Administered 2021-12-13: 80 mg

## 2021-12-13 MED ORDER — AMLODIPINE BESYLATE 5 MG PO TABS
ORAL_TABLET | ORAL | Status: AC
Start: 2021-12-13 — End: ?
  Filled 2021-12-13: qty 1

## 2021-12-13 MED ORDER — HYDRALAZINE HCL 20 MG/ML IJ SOLN
INTRAMUSCULAR | Status: AC
  Filled 2021-12-13: qty 1

## 2021-12-13 MED ORDER — FLUTICASONE PROPIONATE 50 MCG/ACT NA SUSP
1.0000 | Freq: Every day | NASAL | Status: DC | PRN
Start: 2021-12-13 — End: 2021-12-14
  Filled 2021-12-13: qty 16

## 2021-12-13 MED ORDER — HYDROCHLOROTHIAZIDE 12.5 MG PO TABS
12.5000 mg | ORAL_TABLET | Freq: Every day | ORAL | Status: DC
  Administered 2021-12-13 – 2021-12-14 (×2): 12.5 mg via ORAL
  Filled 2021-12-13 (×2): qty 1

## 2021-12-13 MED ORDER — FLUTICASONE FUROATE-VILANTEROL 100-25 MCG/ACT IN AEPB
1.0000 | INHALATION_SPRAY | Freq: Every day | RESPIRATORY_TRACT | Status: DC
  Administered 2021-12-14: 1 via RESPIRATORY_TRACT
  Filled 2021-12-13: qty 28

## 2021-12-13 MED ORDER — ASPIRIN 81 MG PO TBEC
81.0000 mg | DELAYED_RELEASE_TABLET | Freq: Every day | ORAL | Status: DC
  Administered 2021-12-13 – 2021-12-14 (×2): 81 mg via ORAL
  Filled 2021-12-13 (×3): qty 1

## 2021-12-13 MED ORDER — UMECLIDINIUM BROMIDE 62.5 MCG/ACT IN AEPB
1.0000 | INHALATION_SPRAY | Freq: Every day | RESPIRATORY_TRACT | Status: DC
  Administered 2021-12-14: 1 via RESPIRATORY_TRACT
  Filled 2021-12-13: qty 7

## 2021-12-13 MED ORDER — LIDOCAINE HCL (PF) 1 % IJ SOLN
INTRAMUSCULAR | Status: DC | PRN
  Administered 2021-12-13: 60 mL

## 2021-12-13 MED ORDER — ACETAMINOPHEN 325 MG PO TABS
325.0000 mg | ORAL_TABLET | ORAL | Status: DC | PRN
  Administered 2021-12-14: 650 mg via ORAL
  Filled 2021-12-13 (×2): qty 2

## 2021-12-13 MED ORDER — SODIUM CHLORIDE 0.9 % IV SOLN
INTRAVENOUS | Status: AC
  Filled 2021-12-13: qty 2

## 2021-12-13 SURGICAL SUPPLY — 15 items
CABLE SURGICAL S-101-97-12 (CABLE) ×2 IMPLANT
CATH RIGHTSITE C315HIS02 (CATHETERS) ×1 IMPLANT
IPG PACE AZUR XT DR MRI W1DR01 (Pacemaker) IMPLANT
KIT MICROPUNCTURE NIT STIFF (SHEATH) ×1 IMPLANT
LEAD CAPSURE NOVUS 5076-52CM (Lead) ×1 IMPLANT
LEAD SELECT SECURE 3830 383069 (Lead) IMPLANT
PACE AZURE XT DR MRI W1DR01 (Pacemaker) ×2 IMPLANT
PAD DEFIB RADIO PHYSIO CONN (PAD) ×2 IMPLANT
SELECT SECURE 3830 383069 (Lead) ×2 IMPLANT
SHEATH 7FR PRELUDE SNAP 13 (SHEATH) ×2 IMPLANT
SHEATH 7FR PRELUDE SNAP 25 (SHEATH) ×1 IMPLANT
SHEATH PROBE COVER 6X72 (BAG) ×1 IMPLANT
SLITTER 6232ADJ (MISCELLANEOUS) ×1 IMPLANT
TRAY PACEMAKER INSERTION (PACKS) ×2 IMPLANT
WIRE HI TORQ VERSACORE-J 145CM (WIRE) ×1 IMPLANT

## 2021-12-13 NOTE — Progress Notes (Signed)
Spoke with Dr. Curt Bears regarding pt's BP, PRN meds to be ordered by Dr. Curt Bears, ok to take pt to [place], safety maintained

## 2021-12-13 NOTE — Discharge Instructions (Signed)
After Your Pacemaker   You have a [employer]onic Pacemaker  ACTIVITY Do not lift your arm above shoulder height for 1 week after your procedure. After 7 days, you may progress as below.  You should remove your sling 24 hours after your procedure, unless otherwise instructed by your provider.     Wednesday December 20, 2021  Thursday December 21, 2021 Friday December 22, 2021 Saturday December 23, 2021   Do not lift, push, pull, or carry anything over 10 pounds with the affected arm until 6 weeks (Wednesday January 24, 2022 ) after your procedure.   You may drive AFTER your wound check, unless you have been told otherwise by your provider.   Ask your healthcare provider when you can go back to work   INCISION/Dressing If you are on a blood thinner such as Coumadin, Xarelto, Eliquis, Plavix, or Pradaxa please confirm with your provider when this should be resumed.   If large square, outer bandage is left in place, this can be removed after 24 hours from your procedure. Do not remove steri-strips or glue as below.   Monitor your Pacemaker site for redness, swelling, and drainage. Call the device clinic at [phone] if you experience these symptoms or fever/chills.  If your incision is sealed with Steri-strips or staples, you may shower 7 days after your procedure or when told by your provider. Do not remove the steri-strips or let the shower hit directly on your site. You may wash around your site with soap and water.    If you were discharged in a sling, please do not wear this during the day more than 48 hours after your surgery unless otherwise instructed. This may increase the risk of stiffness and soreness in your shoulder.   Avoid lotions, ointments, or perfumes over your incision until it is well-healed.  You may use a hot tub or a pool AFTER your wound check appointment if the incision is completely closed.  Pacemaker Alerts:  Some alerts are vibratory and others beep. These are NOT  emergencies. Please call our office to let us know. If this occurs at night or on weekends, it can wait until the next business day. Send a remote transmission.  If your device is capable of reading fluid status (for heart failure), you will be offered monthly monitoring to review this with you.   DEVICE MANAGEMENT Remote monitoring is used to monitor your pacemaker from home. This monitoring is scheduled every 91 days by our office. It allows [place] to keep an eye on the functioning of your device to ensure it is working properly. You will routinely see your Electrophysiologist annually (more often if necessary).   You should receive your ID card for your new device in 4-8 weeks. Keep this card with you at all times once received. Consider wearing a medical alert bracelet or necklace.  Your Pacemaker may be MRI compatible. This will be discussed at your next office visit/wound check.  You should avoid contact with strong electric or magnetic fields.   Do not use amateur (ham) radio equipment or electric (arc) welding torches. MP3 player headphones with magnets should not be used. Some devices are safe to use if held at least 12 inches (30 cm) from your Pacemaker. These include power tools, lawn mowers, and speakers. If you are unsure if something is safe to use, ask your health care provider.  When using your cell phone, hold it to the ear that is on the opposite side from the  Pacemaker. Do not leave your cell phone in a pocket over the Pacemaker.  You may safely use electric blankets, heating pads, computers, and microwave ovens.  Call the office right away if: You have chest pain. You feel more short of breath than you have felt before. You feel more light-headed than you have felt before. Your incision starts to open up.  This information is not intended to replace advice given to you by your health care provider. Make sure you discuss any questions you have with your health care provider.

## 2021-12-13 NOTE — Progress Notes (Addendum)
Spoke with Dr. Curt Bears regarding pt's increased BP, order obtained, See MAR and flowsheets, ok to move pt to 6E if BP decreasing, safety maintained

## 2021-12-13 NOTE — Interval H&P Note (Signed)
History and Physical Interval Note:  I have seen and examined this patient with Yolanda Bauer.  Agree with above, note added to reflect my findings.  Patient with a history of complete AV block. Had washout of metoprolol with improved conduction and discharged from the hospital. Now with ECG showing 2:1 AV block on no AV nodal blockers. Feeling well with some fatigue and mild SOB.  GEN: Well nourished, well developed, in no acute distress  HEENT: normal  Neck: no JVD, carotid bruits, or masses Cardiac: bradycardic; no murmurs, rubs, or gallops,no edema  Respiratory:  clear to auscultation bilaterally, normal work of breathing GI: soft, nontender, nondistended, + BS MS: no deformity or atrophy  Skin: warm and dry Neuro:  Strength and sensation are intact Psych: euthymic mood, full affect   Second degree AV block: on no AV nodal blockers, Yolanda Bauer need pacemaker implant. Risks and benefits discussed. Risks include bleeding, tamponade, infection, pneumothorax, lead dislodgement. The patient  understands the risks and has agreed to the procedure.   Shayna Eblen M. Hamad Whyte MD 12/13/2021 3:25 PM

## 2021-12-14 ENCOUNTER — Ambulatory Visit (HOSPITAL_COMMUNITY): Payer: Medicare Other

## 2021-12-14 ENCOUNTER — Encounter (HOSPITAL_COMMUNITY): Payer: Self-pay | Admitting: Cardiology

## 2021-12-14 ENCOUNTER — Telehealth: Payer: Self-pay

## 2021-12-14 DIAGNOSIS — I441 Atrioventricular block, second degree: Secondary | ICD-10-CM

## 2021-12-14 DIAGNOSIS — Z85118 Personal history of other malignant neoplasm of bronchus and lung: Secondary | ICD-10-CM | POA: Diagnosis not present

## 2021-12-14 DIAGNOSIS — Z9981 Dependence on supplemental oxygen: Secondary | ICD-10-CM | POA: Diagnosis not present

## 2021-12-14 DIAGNOSIS — I10 Essential (primary) hypertension: Secondary | ICD-10-CM | POA: Diagnosis not present

## 2021-12-14 MED ORDER — ASPIRIN 81 MG PO TABS: 81.0000 mg | ORAL_TABLET | Freq: Every day | ORAL | Status: AC

## 2021-12-14 MED FILL — Fentanyl Citrate Preservative Free (PF) Inj 100 MCG/2ML: INTRAMUSCULAR | Qty: 2 | Status: AC

## 2021-12-14 MED FILL — Midazolam HCl Inj 5 MG/5ML (Base Equivalent): INTRAMUSCULAR | Qty: 5 | Status: AC

## 2021-12-14 NOTE — Discharge Summary (Addendum)
ELECTROPHYSIOLOGY PROCEDURE DISCHARGE SUMMARY    Patient ID: Yolanda Bauer,  MRN: [id number], DOB/AGE: June 29, 1945 76 y.o.  Admit date: 12/13/2021 Discharge date: 12/14/2021  Primary Care Physician: Denny Levy, [place]  Primary Cardiologist: Rozann Lesches, MD  Electrophysiologist: Dr. 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  Implantation of a Medtronic Dual Chamber PPM on 12/13/2021 by Dr. Curt Bears. The patient received a Medtronic Azure XT DR [id number]  with a Medtronic CapSureFix Novus 5076 right atrial lead and a Medtronic SelectSure 3557 right ventricular lead. There were no immediate post procedure complications.   2.  CXR on 12/14/21 demonstrated no pneumothorax status post device implantation.    Brief HPI: Yolanda Bauer is a 76 y.o. female was previously admitted for advanced AV block that improved as she washed out of beta blocker.  Pt had recurrent advanced AV block off BB and was recommended for consideration of PPM implantation.  Past medical history includes above.  The patient has had AV block without reversible causes identified.  Risks, benefits, and alternatives to PPM implantation were reviewed with the patient who wished to proceed.   Hospital Course:  The patient was admitted and underwent implantation of a Medtronic dual chamber PPM with details as outlined above.  She was monitored on telemetry overnight which demonstrated appropriate pacing.  Left chest was without hematoma or ecchymosis.  The device was interrogated and found to be functioning normally.  CXR was obtained and demonstrated no pneumothorax status post device implantation.  Wound care, arm mobility, and restrictions were reviewed with the patient.  The patient was examined and considered stable for  discharge to home.    Anticoagulation resumption This patient is not on anticoagulation. ASA held until next week.      Physical Exam: Vitals:   12/13/21 1754 12/13/21 1810 12/13/21 1947 12/14/21 0410  BP: (!) 172/60 (!) 163/57 (!) 142/62 134/63  Pulse: 82 87 85 75  Resp: 17 16 17 18   Temp:  98.1 F (36.7 C) 97.6 F (36.4 C) 97.9 F (36.6 C)  TempSrc:  Oral Oral Oral  SpO2: 99% 98% 98% 94%  Weight:      Height:        GEN- The patient is well appearing, alert and oriented x 3 today.   HEENT: normocephalic, atraumatic; sclera clear, conjunctiva pink; hearing intact; oropharynx clear; neck supple, no JVP Lymph- no cervical lymphadenopathy Lungs- Clear to ausculation bilaterally, normal work of breathing.  No wheezes, rales, rhonchi Heart- Regular rate and rhythm, no murmurs, rubs or gallops, PMI not laterally displaced GI- soft, non-tender, non-distended, bowel sounds present, no hepatosplenomegaly Extremities- no clubbing, cyanosis, or edema; DP/PT/radial pulses 2+ bilaterally MS- no significant deformity or atrophy Skin- warm and dry, no rash or lesion, left chest without hematoma/ecchymosis Psych- euthymic mood, full affect Neuro- strength and sensation are intact   Labs:   Lab Results  Component Value Date   WBC 7.0 11/27/2021   HGB 12.3 11/27/2021   HCT 37.4 11/27/2021   MCV 89.3 11/27/2021   PLT 270 11/27/2021   No results for input(s): "NA", "K", "CL", "CO2", "BUN", "CREATININE", "CALCIUM", "PROT", "BILITOT", "ALKPHOS", "ALT", "AST", "GLUCOSE" in the last 168 hours.  Invalid input(s): "LABALBU"  Discharge Medications:  Allergies as of 12/14/2021  Reactions   Penicillins Hives        Medication List     TAKE these medications    acetaminophen 325 MG tablet Commonly known as: TYLENOL Take 650 mg by mouth every 6 (six) hours as needed for moderate pain.   albuterol 108 (90 Base) MCG/ACT inhaler Commonly known as: VENTOLIN HFA Inhale 2 puffs  into the lungs every 6 (six) hours as needed for wheezing.   amLODipine 5 MG tablet Commonly known as: NORVASC Take 1 tablet (5 mg total) by mouth daily.   ascorbic acid 500 MG tablet Commonly known as: VITAMIN C Take 500 mg by mouth every other day.   aspirin 81 MG tablet Take 1 tablet (81 mg total) by mouth daily. Start taking on: December 18, 2021 What changed: These instructions start on December 18, 2021. If you are unsure what to do until then, ask your doctor or other care provider.   atorvastatin 40 MG tablet Commonly known as: LIPITOR Take 40 mg by mouth daily.   bismuth subsalicylate 937 DS/28JG suspension Commonly known as: PEPTO BISMOL Take 30 mLs by mouth every 6 (six) hours as needed for indigestion.   Coral Calcium 1000 (390 Ca) MG Tabs Take 1,000 mg by mouth every other day.   DRY EYE RELIEF DROPS OP Place 1 drop into both eyes every other day.   fluticasone 50 MCG/ACT nasal spray Commonly known as: FLONASE Place 1 spray into both nostrils daily as needed for allergies.   guaiFENesin 600 MG 12 hr tablet Commonly known as: MUCINEX Take 600 mg by mouth as needed for to loosen phlegm.   ICY HOT EX Apply 1 application topically daily as needed (muscle pain).   loratadine 10 MG tablet Commonly known as: CLARITIN Take 10 mg by mouth daily.   multivitamin tablet Take 1 tablet by mouth every other day.   olmesartan-hydrochlorothiazide 40-12.5 MG tablet Commonly known as: BENICAR HCT Take 1 tablet by mouth daily.   spironolactone 25 MG tablet Commonly known as: ALDACTONE Take 25 mg by mouth 2 (two) times daily.   Trelegy Ellipta 100-62.5-25 MCG/ACT Aepb Generic drug: Fluticasone-Umeclidin-Vilant USE 1 INHALATION BY MOUTH  DAILY What changed: See the new instructions.        Disposition:    Follow-up Information     [facility] Follow up.   Why: on 8/23 at 4 pm for post pacemaker check Contact  information: [place] [address] [phone]                Duration of Discharge Encounter: Greater than 30 minutes including physician time.  Signed, Shirley Friar, PA-C  12/14/2021 7:17 AM  I have seen and examined this patient with Oda Kilts.  Agree with above, note added to reflect my findings.  On exam, RRR, no murmurs, lungs clear.  She is now status post Medtronic dual-chamber pacemaker for second-degree AV block.  Device functioning appropriately.  Chest x-ray and interrogation without issue.  Plan for discharge today with follow-up in device clinic.  Eual Lindstrom M. Sonya Pucci MD 12/15/2021 7:02 AM

## 2021-12-14 NOTE — Telephone Encounter (Signed)
-----   Message from Shirley Friar, PA-C sent at 12/13/2021  2:04 PM EDT ----- Same day PPM WC 8/9

## 2021-12-15 ENCOUNTER — Telehealth: Payer: Self-pay | Admitting: Internal Medicine

## 2021-12-15 DIAGNOSIS — J9611 Chronic respiratory failure with hypoxia: Secondary | ICD-10-CM

## 2021-12-15 NOTE — Telephone Encounter (Signed)
Patient states that she a pacemaker put in on 12/13/21 and wants to know if Dr. Melvyn Novas still wants her to continue nocturnal oxygen. She states she is using 2LO2.   Dr. Melvyn Novas please advise.

## 2021-12-15 NOTE — Telephone Encounter (Signed)
Yolanda Bauer/Kem,   Can you reach out to patient and help her with her Carelink App.    The app is set up but states that it is pending authorization on our end.    I have added her to [place] and set up her Remote schedule in Epic. But, wasn't sure how to connect her Epic appts with her Carelink.   Can you help with that and make sure she is transmitting?    Thanks for any help you can give.

## 2021-12-15 NOTE — Telephone Encounter (Signed)
If wants to stop noct 02 best to do ONO RA first to see if that's safe

## 2021-12-15 NOTE — Telephone Encounter (Signed)
Transmission received 12/14/2021.

## 2021-12-15 NOTE — Telephone Encounter (Signed)
Follow-up after same day discharge: Implant date: 12/13/2021  MD: Allegra Lai, MD Device: Medtronic Azure PPM  Location: Left Chest    Patient reminded of wound check visit: scheduled for 12/27/21 90 day MD follow-up: 03/21/2022  Remote Transmission received: Patient is connected via app but is waiting for authorization on our end.  Will have CMA reach out to her and walk her through the connection process.   Dressing/sling removed: Patient has removed the outer dressing only.  Aware of shower/bathing restrictions until wound check appointment.  No pain or bleeding at site steri strips are still in place. She has removed the sling and reminded of lift, pull, push restrictions for that arm.    Confirmed with patient that she is aware  to resume taking her baby ASA on August 14th.   Patient is doing well without any pain or discomfort.  She has our direct contact number in device if any questions or concerns.  No further questions at this point.

## 2021-12-15 NOTE — Telephone Encounter (Signed)
Called and spoke to patient and went over recommendations from Dr Melvyn Novas. Patient agreed to doing test to see if we can get her off of the oxygen at night time. Order placed for ONO on room air. Nothing further needed

## 2021-12-27 ENCOUNTER — Ambulatory Visit (INDEPENDENT_AMBULATORY_CARE_PROVIDER_SITE_OTHER): Payer: Medicare Other

## 2021-12-27 DIAGNOSIS — I442 Atrioventricular block, complete: Secondary | ICD-10-CM | POA: Diagnosis not present

## 2021-12-27 NOTE — Patient Instructions (Signed)
   After Your Pacemaker   Monitor your pacemaker site for redness, swelling, and drainage. Call the device clinic at [phone] if you experience these symptoms or fever/chills.  Your incision was closed with Steri-strips or staples:  You may shower 7 days after your procedure and wash your incision with soap and water. Avoid lotions, ointments, or perfumes over your incision until it is well-healed.  You may use a hot tub or a pool after your wound check appointment if the incision is completely closed.  Do not lift, push or pull greater than 10 pounds with the affected arm until 6 weeks after your procedure. There are no other restrictions in arm movement after your wound check appointment. UNTIL AFTER SEPTEMBER 21ST.   You may drive, unless driving has been restricted by your healthcare providers.   Remote monitoring is used to monitor your pacemaker from home. This monitoring is scheduled every 91 days by our office. It allows [place] to keep an eye on the functioning of your device to ensure it is working properly. You will routinely see your Electrophysiologist annually (more often if necessary).

## 2021-12-28 LAB — CUP PACEART INCLINIC DEVICE CHECK
Battery Remaining Longevity: 151 mo
Battery Voltage: 3.23 V
Brady Statistic AP VP Percent: 0.7 %
Brady Statistic AP VS Percent: 0.66 %
Brady Statistic AS VP Percent: 65.26 %
Brady Statistic AS VS Percent: 33.37 %
Brady Statistic RA Percent Paced: 1.42 %
Brady Statistic RV Percent Paced: 65.96 %
Date Time Interrogation Session: 20230823161100
Implantable Lead Implant Date: 20230809
Implantable Lead Implant Date: 20230809
Implantable Lead Location: 753859
Implantable Lead Location: 753860
Implantable Lead Model: 3830
Implantable Lead Model: 5076
Implantable Pulse Generator Implant Date: 20230809
Lead Channel Impedance Value: 342 Ohm
Lead Channel Impedance Value: 418 Ohm
Lead Channel Impedance Value: 418 Ohm
Lead Channel Impedance Value: 570 Ohm
Lead Channel Pacing Threshold Amplitude: 0.375 V
Lead Channel Pacing Threshold Amplitude: 0.625 V
Lead Channel Pacing Threshold Pulse Width: 0.4 ms
Lead Channel Pacing Threshold Pulse Width: 0.4 ms
Lead Channel Sensing Intrinsic Amplitude: 4.125 mV
Lead Channel Sensing Intrinsic Amplitude: 4.875 mV
Lead Channel Sensing Intrinsic Amplitude: 4.875 mV
Lead Channel Sensing Intrinsic Amplitude: 5 mV
Lead Channel Setting Pacing Amplitude: 3.5 V
Lead Channel Setting Pacing Amplitude: 3.5 V
Lead Channel Setting Pacing Pulse Width: 0.4 ms
Lead Channel Setting Sensing Sensitivity: 1.2 mV

## 2021-12-28 NOTE — Progress Notes (Signed)
Pacemaker check in clinic. Normal device function. Thresholds, sensing, impedances consistent with previous measurements. Device programmed to maximize longevity. No mode switch or high ventricular rates noted. Device programmed at appropriate safety margins. Histogram distribution appropriate for patient activity level. Device programmed to optimize intrinsic conduction. Estimated longevity 12 years. Patient enrolled in remote follow-up. Patient education completed.

## 2021-12-29 ENCOUNTER — Telehealth: Payer: Self-pay | Admitting: Cardiology

## 2021-12-29 NOTE — Telephone Encounter (Signed)
Janett Billow from [facility] called wanting to know if pt needs to be premedicated before dental cleaning. She states she was a little worried about infection because pt had a pacemaker placed recently.

## 2021-12-29 NOTE — Telephone Encounter (Signed)
   Patient Name: Yolanda Bauer  DOB: November 18, 1945 MRN: [id number]  Primary Cardiologist: Rozann Lesches, MD  Chart reviewed as part of pre-operative protocol coverage.   IF SIMPLE EXTRACTION/CLEANINGS: Simple dental extractions (i.e. 1-2 teeth) are considered low risk procedures per guidelines and generally do not require any specific cardiac clearance. It is also generally accepted that for simple extractions and dental cleanings, there is no need to interrupt blood thinner therapy.   SBE prophylaxis is not required for the patient from a cardiac standpoint.    Please call with questions.  Elgie Collard, PA-C 12/29/2021, 11:21 AM

## 2022-01-01 ENCOUNTER — Telehealth: Payer: Self-pay | Admitting: Internal Medicine

## 2022-01-01 DIAGNOSIS — J9611 Chronic respiratory failure with hypoxia: Secondary | ICD-10-CM

## 2022-01-01 NOTE — Telephone Encounter (Signed)
ONO on RA done by [employer] on 12/21/21- pos for desat  Per MW- begin 2lpm o2 with sleep and repeat ONO on 2lpm   Pt notified of results. She states she already uses o2 2lpm with sleep  I advised she needs to continue on this and also have ONO repeated on the 2lpm  She verbalized understanding  ONO 2lpm ordered

## 2022-03-12 ENCOUNTER — Encounter: Payer: Self-pay | Admitting: Internal Medicine

## 2022-03-12 ENCOUNTER — Ambulatory Visit: Payer: Medicare Other | Admitting: Internal Medicine

## 2022-03-12 VITALS — BP 132/68 | HR 82 | Temp 98.4°F | Ht 61.0 in | Wt 181.6 lb

## 2022-03-12 DIAGNOSIS — J9611 Chronic respiratory failure with hypoxia: Secondary | ICD-10-CM

## 2022-03-12 DIAGNOSIS — J9612 Chronic respiratory failure with hypercapnia: Secondary | ICD-10-CM | POA: Diagnosis not present

## 2022-03-12 DIAGNOSIS — J449 Chronic obstructive pulmonary disease, unspecified: Secondary | ICD-10-CM | POA: Diagnosis not present

## 2022-03-12 MED ORDER — ALBUTEROL SULFATE HFA 108 (90 BASE) MCG/ACT IN AERS: 2.0000 | INHALATION_SPRAY | Freq: Four times a day (QID) | RESPIRATORY_TRACT | 6 refills | Status: DC | PRN

## 2022-03-12 NOTE — Patient Instructions (Signed)
To get the most out of exercise, you need to be continuously aware that you are short of breath, but never out of breath, for at least 30 minutes daily. As you improve, it will actually be easier for you to do the same amount of exercise  in  30 minutes so always push to the level where you are short of breath.     Make sure you check your oxygen saturations at highest level of activity   Please schedule a follow up visit in 6 months but call sooner if needed with pfts on return

## 2022-03-12 NOTE — Assessment & Plan Note (Addendum)
As of surgery 04/11/20 02 2lpm  07/28/2020   Walked RA  approx   400 ft  @ mod fast pace  stopped due to  Sob/ sats 90%    -  09/06/2020   Walked RA  approx   400 ft  @ mod pace  stopped due to min sob with sats 90%  - 03/06/2021   Walked on RA x  3  lap(s) =  approx 450 @ moderate pace, stopped due to end of study, sob @ 2nd lap  with lowest 02 sats 92% - [facility]  05/12/21     32   - ONO 12/21/21 RA  desat < 89% x 1h 20m > 12/29/2021 try 2lpm and repeat on 2lpm   - [facility]  11/29/21  = 27  - 03/12/2022   Walked on RA  x  3  lap(s) =  approx 450  ft  @ moderate  pace, stopped due to end of study  with lowest 02 sats 92% and no sob    Clearly hypercarbic component better and no need for portable 02 but I would like to see her become much more active over time so rec sub max ex/ check sats at peak ex and build to 30 min per day.  Each maintenance medication was reviewed in detail including emphasizing most importantly the difference between maintenance and prns and under what circumstances the prns are to be triggered using an action plan format where appropriate.  Total time for H and P, chart review, counseling, reviewing hfa/02 device(s) , directly observing portions of ambulatory 02 saturation study/ and generating customized AVS unique to this office visit / same day charting > 30 min

## 2022-03-12 NOTE — Assessment & Plan Note (Signed)
Quit smoking 03/15/2020 - PFT's  03/15/20  FEV1 0.90 (44 % ) ratio 0.54  p 3 % improvement from saba p 0 prior to study with DLCO  12.22 (84%) corrects to 3.59 (85%)  for alv volume and FV curve classic concave contour on exp portion   - 07/28/2020  After extensive coaching inhaler device,  effectiveness =    0 with hfa - 07/28/2020  After extensive coaching inhaler device,  effectiveness =    75% with elipta with short ti > try trelegy > improved 09/06/2020     Group D (now reclassified as E) in terms of symptom/risk and laba/lama/ICS  therefore appropriate rx at this point >>>  trelegy approp along with now approp saba rx   F/u in 6 m with pfts (post op baseline needed ) on return

## 2022-03-12 NOTE — Progress Notes (Signed)
Yolanda Bauer, female    DOB: Nov 29, 1945    MRN: [id number]   Brief patient profile:  89  yobf quit smoking 03/2020 with hbp on ACEi with noct "wheeze" on prn albuterol helped some referred to pulmonary clinic in [place]  01/27/2020 by Dr Denny Levy PA re SPN  LLL sup segment.    History of Present Illness  01/27/2020  Pulmonary/ 1st office eval/Jahn Franchini  Chief Complaint  Patient presents with   Consult    productive cough with white phlegm in the mornings  Dyspnea:  No steps at her home but Not limited by breathing from desired activities   Cough: some am's / no hemoptysis/ some "wheeze at hs  Sleep: bed is flat/ one pillow  SABA use: none 02 none   rec Stop lisinopril and instead take olmesartan 20- 12.5 one daily  The key is to stop smoking completely before smoking completely stops you!     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     07/28/2020  f/u ov/[place] office/Lorain Fettes re: s/p L sup  Segmentectomy  Chief Complaint  Patient presents with   Follow-up    Patient states has "shortness of breath sometimes but not as much as she used to"   Essential hypertension D/c acei 01/27/2020 due to report of noct "wheeze"   Dyspnea: able to shop [employer] sometimes s 02 /can't name one activity  Cough: nasal congestion  Sleeping: R side down/ bed is flat/ 2 pillows  SABA use: saba rarely  02: 2 lpm  Covid status: vax x 3 Lung cancer screening: n/a  rec Plan A = Automatic = Always=    Trelegy 100 each am  Work on inhaler technique:   Plan B = Backup (to supplement plan A, not to replace it) Only use your albuterol inhaler as a rescue medication  Continue 02 is 2lpm at bedtime and then during the day keep over  90%  Make sure you check your oxygen saturation  at your highest level of activity  to be sure it stays over 90%      03/06/2021  f/u ov/[place] office/Raymont Andreoni re: gold 3 copd  maint on trelegy    Chief Complaint  Patient presents with   Follow-up    2L O2 cont. At home. Uses trelegy inhaler and requesting refill on albuterol inhaler. Pt reports SOB has improved since last ov.   Dyspnea:  [employer] pushing the cart, [place] walk short of breath sats 92% s 02 per pt  Cough: not much at all  Sleeping: flat bed, 2 pillows  SABA use: 2-3 x per week 02: 2lpms hs / prn daytime  Covid status: vax x 3  Rec To get the most out of exercise, you need to be continuously aware that you are short of breath, but never out of breath, for at least 30 minutes daily Make sure your 0xygen level does not drop below 90% at peak exercise         09/11/2021  f/u ov/[place] office/Jaquell Seddon re: GOLD 3 copd / hypoxemic and hypercarbic maint on Trelegy   Chief Complaint  Patient presents with   Follow-up    Using 2LO2 at night time. Feels breathing is good some days and bad some days   Dyspnea:  MMRC1 = can walk nl pace, flat grade, can't hurry or go uphills or steps s sob   -  only if overdoes it  Cough: none  Sleeping: flat bed 2 pillows SABA use: 2-3 x per week 02: 2lpm hs / not wearing prn daytime  Rec Plan A = Automatic = Always=    Trelegy one click 1st thing in am - take two good drags then rinse and gargle  Plan B = Backup (to supplement plan A, not to replace it) Only use your albuterol inhaler as a rescue medication  Work on inhaler technique:   Please schedule a follow up visit in 6 months but call sooner if needed - first thing in AM and don't take any inhalers that am    Pacemaker Dec 13 2021 cone for CHB > doe improved  03/12/2022  f/u ov/[place] office/Florentino Laabs re: GOLD 3/ hypoxemic and hypercarbic  maint on trelegy   Chief Complaint  Patient presents with   Follow-up    Breathing has improved   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   Sleeping  without nocturnal  or early am exacerbation  of respiratory  c/o's or need for noct saba. Also denies any obvious fluctuation of symptoms with weather or environmental changes or other aggravating or alleviating factors except as outlined above   No unusual exposure hx or h/o childhood pna/ asthma or knowledge of premature birth.  Current Allergies, Complete Past Medical History, Past Surgical History, Family History, and Social History were reviewed in [employer] record.  ROS  The following are not active complaints unless bolded Hoarseness, sore throat, dysphagia, dental problems, itching, sneezing,  nasal congestion or discharge of excess mucus or purulent secretions, ear ache,   fever, chills, sweats, unintended wt loss or wt gain, classically pleuritic or exertional cp,  orthopnea pnd or arm/hand swelling  or leg swelling, presyncope, palpitations, abdominal pain, anorexia, nausea, vomiting, diarrhea  or change in bowel habits or change in bladder habits, change in stools or change in urine, dysuria, hematuria,  rash, arthralgias, visual complaints, headache, numbness, weakness or ataxia or problems with walking or coordination,  change in mood or  memory.        Current Meds  Medication Sig   acetaminophen (TYLENOL) 325 MG tablet Take 650 mg by mouth every 6 (six) hours as needed for moderate pain.   albuterol (VENTOLIN HFA) 108 (90 Base) MCG/ACT inhaler Inhale 2 puffs into the lungs every 6 (six) hours as needed for wheezing.   amLODipine (NORVASC) 5 MG tablet Take 1 tablet (5 mg total) by mouth daily.   Ascorbic Acid (VITAMIN C) 500 MG tablet Take 500 mg by mouth every other day.   aspirin 81 MG tablet Take 1 tablet (81 mg total) by mouth daily.    atorvastatin (LIPITOR) 40 MG tablet Take 40 mg by mouth daily.   bismuth subsalicylate (PEPTO BISMOL) 262 MG/15ML suspension Take 30 mLs by mouth every 6 (six) hours as needed for indigestion.   Coral Calcium 1000 (390 CA) MG TABS Take 1,000 mg by mouth every other day.   fluticasone (FLONASE) 50 MCG/ACT nasal spray Place 1 spray into both nostrils daily as needed for allergies.    Glycerin-Hypromellose-PEG 400 (DRY EYE RELIEF  DROPS OP) Place 1 drop into both eyes every other day.   guaiFENesin (MUCINEX) 600 MG 12 hr tablet Take 600 mg by mouth as needed for to loosen phlegm.   loratadine (CLARITIN) 10 MG tablet Take 10 mg by mouth daily.   Menthol, Topical Analgesic, (ICY HOT EX) Apply 1 application topically daily as needed (muscle pain).   Multiple Vitamin (MULTIVITAMIN) tablet Take 1 tablet by mouth every other day.   olmesartan-hydrochlorothiazide (BENICAR HCT) 40-12.5 MG tablet Take 1 tablet by mouth daily.   spironolactone (ALDACTONE) 25 MG tablet Take 25 mg by mouth 2 (two) times daily.   TRELEGY ELLIPTA 100-62.5-25 MCG/ACT AEPB USE 1 INHALATION BY MOUTH  DAILY (Patient taking differently: Take 1 Inhalation by mouth daily.)                 Past Medical History:  Diagnosis Date   Hyperlipidemia    Hypertension        Objective:     Wts  03/12/2022        181  09/11/2021          176  03/06/2021      180   09/06/2020          168  07/28/20 164 lb (74.4 kg)  07/19/20 160 lb (72.6 kg)  05/12/20 156 lb 14.4 oz (71.2 kg)  01/26/2010       148   Vital signs reviewed  03/12/2022  - Note at rest 02 sats  95% on RA   General appearance:    pleasant amb wf    HEENT : Oropharynx  clear   Nasal turbinates nl    NECK :  without  apparent JVD/ palpable Nodes/TM    LUNGS: no acc muscle use,  Min barrel  contour chest wall with bilateral  slightly decreased bs s audible wheeze and  without cough on insp or exp maneuvers and min  Hyperresonant  to  percussion bilaterally    CV:   RRR  no s3 or murmur or increase in P2, and no edema   ABD:  soft and nontender with pos end  insp Hoover's  in the supine position.  No bruits or organomegaly appreciated   MS:  Nl gait/ ext warm without deformities Or obvious joint restrictions  calf tenderness, cyanosis or clubbing     SKIN: warm and dry without lesions    NEURO:  alert, approp, nl sensorium with  no motor or cerebellar deficits apparent.             Assessment

## 2022-03-15 ENCOUNTER — Ambulatory Visit (INDEPENDENT_AMBULATORY_CARE_PROVIDER_SITE_OTHER): Payer: Medicare Other

## 2022-03-15 DIAGNOSIS — I442 Atrioventricular block, complete: Secondary | ICD-10-CM | POA: Diagnosis not present

## 2022-03-15 LAB — CUP PACEART REMOTE DEVICE CHECK
Battery Remaining Longevity: 161 mo
Battery Voltage: 3.22 V
Brady Statistic AP VP Percent: 1.59 %
Brady Statistic AP VS Percent: 2.23 %
Brady Statistic AS VP Percent: 60.68 %
Brady Statistic AS VS Percent: 35.5 %
Brady Statistic RA Percent Paced: 3.87 %
Brady Statistic RV Percent Paced: 62.27 %
Date Time Interrogation Session: 20231109042920
Implantable Lead Connection Status: 753985
Implantable Lead Connection Status: 753985
Implantable Lead Implant Date: 20230809
Implantable Lead Implant Date: 20230809
Implantable Lead Location: 753859
Implantable Lead Location: 753860
Implantable Lead Model: 3830
Implantable Lead Model: 5076
Implantable Pulse Generator Implant Date: 20230809
Lead Channel Impedance Value: 399 Ohm
Lead Channel Impedance Value: 399 Ohm
Lead Channel Impedance Value: 475 Ohm
Lead Channel Impedance Value: 532 Ohm
Lead Channel Pacing Threshold Amplitude: 0.375 V
Lead Channel Pacing Threshold Amplitude: 0.625 V
Lead Channel Pacing Threshold Pulse Width: 0.4 ms
Lead Channel Pacing Threshold Pulse Width: 0.4 ms
Lead Channel Sensing Intrinsic Amplitude: 3.25 mV
Lead Channel Sensing Intrinsic Amplitude: 3.25 mV
Lead Channel Sensing Intrinsic Amplitude: 8.25 mV
Lead Channel Sensing Intrinsic Amplitude: 8.25 mV
Lead Channel Setting Pacing Amplitude: 1.5 V
Lead Channel Setting Pacing Amplitude: 2 V
Lead Channel Setting Pacing Pulse Width: 0.4 ms
Lead Channel Setting Sensing Sensitivity: 1.2 mV
Zone Setting Status: 755011

## 2022-03-20 IMAGING — CT CT CHEST W/ CM
2 of 5 series · 15 of 36 positions shown, 18 images · IV contrast (OMNIPAQUE)
Comparison: PET-CT dated 02/15/2020.

CLINICAL DATA: Non-small cell lung cancer, diagnosed 0409, status
post left lower lobectomy. Shortness of breath.

EXAM:
CT CHEST WITH CONTRAST
TECHNIQUE: Multidetector CT imaging of the chest was performed during
intravenous contrast administration.
CONTRAST:  75mL OMNIPAQUE IOHEXOL 300 MG/ML  SOLN

[Series 2: axial st · axial · 0.68mm/px · z∈[-280,-16]mm · 12 of 156 slices shown, 15 images]
[im 12/156  mediastinal]
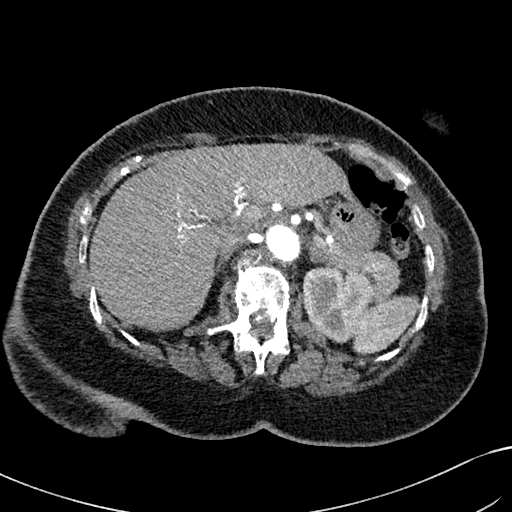
[im 12/156  lung]
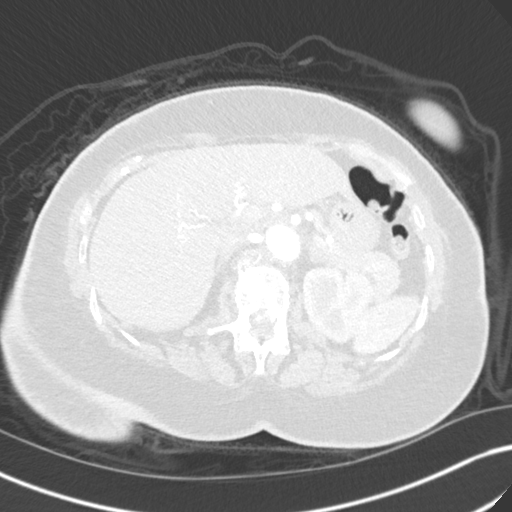
[im 24/156  lung]
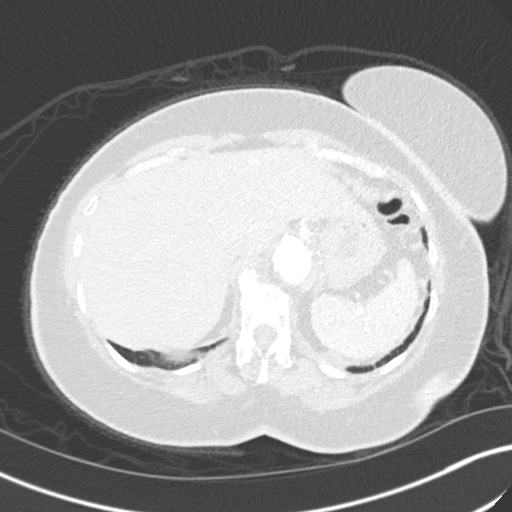
[im 36/156  lung]
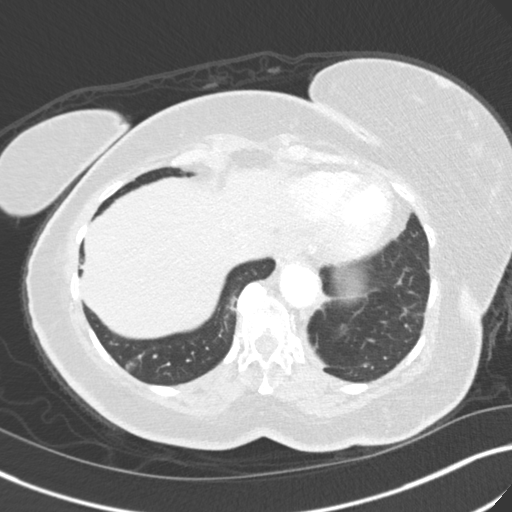
[im 48/156  lung]
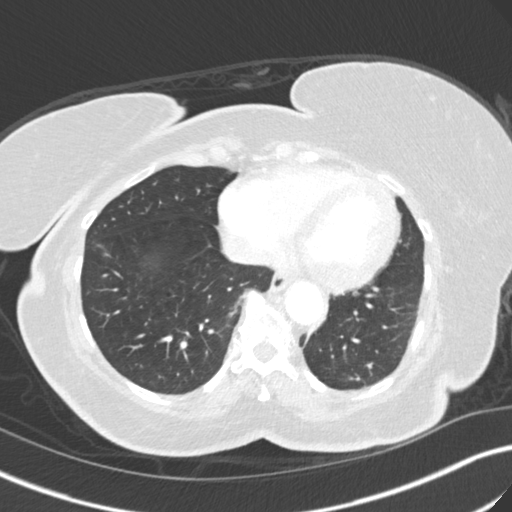
[im 60/156  mediastinal]
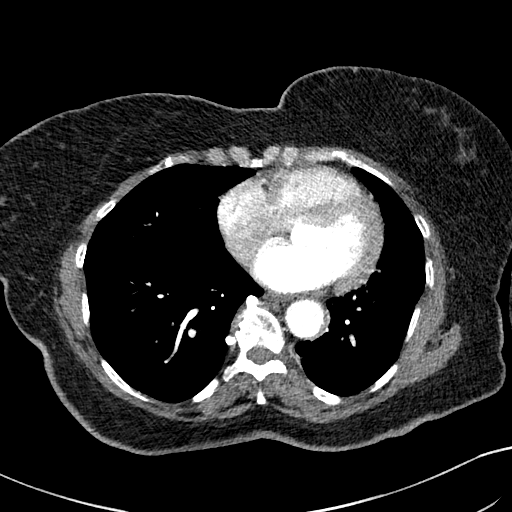
[im 60/156  lung]
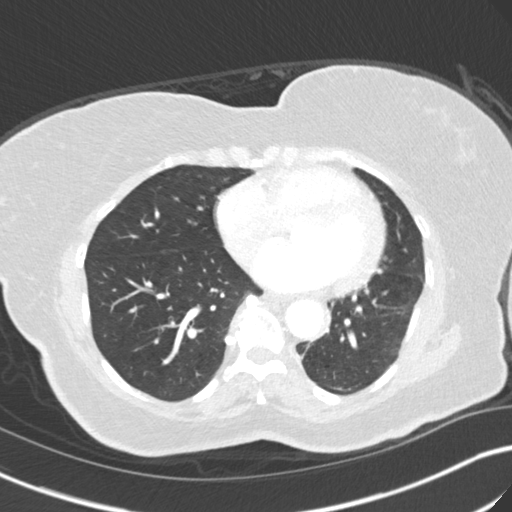
[im 72/156  lung]
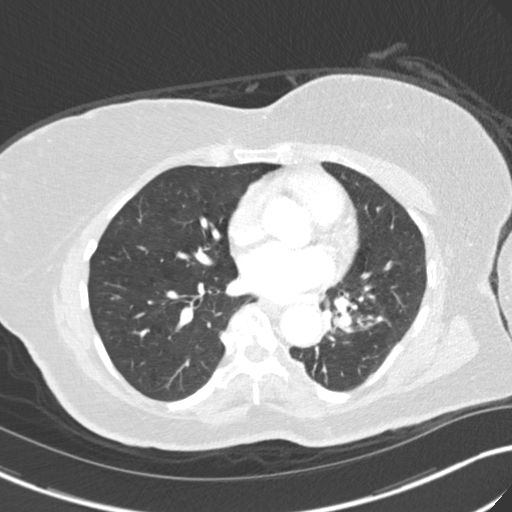
[im 84/156  lung]
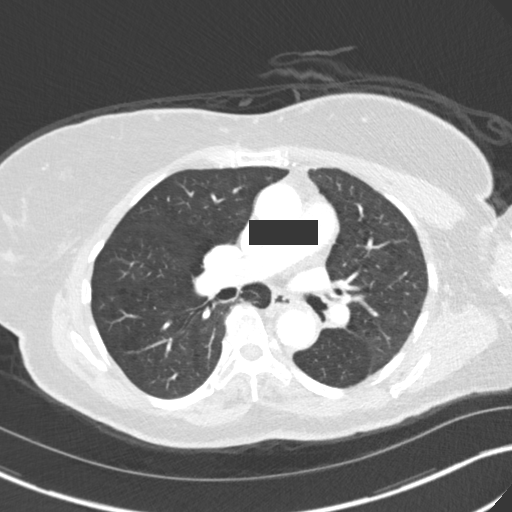
[im 96/156  lung]
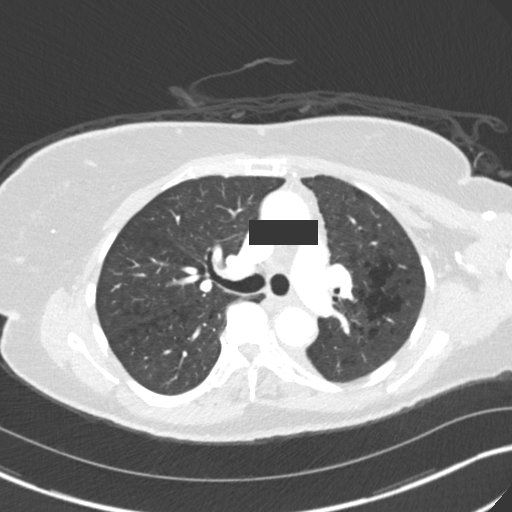
[im 108/156  mediastinal]
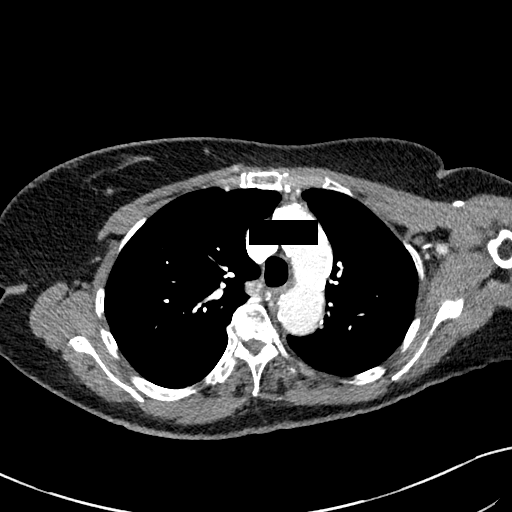
[im 108/156  lung]
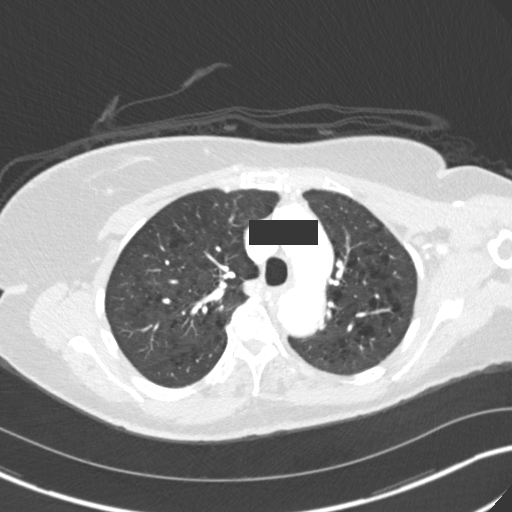
[im 120/156  lung]
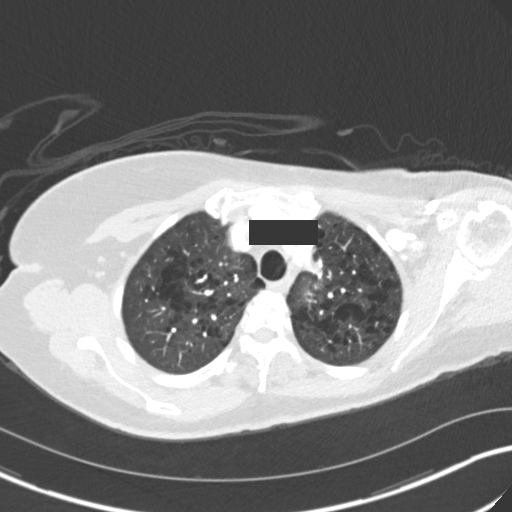
[im 132/156  lung]
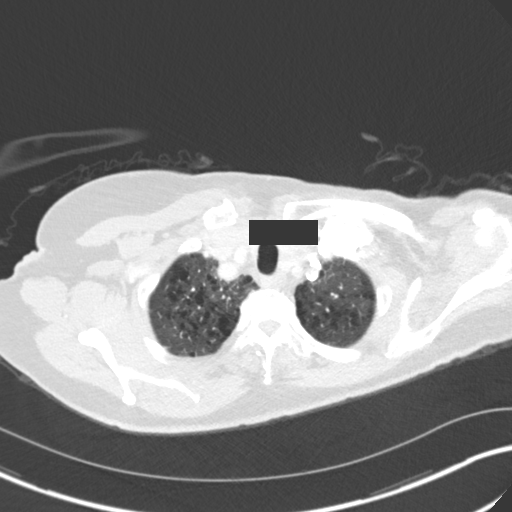
[im 144/156  lung]
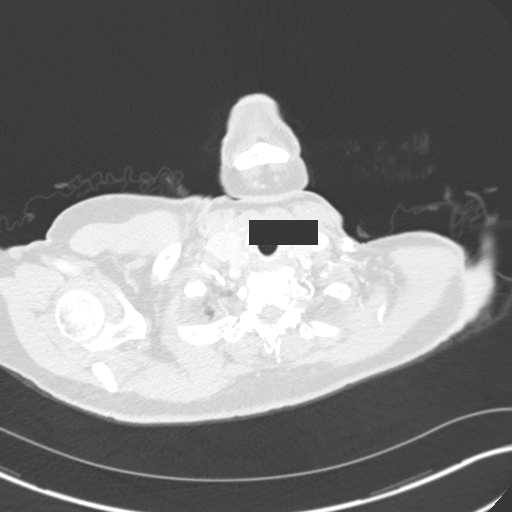

[Series 8: coronal · coronal · 0.63mm/px · 3 of 125 slices shown]
[im 25/125  lung]
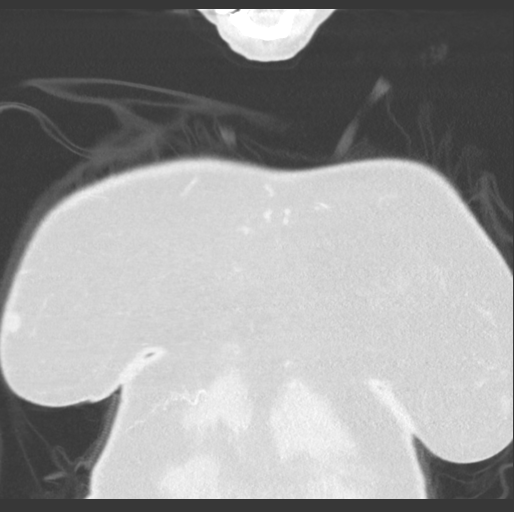
[im 50/125  lung]
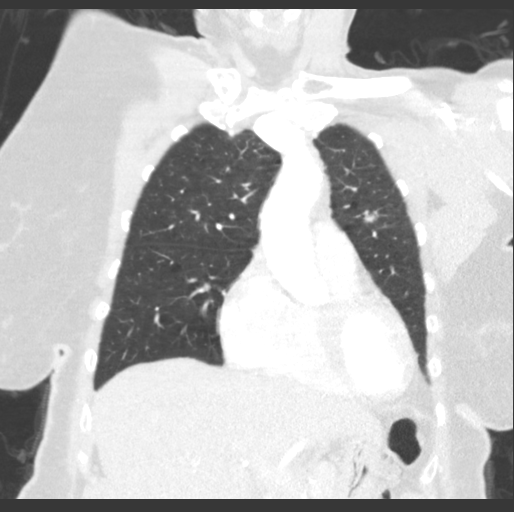
[im 75/125  lung]
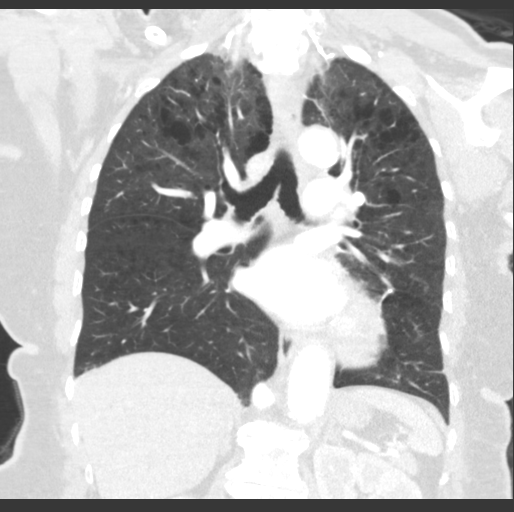

[15 of 36 positions shown; findings below may reference images not displayed]

FINDINGS: Cardiovascular: Heart is normal in size.  No pericardial effusion.

No evidence of thoracic aortic aneurysm. Atherosclerotic
calcifications of the aortic arch.

Mediastinum/Nodes: No suspicious mediastinal lymphadenopathy.

13 mm left thyroid nodule (series 2/image 22). Not clinically
significant; no follow-up imaging recommended (ref: [HOSPITAL]. [DATE]): 143-50).

Lungs/Pleura: Biapical pleural-parenchymal scarring.

Status post left lower lobectomy.

Moderate centrilobular and paraseptal emphysematous changes, upper
lung predominant.

No suspicious pulmonary nodules.

No focal consolidation.

No pleural effusion or pneumothorax.

Upper Abdomen: Stable low-density thickening of the right adrenal
gland (series 2/image 138), measuring at least 2.0 cm, compatible
with a benign adrenal adenoma. Vascular calcifications.

Musculoskeletal: Degenerative changes of the visualized
thoracolumbar spine.
IMPRESSION: Status post left lower lobectomy.

No evidence of recurrent or metastatic disease.

Benign right adrenal adenoma.

Aortic Atherosclerosis (NUZ5F-YKF.F) and Emphysema (NUZ5F-IDB.A).

## 2022-03-21 ENCOUNTER — Encounter: Payer: Self-pay | Admitting: Cardiology

## 2022-03-21 ENCOUNTER — Ambulatory Visit: Payer: Medicare Other | Attending: Cardiology | Admitting: Cardiology

## 2022-03-21 VITALS — BP 122/68 | HR 88 | Ht 61.0 in | Wt 182.4 lb

## 2022-03-21 DIAGNOSIS — I441 Atrioventricular block, second degree: Secondary | ICD-10-CM

## 2022-03-21 DIAGNOSIS — I10 Essential (primary) hypertension: Secondary | ICD-10-CM

## 2022-03-21 LAB — CUP PACEART INCLINIC DEVICE CHECK
Battery Remaining Longevity: 162 mo
Battery Voltage: 3.22 V
Brady Statistic AP VP Percent: 1.56 %
Brady Statistic AP VS Percent: 2.14 %
Brady Statistic AS VP Percent: 58.91 %
Brady Statistic AS VS Percent: 37.39 %
Brady Statistic RA Percent Paced: 3.75 %
Brady Statistic RV Percent Paced: 60.46 %
Date Time Interrogation Session: 20231115153245
Implantable Lead Connection Status: 753985
Implantable Lead Connection Status: 753985
Implantable Lead Implant Date: 20230809
Implantable Lead Implant Date: 20230809
Implantable Lead Location: 753859
Implantable Lead Location: 753860
Implantable Lead Model: 3830
Implantable Lead Model: 5076
Implantable Pulse Generator Implant Date: 20230809
Lead Channel Impedance Value: 380 Ohm
Lead Channel Impedance Value: 399 Ohm
Lead Channel Impedance Value: 437 Ohm
Lead Channel Impedance Value: 551 Ohm
Lead Channel Pacing Threshold Amplitude: 0.375 V
Lead Channel Pacing Threshold Amplitude: 0.625 V
Lead Channel Pacing Threshold Pulse Width: 0.4 ms
Lead Channel Pacing Threshold Pulse Width: 0.4 ms
Lead Channel Sensing Intrinsic Amplitude: 10.25 mV
Lead Channel Sensing Intrinsic Amplitude: 2.5 mV
Lead Channel Sensing Intrinsic Amplitude: 3.5 mV
Lead Channel Sensing Intrinsic Amplitude: 8.5 mV
Lead Channel Setting Pacing Amplitude: 1.5 V
Lead Channel Setting Pacing Amplitude: 2 V
Lead Channel Setting Pacing Pulse Width: 0.4 ms
Lead Channel Setting Sensing Sensitivity: 1.2 mV
Zone Setting Status: 755011

## 2022-03-21 NOTE — Progress Notes (Signed)
Electrophysiology Office Note   Date:  03/21/2022   ID:  Yolanda Bauer, DOB 1945/05/21, MRN [id number]  PCP:  Denny Levy, PA  Cardiologist:  Domenic Polite Primary Electrophysiologist:  Robecca Fulgham Meredith Leeds, MD    Chief Complaint: heart block   History of Present Illness: Yolanda Bauer is a 76 y.o. female who is being seen today for the evaluation of heart block at the request of Denny Levy, [place]. Presenting today for electrophysiology evaluation.  She has a history significant for squamous cell lung cancer post lobectomy, hypertension, hyperlipidemia.  She presented to the hospital August 2023 with advanced AV block.  This improved with washout of her beta-blocker, but she did develop further episodes.  She is now status post Medtronic dual-chamber pacemaker implanted 12/13/2021.  Today, she denies symptoms of palpitations, chest pain, shortness of breath, orthopnea, PND, lower extremity edema, claudication, dizziness, presyncope, syncope, bleeding, or neurologic sequela. The patient is tolerating medications without difficulties.    Past Medical History:  Diagnosis Date   Anxiety    Arthritis    Essential hypertension    Hyperlipidemia    On home oxygen therapy    Evenings   Pneumonia    Squamous cell lung cancer ([place]) 03/2020   Status post left lower lobe superior segmentectomy with lymph node dissection - Dr. Roxan Hockey   Past Surgical History:  Procedure Laterality Date   BACK SURGERY     CATARACT EXTRACTION, BILATERAL Bilateral    COLONOSCOPY WITH PROPOFOL N/A 11/21/2020   Procedure: COLONOSCOPY WITH PROPOFOL;  Surgeon: Eloise Harman, DO;  [facility];  Service: Endoscopy;  Laterality: N/A;  ASA III / 11:00   HERNIA REPAIR     INTERCOSTAL NERVE BLOCK Left 04/11/2020   Procedure: INTERCOSTAL NERVE BLOCK;  Surgeon: [name], MD;  Location: [facility];  Service: Thoracic;  Laterality: Left;   NODE DISSECTION Left 04/11/2020   Procedure: NODE  DISSECTION;  Surgeon: [name], MD;  Location: [facility];  Service: Thoracic;  Laterality: Left;   PACEMAKER IMPLANT N/A 12/13/2021   Procedure: PACEMAKER IMPLANT;  Surgeon: Constance Haw, MD;  Location: [facility];  Service: Cardiovascular;  Laterality: N/A;   POLYPECTOMY  11/21/2020   Procedure: POLYPECTOMY;  Surgeon: Eloise Harman, DO;  [facility];  Service: Endoscopy;;   TUBAL LIGATION     XI ROBOTIC ASSISTED THORACOSCOPY- SEGMENTECTOMY Left 04/11/2020   Procedure: XI ROBOTIC ASSISTED THORACOSCOPY-LEFT LOWER LOBE SUPERIOR SEGMENTECTOMY;  Surgeon: [name], MD;  Location: [facility];  Service: Thoracic;  Laterality: Left;     Current Outpatient Medications  Medication Sig Dispense Refill   acetaminophen (TYLENOL) 325 MG tablet Take 650 mg by mouth every 6 (six) hours as needed for moderate pain.     albuterol (VENTOLIN HFA) 108 (90 Base) MCG/ACT inhaler Inhale 2 puffs into the lungs every 6 (six) hours as needed for wheezing. 8 g 6   amLODipine (NORVASC) 5 MG tablet Take 1 tablet (5 mg total) by mouth daily. 90 tablet 3   Ascorbic Acid (VITAMIN C) 500 MG tablet Take 500 mg by mouth every other day.     aspirin 81 MG tablet Take 1 tablet (81 mg total) by mouth daily. 30 tablet    atorvastatin (LIPITOR) 40 MG tablet Take 40 mg by mouth daily.     bismuth subsalicylate (PEPTO BISMOL) 262 MG/15ML suspension Take 30 mLs by mouth every 6 (six) hours as needed for indigestion.  Coral Calcium 1000 (390 CA) MG TABS Take 1,000 mg by mouth every other day.     fluticasone (FLONASE) 50 MCG/ACT nasal spray Place 1 spray into both nostrils daily as needed for allergies.      Glycerin-Hypromellose-PEG 400 (DRY EYE RELIEF DROPS OP) Place 1 drop into both eyes every other day.     guaiFENesin (MUCINEX) 600 MG 12 hr tablet Take 600 mg by mouth as needed for to loosen phlegm.     loratadine (CLARITIN) 10 MG tablet Take 10 mg by mouth daily.     Menthol,  Topical Analgesic, (ICY HOT EX) Apply 1 application topically daily as needed (muscle pain).     Multiple Vitamin (MULTIVITAMIN) tablet Take 1 tablet by mouth every other day.     olmesartan-hydrochlorothiazide (BENICAR HCT) 40-12.5 MG tablet Take 1 tablet by mouth daily.     spironolactone (ALDACTONE) 25 MG tablet Take 25 mg by mouth 2 (two) times daily.     TRELEGY ELLIPTA 100-62.5-25 MCG/ACT AEPB USE 1 INHALATION BY MOUTH  DAILY (Patient taking differently: Take 1 Inhalation by mouth daily.) 180 each 3   No current facility-administered medications for this visit.    Allergies:   Penicillins   Social History:  The patient  reports that she quit smoking about 2 years ago. Her smoking use included cigarettes. She has a 45.00 pack-year smoking history. She has never used smokeless tobacco. She reports that she does not drink alcohol and does not use drugs.   Family History:  The patient's family history includes Alcoholism in her father; Dementia in her mother; Hypertension in her mother; Kidney disease in her father; Ovarian cancer in her mother.    ROS:  Please see the history of present illness.   Otherwise, review of systems is positive for none.   All other systems are reviewed and negative.    PHYSICAL EXAM: VS:  BP 122/68   Pulse 88   Ht 5\' 1"  (1.549 m)   Wt 182 lb 6.4 oz (82.7 kg)   SpO2 97%   BMI 34.46 kg/m  , BMI Body mass index is 34.46 kg/m. GEN: Well nourished, well developed, in no acute distress  HEENT: normal  Neck: no JVD, carotid bruits, or masses Cardiac: RRR; no murmurs, rubs, or gallops,no edema  Respiratory:  clear to auscultation bilaterally, normal work of breathing GI: soft, nontender, nondistended, + BS MS: no deformity or atrophy  Skin: warm and dry, device pocket is well healed Neuro:  Strength and sensation are intact Psych: euthymic mood, full affect  EKG:  EKG is ordered today. Personal review of the ekg ordered shows sinus rhythm, V  paced  Device interrogation is reviewed today in detail.  See PaceArt for details.   Recent Labs: 11/27/2021: ALT 94; B Natriuretic Peptide 658.7; Hemoglobin 12.3; Magnesium 2.1; Platelets 270; TSH 0.826 11/29/2021: BUN 18; Creatinine, Ser 1.00; Potassium 4.1; Sodium 141    Lipid Panel  No results found for: "CHOL", "TRIG", "HDL", "CHOLHDL", "VLDL", "LDLCALC", "LDLDIRECT"   Wt Readings from Last 3 Encounters:  03/21/22 182 lb 6.4 oz (82.7 kg)  03/12/22 181 lb 9.6 oz (82.4 kg)  12/13/21 175 lb (79.4 kg)      Other studies Reviewed: Additional studies/ records that were reviewed today include: TTE 11/27/21  Review of the above records today demonstrates:   1. Left ventricular ejection fraction, by estimation, is 60 to 65%. The  left ventricle has normal function. The left ventricle has no regional  wall  motion abnormalities. Left ventricular diastolic parameters are  indeterminate.   2. Right ventricular systolic function is normal. The right ventricular  size is normal.   3. The mitral valve is abnormal. Mild to moderate mitral valve  regurgitation. No evidence of mitral stenosis.   4. The aortic valve is tricuspid. There is mild calcification of the  aortic valve. There is mild thickening of the aortic valve. Aortic valve  regurgitation is mild. Aortic valve sclerosis is present, with no evidence  of aortic valve stenosis.   5. The inferior vena cava is normal in size with greater than 50%  respiratory variability, suggesting right atrial pressure of 3 mmHg.    ASSESSMENT AND PLAN:  1.  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  Device functioning appropriately.  No changes at this time.  2.  Hypertension: well controlled  Current medicines are reviewed at length with the patient today.   The patient does not have concerns regarding her medicines.  The following changes were made today:  none  Labs/ tests ordered today  include:  Orders Placed This Encounter  Procedures   EKG 12-Lead     Disposition:   FU 1 year  Signed, Darcel Zick Meredith Leeds, MD  03/21/2022 3:19 PM     [facility] [address] [place] [address] [phone] (office) [phone] (fax)

## 2022-03-21 NOTE — Patient Instructions (Signed)
Medication Instructions:  Your physician recommends that you continue on your current medications as directed. Please refer to the Current Medication list given to you today.  *If you need a refill on your cardiac medications before your next appointment, please call your pharmacy*   Lab Work: None ordered   Testing/Procedures: None ordered   Follow-Up: At [facility], you and your health needs are our priority.  As part of our continuing mission to provide you with exceptional heart care, we have created designated Provider Care Teams.  These Care Teams include your primary Cardiologist (physician) and Advanced Practice Providers (APPs -  Physician Assistants and Nurse Practitioners) who all work together to provide you with the care you need, when you need it.  Remote monitoring is used to monitor your Pacemaker or ICD from home. This monitoring reduces the number of office visits required to check your device to one time per year. It allows [place] to keep an eye on the functioning of your device to ensure it is working properly. You are scheduled for a device check from home on 06/14/22. You may send your transmission at any time that day. If you have a wireless device, the transmission will be sent automatically. After your physician reviews your transmission, you will receive a postcard with your next transmission date.  Your next appointment:   1 year(s)  The format for your next appointment:   In Person  Provider:   You will see one of the following Advanced Practice Providers on your designated Care Team:   Tommye Standard, [place] Legrand Como "Jonni Sanger" Chalmers Cater, [place]    Thank you for choosing [facility]!!   Trinidad Curet, RN [phone]    Other Instructions  Important Information About Sugar

## 2022-03-22 NOTE — Progress Notes (Signed)
Remote pacemaker transmission.   

## 2022-04-13 ENCOUNTER — Telehealth: Payer: Self-pay | Admitting: Internal Medicine

## 2022-04-13 MED ORDER — ALBUTEROL SULFATE HFA 108 (90 BASE) MCG/ACT IN AERS: 2.0000 | INHALATION_SPRAY | Freq: Four times a day (QID) | RESPIRATORY_TRACT | 6 refills | Status: AC | PRN

## 2022-04-13 NOTE — Telephone Encounter (Signed)
Refill sent to [facility]. Nothing further needed at this time.

## 2022-05-12 ENCOUNTER — Other Ambulatory Visit: Payer: Self-pay | Admitting: Internal Medicine

## 2022-05-15 DIAGNOSIS — Z902 Acquired absence of lung [part of]: Secondary | ICD-10-CM | POA: Diagnosis not present

## 2022-05-15 DIAGNOSIS — R911 Solitary pulmonary nodule: Secondary | ICD-10-CM | POA: Diagnosis not present

## 2022-05-30 DIAGNOSIS — R7989 Other specified abnormal findings of blood chemistry: Secondary | ICD-10-CM | POA: Diagnosis not present

## 2022-05-30 DIAGNOSIS — Z23 Encounter for immunization: Secondary | ICD-10-CM | POA: Diagnosis not present

## 2022-05-30 DIAGNOSIS — I10 Essential (primary) hypertension: Secondary | ICD-10-CM | POA: Diagnosis not present

## 2022-05-30 DIAGNOSIS — M79671 Pain in right foot: Secondary | ICD-10-CM | POA: Diagnosis not present

## 2022-05-30 DIAGNOSIS — R350 Frequency of micturition: Secondary | ICD-10-CM | POA: Diagnosis not present

## 2022-06-14 ENCOUNTER — Ambulatory Visit (INDEPENDENT_AMBULATORY_CARE_PROVIDER_SITE_OTHER): Payer: 59

## 2022-06-14 DIAGNOSIS — I442 Atrioventricular block, complete: Secondary | ICD-10-CM

## 2022-06-14 LAB — CUP PACEART REMOTE DEVICE CHECK
Battery Remaining Longevity: 160 mo
Battery Voltage: 3.2 V
Brady Statistic AP VP Percent: 2.85 %
Brady Statistic AP VS Percent: 4.62 %
Brady Statistic AS VP Percent: 46.67 %
Brady Statistic AS VS Percent: 45.86 %
Brady Statistic RA Percent Paced: 7.59 %
Brady Statistic RV Percent Paced: 49.52 %
Date Time Interrogation Session: 20240208072232
Implantable Lead Connection Status: 753985
Implantable Lead Connection Status: 753985
Implantable Lead Implant Date: 20230809
Implantable Lead Implant Date: 20230809
Implantable Lead Location: 753859
Implantable Lead Location: 753860
Implantable Lead Model: 3830
Implantable Lead Model: 5076
Implantable Pulse Generator Implant Date: 20230809
Lead Channel Impedance Value: 399 Ohm
Lead Channel Impedance Value: 399 Ohm
Lead Channel Impedance Value: 456 Ohm
Lead Channel Impedance Value: 551 Ohm
Lead Channel Pacing Threshold Amplitude: 0.375 V
Lead Channel Pacing Threshold Amplitude: 0.625 V
Lead Channel Pacing Threshold Pulse Width: 0.4 ms
Lead Channel Pacing Threshold Pulse Width: 0.4 ms
Lead Channel Sensing Intrinsic Amplitude: 2.375 mV
Lead Channel Sensing Intrinsic Amplitude: 2.375 mV
Lead Channel Sensing Intrinsic Amplitude: 7.625 mV
Lead Channel Sensing Intrinsic Amplitude: 7.625 mV
Lead Channel Setting Pacing Amplitude: 1.5 V
Lead Channel Setting Pacing Amplitude: 2 V
Lead Channel Setting Pacing Pulse Width: 0.4 ms
Lead Channel Setting Sensing Sensitivity: 1.2 mV
Zone Setting Status: 755011

## 2022-06-15 DIAGNOSIS — Z902 Acquired absence of lung [part of]: Secondary | ICD-10-CM | POA: Diagnosis not present

## 2022-06-15 DIAGNOSIS — R911 Solitary pulmonary nodule: Secondary | ICD-10-CM | POA: Diagnosis not present

## 2022-07-04 NOTE — Progress Notes (Signed)
Remote pacemaker transmission.   

## 2022-07-14 DIAGNOSIS — R911 Solitary pulmonary nodule: Secondary | ICD-10-CM | POA: Diagnosis not present

## 2022-07-14 DIAGNOSIS — Z902 Acquired absence of lung [part of]: Secondary | ICD-10-CM | POA: Diagnosis not present

## 2022-09-04 ENCOUNTER — Ambulatory Visit (HOSPITAL_COMMUNITY)
Admission: RE | Admit: 2022-09-04 | Discharge: 2022-09-04 | Disposition: A | Discharge status: Home / Self Care | Payer: Medicare HMO | Source: Ambulatory Visit | Attending: Internal Medicine | Admitting: Internal Medicine

## 2022-09-04 DIAGNOSIS — J449 Chronic obstructive pulmonary disease, unspecified: Secondary | ICD-10-CM | POA: Diagnosis not present

## 2022-09-04 LAB — PULMONARY FUNCTION TEST
DL/VA % pred: 84 %
DL/VA: 3.57 ml/min/mmHg/L
DLCO unc % pred: 59 %
DLCO unc: 10.29 ml/min/mmHg
FEF 25-75 Post: 0.43 L/sec
FEF 25-75 Pre: 0.36 L/sec
FEF2575-%Change-Post: 21 %
FEF2575-%Pred-Post: 27 %
FEF2575-%Pred-Pre: 22 %
FEV1-%Change-Post: 6 %
FEV1-%Pred-Post: 59 %
FEV1-%Pred-Pre: 55 %
FEV1-Post: 1.13 L
FEV1-Pre: 1.07 L
FEV1FVC-%Change-Post: 6 %
FEV1FVC-%Pred-Pre: 73 %
FEV6-%Change-Post: 3 %
FEV6-%Pred-Post: 74 %
FEV6-%Pred-Pre: 71 %
FEV6-Post: 1.79 L
FEV6-Pre: 1.74 L
FEV6FVC-%Change-Post: 3 %
FEV6FVC-%Pred-Post: 97 %
FEV6FVC-%Pred-Pre: 94 %
FVC-%Change-Post: 0 %
FVC-%Pred-Post: 75 %
FVC-%Pred-Pre: 76 %
FVC-Post: 1.93 L
FVC-Pre: 1.94 L
Post FEV1/FVC ratio: 59 %
Post FEV6/FVC ratio: 93 %
Pre FEV1/FVC ratio: 55 %
Pre FEV6/FVC Ratio: 90 %
RV % pred: 99 %
RV: 2.14 L
TLC % pred: 83 %
TLC: 3.91 L

## 2022-09-04 MED ORDER — ALBUTEROL SULFATE (2.5 MG/3ML) 0.083% IN NEBU
2.5000 mg | INHALATION_SOLUTION | Freq: Once | RESPIRATORY_TRACT | Status: AC
  Administered 2022-09-04: 2.5 mg via RESPIRATORY_TRACT

## 2022-09-10 NOTE — Progress Notes (Unsigned)
Yolanda Bauer, female    DOB: 26-Jun-1945    MRN: [id number]   Brief patient profile:  22 yobf quit smoking 03/2020 with hbp on ACEi with noct "wheeze" on prn albuterol helped some referred to pulmonary clinic in [place]  01/27/2020 by Dr Lawerance Sabal PA re SPN  LLL sup segment.    History of Present Illness  01/27/2020  Pulmonary/ 1st office eval/Yolanda Bauer  Chief Complaint  Patient presents with   Consult    productive cough with white phlegm in the mornings  Dyspnea:  No steps at her home but Not limited by breathing from desired activities   Cough: some am's / no hemoptysis/ some "wheeze at hs  Sleep: bed is flat/ one pillow  SABA use: none 02 none   rec Stop lisinopril and instead take olmesartan 20- 12.5 one daily  The key is to stop smoking completely before smoking completely stops you!   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   07/28/2020  f/u ov/[place] office/Yolanda Bauer re: s/p L sup  Segmentectomy  Chief Complaint  Patient presents with   Follow-up    Patient states has "shortness of breath sometimes but not as much as she used to"   Essential hypertension D/c acei 01/27/2020 due to report of noct "wheeze"   Dyspnea: able to shop [employer] sometimes s 02 /can't name one activity  Cough: nasal congestion  Sleeping: R side down/ bed is flat/ 2 pillows  SABA use: saba rarely  02: 2 lpm  Covid status: vax x 3 Lung cancer screening: n/a  rec Plan A = Automatic = Always=    Trelegy 100 each am  Work on inhaler technique:   Plan B = Backup (to supplement plan A, not to replace it) Only use your albuterol inhaler as a rescue medication  Continue 02 is 2lpm at bedtime and then during the day keep over  90%  Make sure you check your oxygen saturation  at your highest level of activity  to be sure it stays over 90%     09/11/2021  f/u ov/[place] office/Yolanda Bauer re: GOLD 3 copd / hypoxemic and hypercarbic maint on  Trelegy   Chief Complaint  Patient presents with   Follow-up    Using 2LO2 at night time. Feels breathing is good some days and bad some days   Dyspnea:  MMRC1 = can walk nl pace, flat grade, can't hurry or go uphills or steps s sob   -  only if overdoes it  Cough: none  Sleeping: flat bed 2 pillows SABA use: 2-3 x per week 02: 2lpm hs / not wearing prn daytime  Rec Plan A = Automatic = Always=    Trelegy one click 1st thing in am - take two good drags then rinse and gargle  Plan B = Backup (to supplement plan A, not to replace it) Only use your albuterol inhaler as a rescue medication  Work on inhaler technique:   Please schedule a follow up visit in 6 months but call sooner if needed - first thing in AM and don't take any inhalers that am   Pacemaker Dec 13 2021 cone for CHB > doe improved  09/11/2022  f/u ov/[place] office/Yolanda Bauer re: GOLD 3 COPD  maint on ***  No chief complaint on file.   Dyspnea:  *** Cough: *** Sleeping: *** SABA use: *** 02: *** Covid status: *** Lung cancer screening: ***   No  obvious day to day or daytime variability or assoc excess/ purulent sputum or mucus plugs or hemoptysis or cp or chest tightness, subjective wheeze or overt sinus or hb symptoms.   *** without nocturnal  or early am exacerbation  of respiratory  c/o's or need for noct saba. Also denies any obvious fluctuation of symptoms with weather or environmental changes or other aggravating or alleviating factors except as outlined above   No unusual exposure hx or h/o childhood pna/ asthma or knowledge of premature birth.  Current Allergies, Complete Past Medical History, Past Surgical History, Family History, and Social History were reviewed in [employer] record.  ROS  The following are not active complaints unless bolded Hoarseness, sore throat, dysphagia, dental problems, itching, sneezing,  nasal congestion or discharge of excess mucus or purulent secretions, ear  ache,   fever, chills, sweats, unintended wt loss or wt gain, classically pleuritic or exertional cp,  orthopnea pnd or arm/hand swelling  or leg swelling, presyncope, palpitations, abdominal pain, anorexia, nausea, vomiting, diarrhea  or change in bowel habits or change in bladder habits, change in stools or change in urine, dysuria, hematuria,  rash, arthralgias, visual complaints, headache, numbness, weakness or ataxia or problems with walking or coordination,  change in mood or  memory.        No outpatient medications have been marked as taking for the 09/11/22 encounter (Appointment) with Yolanda Cowden, MD.               Past Medical History:  Diagnosis Date   Hyperlipidemia    Hypertension        Objective:     Wts  09/11/2022          ***  03/12/2022        181  09/11/2021          176  03/06/2021      180   09/06/2020          168  07/28/20 164 lb (74.4 kg)  07/19/20 160 lb (72.6 kg)  05/12/20 156 lb 14.4 oz (71.2 kg)  01/26/2010       148   Vital signs reviewed  09/11/2022  - Note at rest 02 sats  ***% on ***   General appearance:    ***  Min barr***           Assessment

## 2022-09-11 ENCOUNTER — Encounter: Payer: Self-pay | Admitting: Internal Medicine

## 2022-09-11 ENCOUNTER — Ambulatory Visit: Payer: Medicare HMO | Admitting: Internal Medicine

## 2022-09-11 VITALS — BP 110/65 | HR 78 | Ht 61.0 in | Wt 180.4 lb

## 2022-09-11 DIAGNOSIS — J9612 Chronic respiratory failure with hypercapnia: Secondary | ICD-10-CM

## 2022-09-11 DIAGNOSIS — J449 Chronic obstructive pulmonary disease, unspecified: Secondary | ICD-10-CM

## 2022-09-11 DIAGNOSIS — J9611 Chronic respiratory failure with hypoxia: Secondary | ICD-10-CM

## 2022-09-11 MED ORDER — TRELEGY ELLIPTA 100-62.5-25 MCG/ACT IN AEPB: INHALATION_SPRAY | RESPIRATORY_TRACT | 3 refills | Status: DC

## 2022-09-11 NOTE — Assessment & Plan Note (Addendum)
Quit smoking 03/15/2020 - PFT's  03/15/20  FEV1 0.90 (44 % ) ratio 0.54  p 3 % improvement from saba p 0 prior to study with DLCO  12.22 (84%) corrects to 3.59 (85%)  for alv volume and FV curve classic concave contour on exp portion   - 07/28/2020  After extensive coaching inhaler device,  effectiveness =    0 with hfa - 07/28/2020  After extensive coaching inhaler device,  effectiveness =    75% with elipta with short ti > try trelegy > improved 09/06/2020   - PFT's  1.13  FEV1 1.13  (59 % ) ratio 0.59  p 6 % improvement from saba p trelegy prior to study with DLCO  10.29 (59%)   and FV curve classically concave       Group D (now reclassified as E) in terms of symptom/risk and laba/lama/ICS  therefore appropriate rx at this point >>>  trelegy 100  qam  plus approp saba   F/u can be  q 6 m, sooner prn

## 2022-09-11 NOTE — Assessment & Plan Note (Signed)
As of surgery 04/11/20 02 2lpm  07/28/2020   Walked RA  approx   400 ft  @ mod fast pace  stopped due to  Sob/ sats 90%    -  09/06/2020   Walked RA  approx   400 ft  @ mod pace  stopped due to min sob with sats 90%  - 03/06/2021   Walked on RA x  3  lap(s) =  approx 450 @ moderate pace, stopped due to end of study, sob @ 2nd lap  with lowest 02 sats 92% - HC03  05/12/21     32   - ONO 12/21/21 RA  desat < 89% x 1h 30m > 12/29/2021 try 2lpm and repeat on 2lpm   - HC03  11/29/21  = 27  - 03/12/2022   Walked on RA  x  3  lap(s) =  approx 450  ft  @ moderate  pace, stopped due to end of study  with lowest 02 sats 92% and no sob    No change 02 = 2lpm hs and prn daytime with goal of > 90%          Each maintenance medication was reviewed in detail including emphasizing most importantly the difference between maintenance and prns and under what circumstances the prns are to be triggered using an action plan format where appropriate.  Total time for H and P, chart review, counseling, reviewing hfa/dpi/02  device(s) and generating customized AVS unique to this office visit / same day charting = 25 min

## 2022-09-11 NOTE — Patient Instructions (Signed)
Continue Trelegy 100 one click each am   Only use your albuterol as a rescue medication to be used if you can't catch your breath by resting or doing a relaxed purse lip breathing pattern.  - The less you use it, the better it will work when you need it. - Ok to use up to 2 puffs  every 4 hours if you must but call for immediate appointment if use goes up over your usual need - Don't leave home without it !!  (think of it like the spare tire for your car)   Please schedule a follow up visit in 6 months but call sooner if needed

## 2022-09-13 ENCOUNTER — Ambulatory Visit (INDEPENDENT_AMBULATORY_CARE_PROVIDER_SITE_OTHER): Payer: Medicare HMO

## 2022-09-13 DIAGNOSIS — I441 Atrioventricular block, second degree: Secondary | ICD-10-CM | POA: Diagnosis not present

## 2022-09-13 LAB — CUP PACEART REMOTE DEVICE CHECK
Battery Remaining Longevity: 153 mo
Battery Voltage: 3.16 V
Brady Statistic AP VP Percent: 4.65 %
Brady Statistic AP VS Percent: 0.22 %
Brady Statistic AS VP Percent: 91.39 %
Brady Statistic AS VS Percent: 3.74 %
Brady Statistic RA Percent Paced: 4.9 %
Brady Statistic RV Percent Paced: 96.04 %
Date Time Interrogation Session: 20240508190143
Implantable Lead Connection Status: 753985
Implantable Lead Connection Status: 753985
Implantable Lead Implant Date: 20230809
Implantable Lead Implant Date: 20230809
Implantable Lead Location: 753859
Implantable Lead Location: 753860
Implantable Lead Model: 3830
Implantable Lead Model: 5076
Implantable Pulse Generator Implant Date: 20230809
Lead Channel Impedance Value: 380 Ohm
Lead Channel Impedance Value: 399 Ohm
Lead Channel Impedance Value: 475 Ohm
Lead Channel Impedance Value: 532 Ohm
Lead Channel Pacing Threshold Amplitude: 0.375 V
Lead Channel Pacing Threshold Amplitude: 0.625 V
Lead Channel Pacing Threshold Pulse Width: 0.4 ms
Lead Channel Pacing Threshold Pulse Width: 0.4 ms
Lead Channel Sensing Intrinsic Amplitude: 2.375 mV
Lead Channel Sensing Intrinsic Amplitude: 2.375 mV
Lead Channel Sensing Intrinsic Amplitude: 5.5 mV
Lead Channel Sensing Intrinsic Amplitude: 5.5 mV
Lead Channel Setting Pacing Amplitude: 1.5 V
Lead Channel Setting Pacing Amplitude: 2 V
Lead Channel Setting Pacing Pulse Width: 0.4 ms
Lead Channel Setting Sensing Sensitivity: 1.2 mV
Zone Setting Status: 755011

## 2022-09-19 DIAGNOSIS — Z7951 Long term (current) use of inhaled steroids: Secondary | ICD-10-CM | POA: Diagnosis not present

## 2022-09-19 DIAGNOSIS — J449 Chronic obstructive pulmonary disease, unspecified: Secondary | ICD-10-CM | POA: Diagnosis not present

## 2022-09-19 DIAGNOSIS — R609 Edema, unspecified: Secondary | ICD-10-CM | POA: Diagnosis not present

## 2022-09-19 DIAGNOSIS — E669 Obesity, unspecified: Secondary | ICD-10-CM | POA: Diagnosis not present

## 2022-09-19 DIAGNOSIS — Z880 Allergy status to penicillin: Secondary | ICD-10-CM | POA: Diagnosis not present

## 2022-09-19 DIAGNOSIS — E785 Hyperlipidemia, unspecified: Secondary | ICD-10-CM | POA: Diagnosis not present

## 2022-09-19 DIAGNOSIS — I10 Essential (primary) hypertension: Secondary | ICD-10-CM | POA: Diagnosis not present

## 2022-09-19 DIAGNOSIS — Z87891 Personal history of nicotine dependence: Secondary | ICD-10-CM | POA: Diagnosis not present

## 2022-09-19 DIAGNOSIS — M1990 Unspecified osteoarthritis, unspecified site: Secondary | ICD-10-CM | POA: Diagnosis not present

## 2022-09-19 DIAGNOSIS — R32 Unspecified urinary incontinence: Secondary | ICD-10-CM | POA: Diagnosis not present

## 2022-10-02 NOTE — Progress Notes (Signed)
Remote pacemaker transmission.   

## 2022-10-03 ENCOUNTER — Other Ambulatory Visit: Payer: Self-pay

## 2022-10-03 MED ORDER — AMLODIPINE BESYLATE 5 MG PO TABS: 5.0000 mg | ORAL_TABLET | Freq: Every day | ORAL | 1 refills | Status: DC

## 2022-10-03 NOTE — Telephone Encounter (Signed)
This is Dr. McDowell's pt. °

## 2022-10-14 IMAGING — CT CT CHEST W/ CM
2 of 3 series · 15 of 36 positions shown, 18 images · IV contrast (Omnipaque or Isovue)
Comparison: None.

CLINICAL DATA: Non-small cell lung cancer is. Follow-up LEFT lower
lobe segmentectomy.

EXAM:
CT CHEST WITH CONTRAST
TECHNIQUE: Multidetector CT imaging of the chest was performed during
intravenous contrast administration.

[Series 2: routine chest with · axial · 0.54mm/px · z∈[+2017,+2271]mm · 12 of 149 slices shown, 15 images]
[im 11/149  mediastinal]
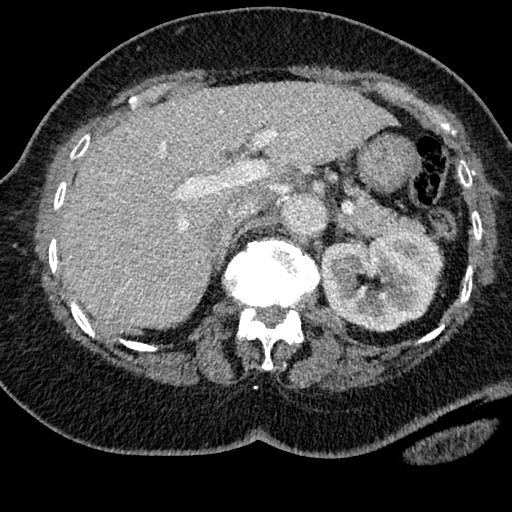
[im 11/149  lung]
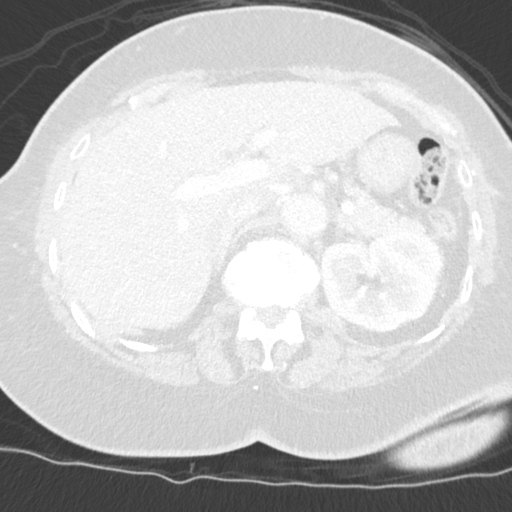
[im 22/149  lung]
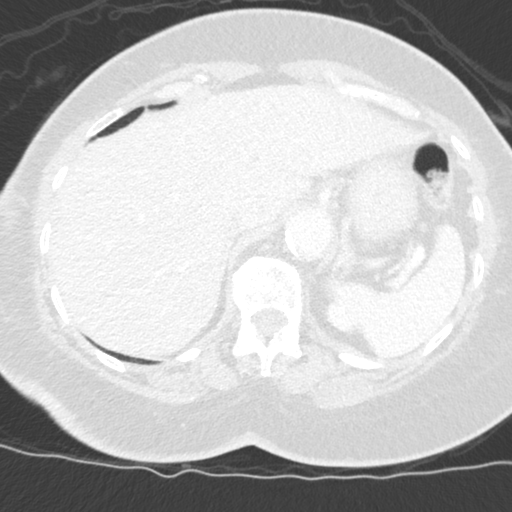
[im 33/149  lung]
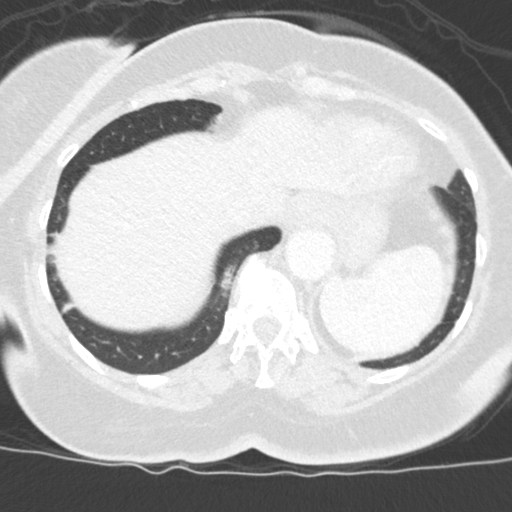
[im 44/149  lung]
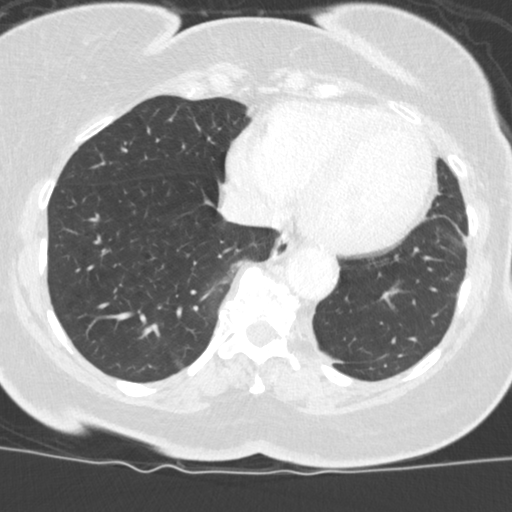
[im 55/149  mediastinal]
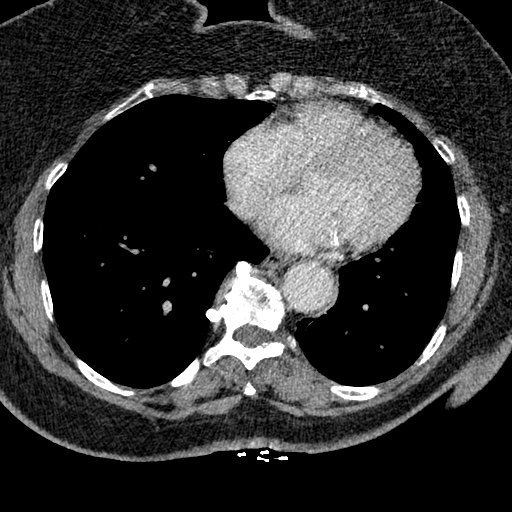
[im 55/149  lung]
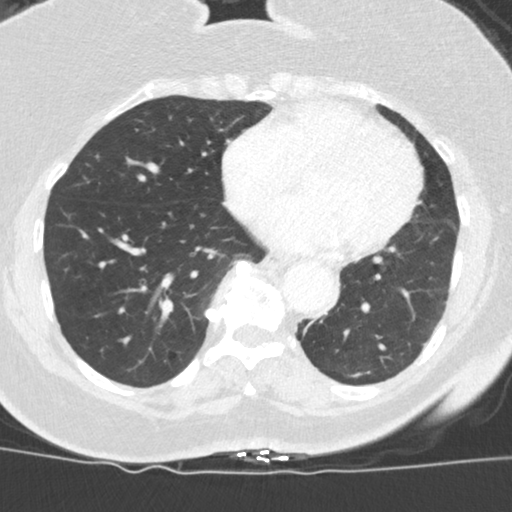
[im 66/149  lung]
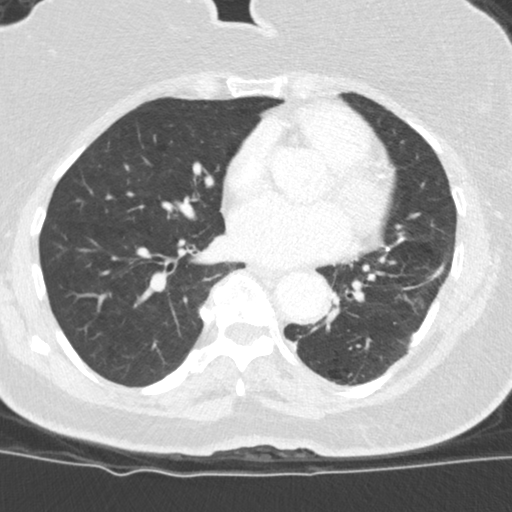
[im 83/149  lung]
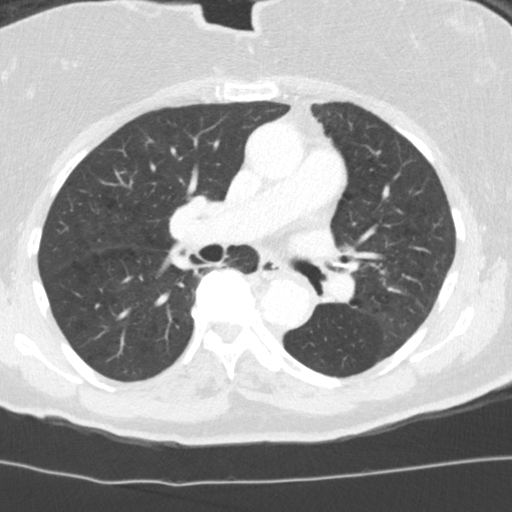
[im 94/149  lung]
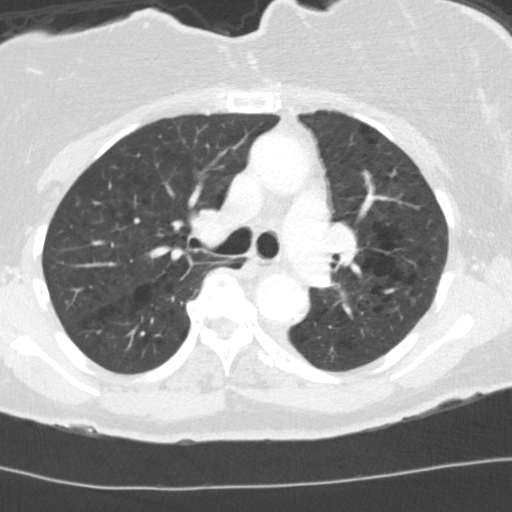
[im 105/149  mediastinal]
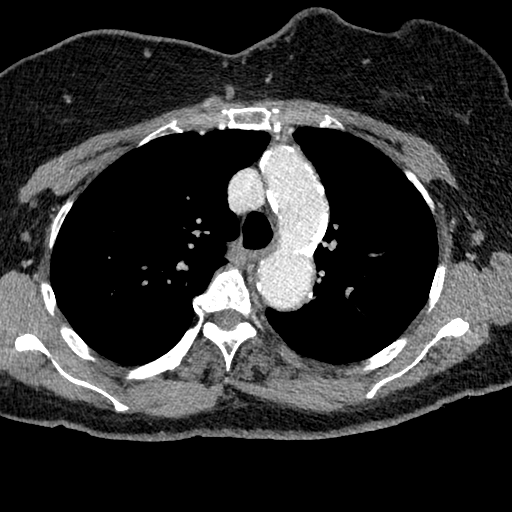
[im 105/149  lung]
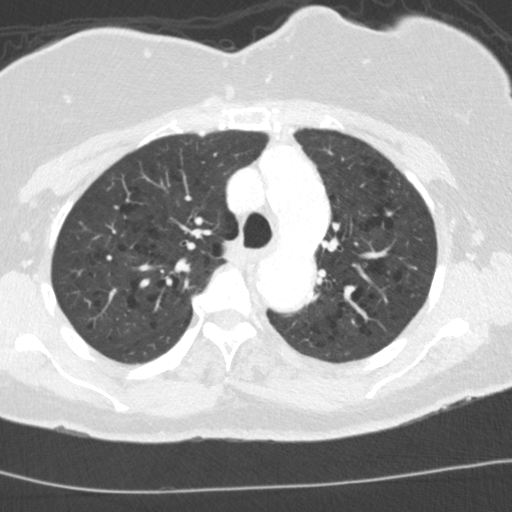
[im 116/149  lung]
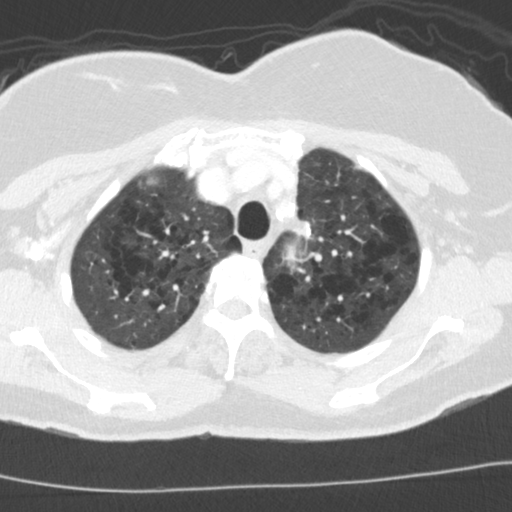
[im 127/149  lung]
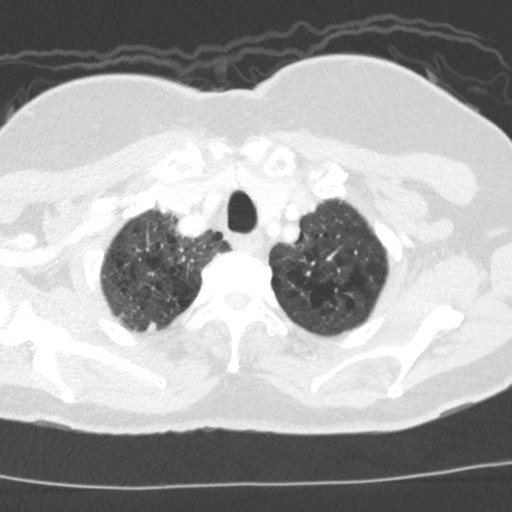
[im 138/149  lung]
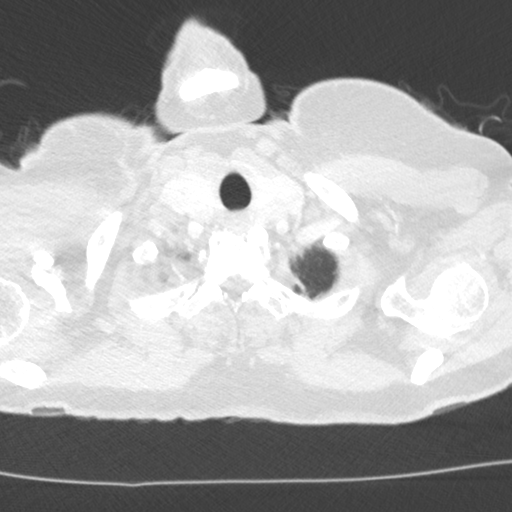

[Series 5: coronal · coronal · 0.57mm/px · 3 of 134 slices shown]
[im 27/134  lung]
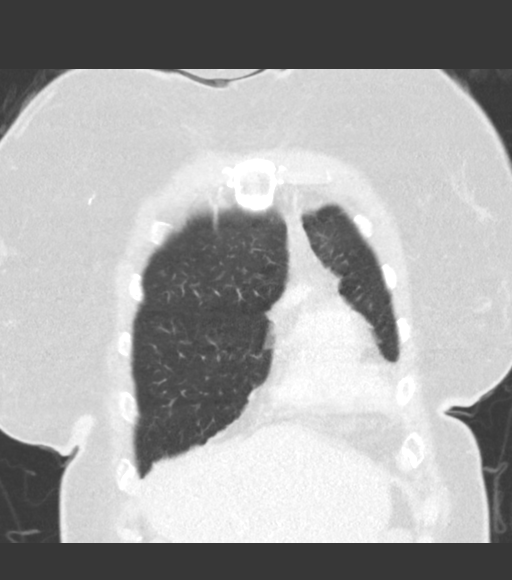
[im 54/134  lung]
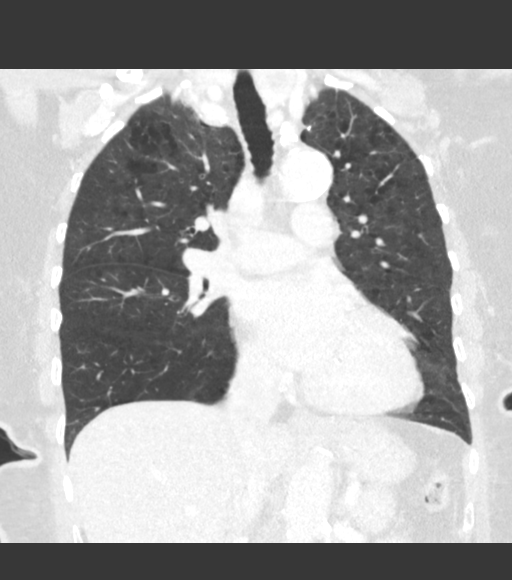
[im 80/134  lung]
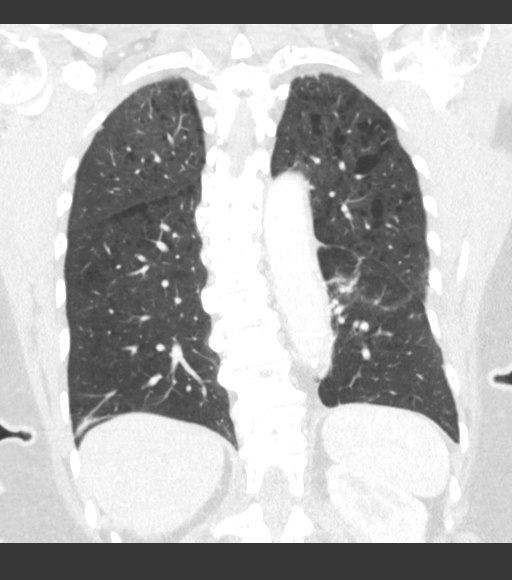

[15 of 36 positions shown; findings below may reference images not displayed]

RADIATION DOSE REDUCTION: This exam was performed according to the
departmental dose-optimization program which includes automated
exposure control, adjustment of the mA and/or kV according to
patient size and/or use of iterative reconstruction technique.

CONTRAST:  75mL OMNIPAQUE IOHEXOL 300 MG/ML  SOLN
FINDINGS: Cardiovascular: No significant vascular findings. Normal heart size.
No pericardial effusion.

Mediastinum/Nodes: No axillary or supraclavicular adenopathy. No
mediastinal or hilar adenopathy. No pericardial fluid. Esophagus
normal.

Lungs/Pleura: Post segmentectomy of the superior segment LEFT lower
lobe. No nodularity along resection section margin. No suspicious
nodules in LEFT or RIGHT lung. Upper lobe centrilobular emphysema.

Upper Abdomen: Tiny hypodense lesion in the central LEFT hepatic
lobe (image 126/2) is unchanged adrenal glands normal

Musculoskeletal: No aggressive osseous lesion.  Image
IMPRESSION: 1. No evidence of lung cancer recurrence or metastasis in the
thorax.
2. Postsurgical change in the LEFT lower lobe.
3. Centrilobular emphysema in  the upper lobes.

## 2022-10-17 DIAGNOSIS — M8589 Other specified disorders of bone density and structure, multiple sites: Secondary | ICD-10-CM | POA: Diagnosis not present

## 2022-10-17 DIAGNOSIS — M810 Age-related osteoporosis without current pathological fracture: Secondary | ICD-10-CM | POA: Diagnosis not present

## 2022-11-19 ENCOUNTER — Other Ambulatory Visit: Payer: Self-pay

## 2022-11-19 ENCOUNTER — Ambulatory Visit (HOSPITAL_COMMUNITY)
Admission: RE | Admit: 2022-11-19 | Discharge: 2022-11-19 | Disposition: A | Discharge status: Home / Self Care | Payer: Medicare HMO | Source: Ambulatory Visit | Attending: Internal Medicine | Admitting: Internal Medicine

## 2022-11-19 ENCOUNTER — Inpatient Hospital Stay: Payer: Medicare HMO | Attending: Internal Medicine

## 2022-11-19 DIAGNOSIS — I700 Atherosclerosis of aorta: Secondary | ICD-10-CM | POA: Diagnosis not present

## 2022-11-19 DIAGNOSIS — J432 Centrilobular emphysema: Secondary | ICD-10-CM | POA: Diagnosis not present

## 2022-11-19 DIAGNOSIS — C3490 Malignant neoplasm of unspecified part of unspecified bronchus or lung: Secondary | ICD-10-CM | POA: Insufficient documentation

## 2022-11-19 DIAGNOSIS — Z85118 Personal history of other malignant neoplasm of bronchus and lung: Secondary | ICD-10-CM | POA: Insufficient documentation

## 2022-11-19 DIAGNOSIS — R6889 Other general symptoms and signs: Secondary | ICD-10-CM | POA: Diagnosis not present

## 2022-11-19 LAB — CBC WITH DIFFERENTIAL (CANCER CENTER ONLY)
Abs Immature Granulocytes: 0.01 10*3/uL (ref 0.00–0.07)
Basophils Absolute: 0 10*3/uL (ref 0.0–0.1)
Basophils Relative: 0 %
Eosinophils Absolute: 0.1 10*3/uL (ref 0.0–0.5)
Eosinophils Relative: 2 %
HCT: 35.5 % — ABNORMAL LOW (ref 36.0–46.0)
Hemoglobin: 11.5 g/dL — ABNORMAL LOW (ref 12.0–15.0)
Immature Granulocytes: 0 %
Lymphocytes Relative: 20 %
Lymphs Abs: 1.2 10*3/uL (ref 0.7–4.0)
MCH: 29 pg (ref 26.0–34.0)
MCHC: 32.4 g/dL (ref 30.0–36.0)
MCV: 89.4 fL (ref 80.0–100.0)
Monocytes Absolute: 0.5 10*3/uL (ref 0.1–1.0)
Monocytes Relative: 8 %
Neutro Abs: 4.2 10*3/uL (ref 1.7–7.7)
Neutrophils Relative %: 70 %
Platelet Count: 283 10*3/uL (ref 150–400)
RBC: 3.97 MIL/uL (ref 3.87–5.11)
RDW: 14.4 % (ref 11.5–15.5)
WBC Count: 6.1 10*3/uL (ref 4.0–10.5)
nRBC: 0 % (ref 0.0–0.2)

## 2022-11-19 LAB — CMP (CANCER CENTER ONLY)
ALT: 13 U/L (ref 0–44)
AST: 15 U/L (ref 15–41)
Albumin: 4 g/dL (ref 3.5–5.0)
Alkaline Phosphatase: 60 U/L (ref 38–126)
Anion gap: 6 (ref 5–15)
BUN: 27 mg/dL — ABNORMAL HIGH (ref 8–23)
CO2: 28 mmol/L (ref 22–32)
Calcium: 9.7 mg/dL (ref 8.9–10.3)
Chloride: 107 mmol/L (ref 98–111)
Creatinine: 1 mg/dL (ref 0.44–1.00)
GFR, Estimated: 59 mL/min — ABNORMAL LOW (ref >60)
Glucose, Bld: 102 mg/dL — ABNORMAL HIGH (ref 70–99)
Potassium: 4 mmol/L (ref 3.5–5.1)
Sodium: 141 mmol/L (ref 135–145)
Total Bilirubin: 0.3 mg/dL (ref 0.3–1.2)
Total Protein: 7.2 g/dL (ref 6.5–8.1)

## 2022-11-19 MED ORDER — IOHEXOL 300 MG/ML  SOLN
75.0000 mL | Freq: Once | INTRAMUSCULAR | Status: AC | PRN
  Administered 2022-11-19: 75 mL via INTRAVENOUS

## 2022-11-21 ENCOUNTER — Inpatient Hospital Stay: Payer: Medicare HMO | Admitting: Internal Medicine

## 2022-11-21 ENCOUNTER — Other Ambulatory Visit: Payer: Self-pay

## 2022-11-21 VITALS — BP 156/67 | HR 85 | Temp 98.5°F | Resp 17 | Wt 181.6 lb

## 2022-11-21 DIAGNOSIS — C3490 Malignant neoplasm of unspecified part of unspecified bronchus or lung: Secondary | ICD-10-CM | POA: Diagnosis not present

## 2022-11-21 DIAGNOSIS — Z85118 Personal history of other malignant neoplasm of bronchus and lung: Secondary | ICD-10-CM | POA: Diagnosis not present

## 2022-11-21 DIAGNOSIS — R6889 Other general symptoms and signs: Secondary | ICD-10-CM | POA: Diagnosis not present

## 2022-11-21 NOTE — Progress Notes (Signed)
[facility] [phone]   [phone]  OFFICE PROGRESS NOTE  Lawerance Sabal, [place] [address] [place] [address]  DIAGNOSIS: Stage IB (T2 a, N0, M0) non-small cell lung cancer, squamous cell carcinoma diagnosed in November 2021.  PRIOR THERAPY: status post left lower lobe superior segmentectomy with lymph node dissection under the care of Dr. Dorris Fetch on March 12, 2020 and the final size of the tumor was 3.9 cm with visceral pleural invasion  CURRENT THERAPY: Observation.  INTERVAL HISTORY: Yolanda Bauer 77 y.o. female returns to the clinic today for annual follow-up visit.  The patient is feeling fine today with no concerning complaints.  She denied having any current chest pain, shortness of breath, cough or hemoptysis.  She has no nausea, vomiting, diarrhea or constipation.  She has no headache or visual changes.  She denied having any recent weight loss or night sweats.  She had the pacemaker placed recently.  The patient is here today for evaluation with repeat CT scan of the chest for restaging of her disease.   MEDICAL HISTORY: Past Medical History:  Diagnosis Date   Anxiety    Arthritis    Essential hypertension    Hyperlipidemia    On home oxygen therapy    Evenings   Pneumonia    Squamous cell lung cancer (HCC) 03/2020   Status post left lower lobe superior segmentectomy with lymph node dissection - Dr. Dorris Fetch    ALLERGIES:  is allergic to penicillins.  MEDICATIONS:  Current Outpatient Medications  Medication Sig Dispense Refill   ABRYSVO 120 MCG/0.5ML injection      acetaminophen (TYLENOL) 325 MG tablet Take 650 mg by mouth every 6 (six) hours as needed for moderate pain.     albuterol (VENTOLIN HFA) 108 (90 Base) MCG/ACT inhaler Inhale 2 puffs into the lungs every 6 (six) hours as needed for wheezing. 8 g 6   amLODipine (NORVASC) 5 MG tablet Take 1 tablet (5 mg total) by mouth daily. 90 tablet 1   Ascorbic Acid  (VITAMIN C) 500 MG tablet Take 500 mg by mouth every other day.     aspirin 81 MG tablet Take 1 tablet (81 mg total) by mouth daily. 30 tablet    atorvastatin (LIPITOR) 40 MG tablet Take 40 mg by mouth daily.     bismuth subsalicylate (PEPTO BISMOL) 262 MG/15ML suspension Take 30 mLs by mouth every 6 (six) hours as needed for indigestion.     Coral Calcium 1000 (390 CA) MG TABS Take 1,000 mg by mouth every other day.     fluticasone (FLONASE) 50 MCG/ACT nasal spray Place 1 spray into both nostrils daily as needed for allergies.      Fluticasone-Umeclidin-Vilant (TRELEGY ELLIPTA) 100-62.5-25 MCG/ACT AEPB One click each am 3 each 3   Glycerin-Hypromellose-PEG 400 (DRY EYE RELIEF DROPS OP) Place 1 drop into both eyes every other day.     guaiFENesin (MUCINEX) 600 MG 12 hr tablet Take 600 mg by mouth as needed for to loosen phlegm.     loratadine (CLARITIN) 10 MG tablet Take 10 mg by mouth daily.     Menthol, Topical Analgesic, (ICY HOT EX) Apply 1 application topically daily as needed (muscle pain).     Multiple Vitamin (MULTIVITAMIN) tablet Take 1 tablet by mouth every other day.     olmesartan-hydrochlorothiazide (BENICAR HCT) 40-12.5 MG tablet Take 1 tablet by mouth daily.     spironolactone (ALDACTONE) 25 MG tablet Take  25 mg by mouth 2 (two) times daily.     No current facility-administered medications for this visit.    SURGICAL HISTORY:  Past Surgical History:  Procedure Laterality Date   BACK SURGERY     CATARACT EXTRACTION, BILATERAL Bilateral    COLONOSCOPY WITH PROPOFOL N/A 11/21/2020   Procedure: COLONOSCOPY WITH PROPOFOL;  Surgeon: Lanelle Bal, DO;  Location: [facility];  Service: Endoscopy;  Laterality: N/A;  ASA III / 11:00   HERNIA REPAIR     INTERCOSTAL NERVE BLOCK Left 04/11/2020   Procedure: INTERCOSTAL NERVE BLOCK;  Surgeon: Loreli Slot, MD;  Location: [facility] OR;  Service: Thoracic;  Laterality: Left;   NODE DISSECTION Left 04/11/2020   Procedure: NODE  DISSECTION;  Surgeon: Loreli Slot, MD;  Location: [facility] OR;  Service: Thoracic;  Laterality: Left;   PACEMAKER IMPLANT N/A 12/13/2021   Procedure: PACEMAKER IMPLANT;  Surgeon: Regan Lemming, MD;  Location: [facility] INVASIVE CV LAB;  Service: Cardiovascular;  Laterality: N/A;   POLYPECTOMY  11/21/2020   Procedure: POLYPECTOMY;  Surgeon: Lanelle Bal, DO;  Location: [facility];  Service: Endoscopy;;   TUBAL LIGATION     XI ROBOTIC ASSISTED THORACOSCOPY- SEGMENTECTOMY Left 04/11/2020   Procedure: XI ROBOTIC ASSISTED THORACOSCOPY-LEFT LOWER LOBE SUPERIOR SEGMENTECTOMY;  Surgeon: Loreli Slot, MD;  Location: [facility] OR;  Service: Thoracic;  Laterality: Left;    REVIEW OF SYSTEMS:  A comprehensive review of systems was negative except for: Constitutional: positive for fatigue   PHYSICAL EXAMINATION: General appearance: alert, cooperative, and no distress Head: Normocephalic, without obvious abnormality, atraumatic Neck: no adenopathy, no JVD, supple, symmetrical, trachea midline, and thyroid not enlarged, symmetric, no tenderness/mass/nodules Lymph nodes: Cervical, supraclavicular, and axillary nodes normal. Resp: clear to auscultation bilaterally Back: symmetric, no curvature. ROM normal. No CVA tenderness. Cardio: regular rate and rhythm, S1, S2 normal, no murmur, click, rub or gallop GI: soft, non-tender; bowel sounds normal; no masses,  no organomegaly Extremities: extremities normal, atraumatic, no cyanosis or edema  ECOG PERFORMANCE STATUS: 1 - Symptomatic but completely ambulatory  Blood pressure (!) 156/67, pulse 85, temperature 98.5 F (36.9 C), temperature source Oral, resp. rate 17, weight 181 lb 9.6 oz (82.4 kg), SpO2 96%.  LABORATORY DATA: Lab Results  Component Value Date   WBC 6.1 11/19/2022   HGB 11.5 (L) 11/19/2022   HCT 35.5 (L) 11/19/2022   MCV 89.4 11/19/2022   PLT 283 11/19/2022      Chemistry      Component Value Date/Time   NA 141 11/19/2022  0757   K 4.0 11/19/2022 0757   CL 107 11/19/2022 0757   CO2 28 11/19/2022 0757   BUN 27 (H) 11/19/2022 0757   CREATININE 1.00 11/19/2022 0757      Component Value Date/Time   CALCIUM 9.7 11/19/2022 0757   ALKPHOS 60 11/19/2022 0757   AST 15 11/19/2022 0757   ALT 13 11/19/2022 0757   BILITOT 0.3 11/19/2022 0757       RADIOGRAPHIC STUDIES: CT Chest W Contrast  Result Date: 11/20/2022 CLINICAL DATA:  Non-small cell lung cancer staging; * Tracking Code: BO * EXAM: CT CHEST WITH CONTRAST TECHNIQUE: Multidetector CT imaging of the chest was performed during intravenous contrast administration. RADIATION DOSE REDUCTION: This exam was performed according to the departmental dose-optimization program which includes automated exposure control, adjustment of the mA and/or kV according to patient size and/or use of iterative reconstruction technique. CONTRAST:  75mL OMNIPAQUE IOHEXOL 300 MG/ML  SOLN COMPARISON:  Multiple priors, most recent chest CT dated November 16, 2021; PET-CT dated February 15, 2020 FINDINGS: Cardiovascular: Normal heart size. No pericardial effusion. Normal caliber thoracic aorta with moderate atherosclerotic disease. Left chest wall dual lead pacer with leads in the right atrium and right ventricle. Mediastinum/Nodes: Esophagus thyroid are unremarkable. No enlarged lymph nodes seen in the chest. Lungs/Pleura: Prior left lower lobectomy. Moderate centrilobular emphysema. No new or enlarging pulmonary nodules. No pleural effusion. Upper Abdomen: Stable right adrenal gland nodule, likely benign adenoma. Musculoskeletal: No aggressive appearing osseous lesions IMPRESSION: 1. Prior left lower lobectomy with no evidence of recurrent or metastatic disease. 2. Aortic Atherosclerosis (ICD10-I70.0) and Emphysema (ICD10-J43.9). Electronically Signed   By: Allegra Lai M.D.   On: 11/20/2022 08:36    ASSESSMENT AND PLAN: This is a very pleasant 77 years old African-American female diagnosed with  a stage Ib (T2 a, N0, M0) non-small cell lung cancer, squamous cell carcinoma status post left lower lobe superior segmentectomy with lymph node dissection under the care of Dr. Dorris Fetch on March 12, 2020 with tumor size of 3.9 cm and visceropleural involvement. The patient is currently on observation and she is feeling fine with no concerning complaints. She had repeat CT scan of the chest performed recently.  I personally and independently reviewed the scan and discussed the result with the patient today. Her scan showed no concerning findings for disease recurrence or metastasis. I recommended for her to continue on observation with repeat CT scan of the chest in 1 year. The patient was advised to call immediately if she has any other concerning symptoms in the interval. The patient voices understanding of current disease status and treatment options and is in agreement with the current care plan.  All questions were answered. The patient knows to call the clinic with any problems, questions or concerns. We can certainly see the patient much sooner if necessary.   Disclaimer: This note was dictated with voice recognition software. Similar sounding words can inadvertently be transcribed and may not be corrected upon review.

## 2022-11-26 NOTE — Progress Notes (Signed)
Cardiology Office Note:  .   Date:  12/10/2022  ID:  Yolanda Bauer, DOB 30-Aug-1945, MRN [id number] PCP: Lawerance Sabal, PA  [facility] Providers Cardiologist:  Nona Dell, MD Electrophysiologist:  Regan Lemming, MD    History of Present Illness: .   Yolanda Bauer is a 77 y.o. female with history of HTN, squamous cell lung CA, COPD on O2 at night, pacemaker for second degree AV block.  Last saw Dr. Diona Browner 05/2021, by me 12/2021, Dr. Elberta Fortis 03/2022.  Patient comes in for yearly f/u. Her daughter died of Lupus in Jul 16, 2022. Patient feels good. No cardiac complaints of chest pain, palpitations,edema. Some DOE if walking in extreme heat. Still using O2 at night. 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   ROS:    Studies Reviewed: Marland Kitchen         Prior CV Studies: ECHO COMPLETE WO IMAGING ENHANCING AGENT 11/27/2021  Narrative ECHOCARDIOGRAM REPORT    Patient Name:   Yolanda Bauer Date of Exam: 11/27/2021 Medical Rec #:  [id number]     Height:       61.0 in Accession #:    [id number]    Weight:       181.4 lb Date of Birth:  23-Jun-1945    BSA:          1.812 m Patient Age:    75 years      BP:           195/62 mmHg Patient Gender: F             HR:           48 bpm. Exam Location:  Inpatient  Procedure: 2D Echo  Indications:    dyspnea  History:        Patient has prior history of Echocardiogram examinations, most recent 06/22/2021. Arrythmias:complete heart block, Signs/Symptoms:Dyspnea; Risk Factors:Hypertension.  Sonographer:    Delcie Roch RDCS Referring Phys: [id number]  M   IMPRESSIONS   1. Left ventricular ejection fraction, by estimation, is 60 to 65%. The left ventricle has normal function. The left ventricle has no regional wall motion abnormalities. Left ventricular diastolic parameters are indeterminate. 2. Right ventricular systolic function is normal. The right ventricular size is normal. 3. The mitral  valve is abnormal. Mild to moderate mitral valve regurgitation. No evidence of mitral stenosis. 4. The aortic valve is tricuspid. There is mild calcification of the aortic valve. There is mild thickening of the aortic valve. Aortic valve regurgitation is mild. Aortic valve sclerosis is present, with no evidence of aortic valve stenosis. 5. The inferior vena cava is normal in size with greater than 50% respiratory variability, suggesting right atrial pressure of 3 mmHg.  FINDINGS Left Ventricle: Left ventricular ejection fraction, by estimation, is 60 to 65%. The left ventricle has normal function. The left ventricle has no regional wall motion abnormalities. The left ventricular internal cavity size was normal in size. There is no left ventricular hypertrophy. Left ventricular diastolic parameters are indeterminate.  Right Ventricle: The right ventricular size is normal. No increase in right ventricular wall thickness. Right ventricular systolic function is normal.  Left Atrium: Left atrial size was normal in size.  Right Atrium: Right atrial size was normal in size.  Pericardium: There is no evidence of pericardial effusion.  Mitral Valve: The mitral valve is abnormal. There is moderate thickening of the mitral valve leaflet(s). There is moderate calcification of the mitral valve leaflet(s). Mild  to moderate mitral valve regurgitation. No evidence of mitral valve stenosis. MV peak gradient, 168.0 mmHg. The mean mitral valve gradient is 125.0 mmHg.  Tricuspid Valve: The tricuspid valve is normal in structure. Tricuspid valve regurgitation is mild . No evidence of tricuspid stenosis.  Aortic Valve: The aortic valve is tricuspid. There is mild calcification of the aortic valve. There is mild thickening of the aortic valve. Aortic valve regurgitation is mild. Aortic regurgitation PHT measures 658 msec. Aortic valve sclerosis is present, with no evidence of aortic valve stenosis.  Pulmonic Valve:  The pulmonic valve was normal in structure. Pulmonic valve regurgitation is not visualized. No evidence of pulmonic stenosis.  Aorta: The aortic root is normal in size and structure.  Venous: The inferior vena cava is normal in size with greater than 50% respiratory variability, suggesting right atrial pressure of 3 mmHg.  IAS/Shunts: No atrial level shunt detected by color flow Doppler.   LEFT VENTRICLE PLAX 2D LVIDd:         5.10 cm   Diastology LVIDs:         2.70 cm   LV e' medial: 15.40 cm/s LV PW:         0.80 cm LV IVS:        0.80 cm LVOT diam:     1.70 cm LV SV:         79 LV SV Index:   44 LVOT Area:     2.27 cm   RIGHT VENTRICLE             IVC RV S prime:     14.30 cm/s  IVC diam: 1.30 cm TAPSE (M-mode): 2.4 cm  LEFT ATRIUM             Index        RIGHT ATRIUM           Index LA diam:        3.70 cm 2.04 cm/m   RA Area:     11.80 cm LA Vol (A2C):   58.6 ml 32.34 ml/m  RA Volume:   24.00 ml  13.24 ml/m LA Vol (A4C):   71.6 ml 39.51 ml/m LA Biplane Vol: 67.6 ml 37.31 ml/m AORTIC VALVE LVOT Vmax:   174.00 cm/s LVOT Vmean:  91.000 cm/s LVOT VTI:    0.348 m AI PHT:      658 msec  AORTA Ao Root diam: 2.90 cm Ao Asc diam:  2.90 cm  MITRAL VALVE MV Area VTI:  0.33 cm    SHUNTS MV Peak grad: 168.0 mmHg  Systemic VTI:  0.35 m MV Mean grad: 125.0 mmHg  Systemic Diam: 1.70 cm MV Vmax:      6.48 m/s MV Vmean:     536.0 cm/s  Charlton Haws MD Electronically signed by Charlton Haws MD Signature Date/Time: 11/27/2021/6:09:42 PM    Final       Risk Assessment/Calculations:             Physical Exam:   VS:  BP 114/60   Pulse 73   Ht 5\' 1"  (1.549 m)   Wt 182 lb (82.6 kg)   SpO2 96%   BMI 34.39 kg/m    Wt Readings from Last 3 Encounters:  12/10/22 182 lb (82.6 kg)  11/21/22 181 lb 9.6 oz (82.4 kg)  09/11/22 180 lb 6.4 oz (81.8 kg)    GEN: Obese, in no acute distress NECK: No JVD; No carotid bruits CARDIAC:  RRR, no murmurs, rubs,  gallops RESPIRATORY:  Clear to auscultation without rales, wheezing or rhonchi  ABDOMEN: Soft, non-tender, non-distended EXTREMITIES:  No edema; No deformity   ASSESSMENT AND PLAN: .    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 Pacer check 09/2022 normal no complaints  HTN well controlled on amlodipine and spiro. Labs stable 11/2022  HLD on atorvastatin-needs FLP  Squamous cell lung CA s/p lobectomy  COPD on O2 at night. Followed by Dr. Sherene Sires.         Dispo: F/U in 1 yr.  Signed, Jacolyn Reedy, PA-C

## 2022-11-28 DIAGNOSIS — Z0000 Encounter for general adult medical examination without abnormal findings: Secondary | ICD-10-CM | POA: Diagnosis not present

## 2022-11-28 DIAGNOSIS — Z1389 Encounter for screening for other disorder: Secondary | ICD-10-CM | POA: Diagnosis not present

## 2022-11-28 DIAGNOSIS — Z1331 Encounter for screening for depression: Secondary | ICD-10-CM | POA: Diagnosis not present

## 2022-11-28 DIAGNOSIS — I10 Essential (primary) hypertension: Secondary | ICD-10-CM | POA: Diagnosis not present

## 2022-11-28 DIAGNOSIS — I442 Atrioventricular block, complete: Secondary | ICD-10-CM | POA: Diagnosis not present

## 2022-11-28 DIAGNOSIS — Z6833 Body mass index (BMI) 33.0-33.9, adult: Secondary | ICD-10-CM | POA: Diagnosis not present

## 2022-12-10 ENCOUNTER — Ambulatory Visit: Payer: Medicare HMO | Attending: Physician Assistant | Admitting: Physician Assistant

## 2022-12-10 ENCOUNTER — Encounter: Payer: Self-pay | Admitting: Physician Assistant

## 2022-12-10 VITALS — BP 114/60 | HR 73 | Ht 61.0 in | Wt 182.0 lb

## 2022-12-10 DIAGNOSIS — E785 Hyperlipidemia, unspecified: Secondary | ICD-10-CM

## 2022-12-10 DIAGNOSIS — J449 Chronic obstructive pulmonary disease, unspecified: Secondary | ICD-10-CM | POA: Diagnosis not present

## 2022-12-10 DIAGNOSIS — C3432 Malignant neoplasm of lower lobe, left bronchus or lung: Secondary | ICD-10-CM

## 2022-12-10 DIAGNOSIS — I10 Essential (primary) hypertension: Secondary | ICD-10-CM | POA: Diagnosis not present

## 2022-12-10 DIAGNOSIS — I442 Atrioventricular block, complete: Secondary | ICD-10-CM

## 2022-12-10 MED ORDER — AMLODIPINE BESYLATE 5 MG PO TABS: 5.0000 mg | ORAL_TABLET | Freq: Every day | ORAL | 3 refills | Status: DC

## 2022-12-10 NOTE — Patient Instructions (Signed)
Medication Instructions:  Your physician recommends that you continue on your current medications as directed. Please refer to the Current Medication list given to you today.  *If you need a refill on your cardiac medications before your next appointment, please call your pharmacy*   Lab Work: Your physician recommends that you return for lab work in: Fasting ( Lipid)   If you have labs (blood work) drawn today and your tests are completely normal, you will receive your results only by: MyChart Message (if you have MyChart) OR A paper copy in the mail If you have any lab test that is abnormal or we need to change your treatment, we will call you to review the results.   Testing/Procedures: NONE    Follow-Up: At [facility], you and your health needs are our priority.  As part of our continuing mission to provide you with exceptional heart care, we have created designated Provider Care Teams.  These Care Teams include your primary Cardiologist (physician) and Advanced Practice Providers (APPs -  Physician Assistants and Nurse Practitioners) who all work together to provide you with the care you need, when you need it.  We recommend signing up for the patient portal called "MyChart".  Sign up information is provided on this After Visit Summary.  MyChart is used to connect with patients for Virtual Visits (Telemedicine).  Patients are able to view lab/test results, encounter notes, upcoming appointments, etc.  Non-urgent messages can be sent to your provider as well.   To learn more about what you can do with MyChart, go to [email or web address].    Your next appointment:   1 year(s)  Provider:   You may see Nona Dell, MD or one of the following Advanced Practice Providers on your designated Care Team:   Randall An, PA-C  Jacolyn Reedy, PA-C     Other Instructions Thank you for choosing [facility]!

## 2022-12-13 ENCOUNTER — Ambulatory Visit (INDEPENDENT_AMBULATORY_CARE_PROVIDER_SITE_OTHER): Payer: Medicare Other

## 2022-12-13 DIAGNOSIS — I442 Atrioventricular block, complete: Secondary | ICD-10-CM

## 2022-12-14 DIAGNOSIS — E7849 Other hyperlipidemia: Secondary | ICD-10-CM | POA: Diagnosis not present

## 2023-01-11 DIAGNOSIS — Z1231 Encounter for screening mammogram for malignant neoplasm of breast: Secondary | ICD-10-CM | POA: Diagnosis not present

## 2023-01-22 DIAGNOSIS — Z23 Encounter for immunization: Secondary | ICD-10-CM | POA: Diagnosis not present

## 2023-01-29 NOTE — Progress Notes (Signed)
Remote pacemaker transmission.   

## 2023-03-06 DIAGNOSIS — Z23 Encounter for immunization: Secondary | ICD-10-CM | POA: Diagnosis not present

## 2023-03-10 NOTE — Progress Notes (Unsigned)
Yolanda Bauer, female    DOB: 1946-04-03    MRN: [id number]   Brief patient profile:  86 yobf quit smoking 03/2020 with hbp on ACEi with noct "wheeze" on prn albuterol helped some referred to pulmonary clinic in [place]  01/27/2020 by Dr Lawerance Sabal PA re SPN  LLL sup segment.    History of Present Illness  01/27/2020  Pulmonary/ 1st office eval/[name]  Chief Complaint  Patient presents with   Consult    productive cough with white phlegm in the mornings  Dyspnea:  No steps at her home but Not limited by breathing from desired activities   Cough: some am's / no hemoptysis/ some "wheeze at hs  Sleep: bed is flat/ one pillow  SABA use: none 02 none   rec Stop lisinopril and instead take olmesartan 20- 12.5 one daily  The key is to stop smoking completely before smoking completely stops you!   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   07/28/2020  f/u ov/[place] office/[name] re: s/p L sup  Segmentectomy  Chief Complaint  Patient presents with   Follow-up    Patient states has "shortness of breath sometimes but not as much as she used to"   Essential hypertension D/c acei 01/27/2020 due to report of noct "wheeze"   Dyspnea: able to shop [employer] sometimes s 02 /can't name one activity  Cough: nasal congestion  Sleeping: R side down/ bed is flat/ 2 pillows  SABA use: saba rarely  02: 2 lpm  Covid status: vax x 3 Lung cancer screening: n/a  rec Plan A = Automatic = Always=    Trelegy 100 each am  Work on inhaler technique:   Plan B = Backup (to supplement plan A, not to replace it) Only use your albuterol inhaler as a rescue medication  Continue 02 is 2lpm at bedtime and then during the day keep over  90%  Make sure you check your oxygen saturation  at your highest level of activity  to be sure it stays over 90%     09/11/2021  f/u ov/[name] office/Sheree Lalla re: 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 Feels breathing is good some days and bad some days   Dyspnea:  MMRC1 = can walk nl pace, flat grade, can't hurry or go uphills or steps s sob   -  only if overdoes it  Cough: none  Sleeping: flat bed 2 pillows SABA use: 2-3 x per week 02: 2lpm hs / not wearing prn daytime  Rec Plan A = Automatic = Always=    Trelegy one click 1st thing in am - take two good drags then rinse and gargle  Plan B = Backup (to supplement plan A, not to replace it) Only use your albuterol inhaler as a rescue medication  Work on inhaler technique:   Please schedule a follow up visit in 6 months but call sooner if needed - first thing in AM and don't take any inhalers that am   Pacemaker Dec 13 2021 cone for CHB > doe improved  09/11/2022  f/u ov/[name] office/Krishika Bugge re: GOLD 2 COPD  maint on Trelegy   Chief Complaint  Patient presents with   Follow-up    Pt f/u she is here to discuss PFT results. Does say that her breathing has been  fine she had a lot of difficulty in February   Dyspnea:  MMRC1 = can walk nl pace, flat grade, can't hurry or go uphills or steps s sob   Cough: none Sleeping: flat bed/ 2 pillows  SABA use: less than a few times a week 02: 2lpm hs  only  Rec Continue Trelegy 100 one click each am  Only use your albuterol as a rescue medication     03/11/2023  f/u ov/[place] office/[name] re: GOLD 2 copd/ 02 hs only   maint on trelegy 100   Chief Complaint  Patient presents with   COPD GOLD III  Dyspnea:  no change MM1  Cough: assoc with with pnds she gets q fall > better on zyrtec  Sleeping: bed is flat with 2 pillows s  resp cc  SABA use: none  02: 2lpm hs   No obvious day to day or daytime variability or assoc excess/ purulent sputum or mucus plugs or hemoptysis or cp or chest tightness, subjective wheeze or overt sinus or hb symptoms.    Also denies any obvious fluctuation of symptoms  with weather or environmental changes or other aggravating or alleviating factors except as outlined above   No unusual exposure hx or h/o childhood pna/ asthma or knowledge of premature birth.  Current Allergies, Complete Past Medical History, Past Surgical History, Family History, and Social History were reviewed in [employer] record.  ROS  The following are not active complaints unless bolded Hoarseness, sore throat, dysphagia, dental problems, itching, sneezing,  nasal congestion or discharge of excess mucus or purulent secretions, ear ache,   fever, chills, sweats, unintended wt loss or wt gain, classically pleuritic or exertional cp,  orthopnea pnd or arm/hand swelling  or leg swelling, presyncope, palpitations, abdominal pain, anorexia, nausea, vomiting, diarrhea  or change in bowel habits or change in bladder habits, change in stools or change in urine, dysuria, hematuria,  rash, arthralgias, visual complaints, headache, numbness, weakness or ataxia or problems with walking or coordination,  change in mood or  memory.        Current Meds  Medication Sig   ABRYSVO 120 MCG/0.5ML injection    acetaminophen (TYLENOL) 325 MG tablet Take 650 mg by mouth every 6 (six) hours as needed for moderate pain.   albuterol (VENTOLIN HFA) 108 (90 Base) MCG/ACT inhaler Inhale 2 puffs into the lungs every 6 (six) hours as needed for wheezing.   amLODipine (NORVASC) 5 MG tablet Take 1 tablet (5 mg total) by mouth daily.   Ascorbic Acid (VITAMIN C) 500 MG tablet Take 500 mg by mouth every other day.   aspirin 81 MG tablet Take 1 tablet (81 mg total) by mouth daily.   atorvastatin (LIPITOR) 40 MG tablet Take 40 mg by mouth daily.   bismuth subsalicylate (PEPTO BISMOL) 262 MG/15ML suspension Take 30 mLs by mouth every 6 (six) hours as needed for indigestion.   Coral Calcium 1000 (390 CA) MG TABS Take 1,000 mg by mouth every other day.   fluticasone (FLONASE) 50 MCG/ACT nasal spray Place  1 spray into both nostrils daily as needed for allergies.    Fluticasone-Umeclidin-Vilant (TRELEGY ELLIPTA) 100-62.5-25 MCG/ACT AEPB One click each am   Glycerin-Hypromellose-PEG 400 (DRY EYE RELIEF DROPS OP) Place 1 drop into both eyes every other day.   guaiFENesin (MUCINEX) 600 MG 12 hr tablet Take 600 mg by mouth as needed for to loosen phlegm.   loratadine (CLARITIN) 10 MG tablet Take 10 mg by  mouth daily.   Menthol, Topical Analgesic, (ICY HOT EX) Apply 1 application topically daily as needed (muscle pain).   Multiple Vitamin (MULTIVITAMIN) tablet Take 1 tablet by mouth every other day.   olmesartan-hydrochlorothiazide (BENICAR HCT) 40-12.5 MG tablet Take 1 tablet by mouth daily.   spironolactone (ALDACTONE) 25 MG tablet Take 25 mg by mouth 2 (two) times daily.                Past Medical History:  Diagnosis Date   Hyperlipidemia    Hypertension        Objective:     Wts  03/11/2023         185  09/11/2022          180  03/12/2022        181  09/11/2021          176  03/06/2021      180   09/06/2020          168  07/28/20 164 lb (74.4 kg)  07/19/20 160 lb (72.6 kg)  05/12/20 156 lb 14.4 oz (71.2 kg)  01/26/2010       148   Vital signs reviewed  03/11/2023  - Note at rest 02 sats  94% on RA   General appearance:    pleasant amb bf nad    HEENT : wearing mask    NECK :  without  apparent JVD/ palpable Nodes/TM    LUNGS: no acc muscle use,  Min barrel  contour chest wall with bilateral  slightly decreased bs s audible wheeze and  without cough on insp or exp maneuvers and min  Hyperresonant  to  percussion bilaterally    CV:  RRR  no s3 or murmur or increase in P2, and no edema   ABD:  soft and nontender with pos end  insp Hoover's  in the supine position.  No bruits or organomegaly appreciated   MS:  Nl gait/ ext warm without deformities Or obvious joint restrictions  calf tenderness, cyanosis or clubbing     SKIN: warm and dry without lesions    NEURO:  alert,  approp, nl sensorium with  no motor or cerebellar deficits apparent.             Assessment

## 2023-03-11 ENCOUNTER — Ambulatory Visit: Payer: Medicare HMO | Admitting: Internal Medicine

## 2023-03-11 ENCOUNTER — Encounter: Payer: Self-pay | Admitting: Internal Medicine

## 2023-03-11 VITALS — BP 125/67 | HR 78 | Ht 61.0 in | Wt 185.0 lb

## 2023-03-11 DIAGNOSIS — J449 Chronic obstructive pulmonary disease, unspecified: Secondary | ICD-10-CM | POA: Diagnosis not present

## 2023-03-11 DIAGNOSIS — J302 Other seasonal allergic rhinitis: Secondary | ICD-10-CM | POA: Insufficient documentation

## 2023-03-11 NOTE — Assessment & Plan Note (Signed)
Quit smoking 03/15/2020 - PFT's  03/15/20  FEV1 0.90 (44 % ) ratio 0.54  p 3 % improvement from saba p 0 prior to study with DLCO  12.22 (84%) corrects to 3.59 (85%)  for alv volume and FV curve classic concave contour on exp portion   - 07/28/2020  After extensive coaching inhaler device,  effectiveness =    0 with hfa - 07/28/2020  After extensive coaching inhaler device,  effectiveness =    75% with elipta with short ti > try trelegy > improved 09/06/2020   - PFT's  1.13  FEV1 1.13  (59 % ) ratio 0.59  p 6 % improvement from saba p trelegy prior to study with DLCO  10.29 (59%)   and FV curve classically concave     - The proper method of use, as well as anticipated side effects, of a metered-dose inhaler were discussed and demonstrated to the patient using teach back method using empty devices    Group D (now reclassified as E) in terms of symptom/risk and laba/lama/ICS  therefore appropriate rx at this point >>>  continue trelegy  100 and approp saba

## 2023-03-11 NOTE — Patient Instructions (Signed)
For drainage / throat tickle try take CHLORPHENIRAMINE  4 mg  ("Allergy Relief" 4mg   at [facility] should be easiest to find in the blue box usually on bottom shelf)  take one every 4 hours as needed - extremely effective and inexpensive over the counter- may cause drowsiness so start with just a dose or two an hour before bedtime and see how you tolerate it before trying in daytime.    Please schedule a follow up visit in 12   months but call sooner if needed

## 2023-03-11 NOTE — Assessment & Plan Note (Signed)
03/11/2023 added 1st gen H1 blockers per guidelines  at hs prn   >>> f/u allergy prn    F/u pulmonary q year, sooner prn      Each maintenance medication was reviewed in detail including emphasizing most importantly the difference between maintenance and prns and under what circumstances the prns are to be triggered using an action plan format where appropriate.  Total time for H and P, chart review, counseling, reviewing hfa/dpi device(s) and generating customized AVS unique to this office visit / same day charting = 25 min

## 2023-03-14 ENCOUNTER — Ambulatory Visit (INDEPENDENT_AMBULATORY_CARE_PROVIDER_SITE_OTHER): Payer: Medicare Other

## 2023-03-14 DIAGNOSIS — I442 Atrioventricular block, complete: Secondary | ICD-10-CM

## 2023-03-14 LAB — CUP PACEART REMOTE DEVICE CHECK
Battery Remaining Longevity: 142 mo
Battery Voltage: 3.07 V
Brady Statistic AP VP Percent: 2.13 %
Brady Statistic AP VS Percent: 0.06 %
Brady Statistic AS VP Percent: 97.05 %
Brady Statistic AS VS Percent: 0.75 %
Brady Statistic RA Percent Paced: 2.19 %
Brady Statistic RV Percent Paced: 99.19 %
Date Time Interrogation Session: 20241106200926
Implantable Lead Connection Status: 753985
Implantable Lead Connection Status: 753985
Implantable Lead Implant Date: 20230809
Implantable Lead Implant Date: 20230809
Implantable Lead Location: 753859
Implantable Lead Location: 753860
Implantable Lead Model: 3830
Implantable Lead Model: 5076
Implantable Pulse Generator Implant Date: 20230809
Lead Channel Impedance Value: 361 Ohm
Lead Channel Impedance Value: 361 Ohm
Lead Channel Impedance Value: 494 Ohm
Lead Channel Impedance Value: 570 Ohm
Lead Channel Pacing Threshold Amplitude: 0.5 V
Lead Channel Pacing Threshold Amplitude: 1 V
Lead Channel Pacing Threshold Pulse Width: 0.4 ms
Lead Channel Pacing Threshold Pulse Width: 0.4 ms
Lead Channel Sensing Intrinsic Amplitude: 2.25 mV
Lead Channel Sensing Intrinsic Amplitude: 2.25 mV
Lead Channel Sensing Intrinsic Amplitude: 9.625 mV
Lead Channel Sensing Intrinsic Amplitude: 9.625 mV
Lead Channel Setting Pacing Amplitude: 1.5 V
Lead Channel Setting Pacing Amplitude: 2 V
Lead Channel Setting Pacing Pulse Width: 0.4 ms
Lead Channel Setting Sensing Sensitivity: 1.2 mV
Zone Setting Status: 755011

## 2023-03-25 ENCOUNTER — Encounter: Payer: Medicare HMO | Admitting: Cardiology

## 2023-03-26 NOTE — Progress Notes (Signed)
Remote pacemaker transmission.   

## 2023-03-26 NOTE — Progress Notes (Unsigned)
  Electrophysiology Office Note:   Date:  03/27/2023  ID:  ELIONA GUREVICH, DOB [birth year], MRN [id number]  Primary Cardiologist: Nona Dell, MD Electrophysiologist: Regan Lemming, MD      History of Present Illness:   Yolanda Bauer is a 77 y.o. female with h/o 2nd Degree AVB, Intermittent CHB s/p PPM, HTN, Stage IB squamous cell carcinoma of LLL seen today for routine electrophysiology followup.   Since last being seen in our clinic the patient reports she has been doing very well. She continues to work around [employer] and is active at home.  She is planning on cooking a big meal for her family for Thanksgiving. Occasionally will eat too much and gets indigestion and will take a Pepto and it goes away. After extended periods of working, she does get fatigued and will rest and go back to work.    She denies chest pain, palpitations, dyspnea, PND, orthopnea, nausea, vomiting, dizziness, syncope, edema, weight gain, or early satiety.   Review of systems complete and found to be negative unless listed in HPI.   EP Information / Studies Reviewed:    EKG is ordered today. Personal review as below.  EKG Interpretation Date/Time:  Wednesday March 27 2023 13:36:47 EST Ventricular Rate:  82 PR Interval:  206 QRS Duration:  102 QT Interval:  372 QTC Calculation: 434 R Axis:   107  Text Interpretation: Atrial-sensed ventricular-paced rhythm Confirmed by Canary Brim ([id number]) on 03/27/2023 2:02:04 PM   PPM Interrogation-  reviewed in detail today,  See PACEART report.  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 for Second Degree AV block  Studies:  ECHO 11/2021 > LVEF 60-65%, no RWMA, indetermintate diastolic parameters, mild MV regurgitation, AV mild regurgitation, AV sclerosis w/o stenosis   Arrhythmia / AAD 2nd Degree AVB / Intermittent CHB s/p PPM        Physical Exam:   VS:  BP (!) 140/68   Pulse 82   Ht 5\' 1"  (1.549 m)   Wt 180 lb 3.2 oz (81.7  kg)   SpO2 91%   BMI 34.05 kg/m    Wt Readings from Last 3 Encounters:  03/27/23 180 lb 3.2 oz (81.7 kg)  03/11/23 185 lb (83.9 kg)  12/10/22 182 lb (82.6 kg)     GEN: Well nourished, well developed in no acute distress NECK: No JVD; No carotid bruits CARDIAC: Regular rate and rhythm, no murmurs, rubs, gallops RESPIRATORY:  Clear to auscultation without rales, wheezing or rhonchi  ABDOMEN: Soft, non-tender, non-distended EXTREMITIES:  No edema; No deformity   ASSESSMENT AND PLAN:    Second Degree AV block, Intermittent CHB s/p Medtronic PPM  -Normal PPM function -See Pace Art report -No changes today  Hypertension  -elevated in clinic today, typically runs 120's/60's but she "gets nervous coming to the office" -encouraged pt follow BP, notify primary cardiology if elevated   Disposition:   Follow up with Dr. Elberta Fortis or EP APP  in 12 months  Signed, Canary Brim, MSN, APRN, NP-C, AGACNP-BC [facility] - Electrophysiology  03/27/2023, 2:09 PM

## 2023-03-27 ENCOUNTER — Ambulatory Visit: Payer: Medicare HMO | Attending: Cardiology | Admitting: Pulmonary Disease

## 2023-03-27 ENCOUNTER — Encounter: Payer: Self-pay | Admitting: Pulmonary Disease

## 2023-03-27 VITALS — BP 140/68 | HR 82 | Ht 61.0 in | Wt 180.2 lb

## 2023-03-27 DIAGNOSIS — I10 Essential (primary) hypertension: Secondary | ICD-10-CM | POA: Diagnosis not present

## 2023-03-27 DIAGNOSIS — R6889 Other general symptoms and signs: Secondary | ICD-10-CM | POA: Diagnosis not present

## 2023-03-27 DIAGNOSIS — I441 Atrioventricular block, second degree: Secondary | ICD-10-CM | POA: Diagnosis not present

## 2023-03-27 LAB — CUP PACEART INCLINIC DEVICE CHECK
Battery Remaining Longevity: 145 mo
Battery Voltage: 3.06 V
Brady Statistic AP VP Percent: 3.51 %
Brady Statistic AP VS Percent: 1.13 %
Brady Statistic AS VP Percent: 83.76 %
Brady Statistic AS VS Percent: 11.6 %
Brady Statistic RA Percent Paced: 4.67 %
Brady Statistic RV Percent Paced: 87.27 %
Date Time Interrogation Session: 20241120140604
Implantable Lead Connection Status: 753985
Implantable Lead Connection Status: 753985
Implantable Lead Implant Date: 20230809
Implantable Lead Implant Date: 20230809
Implantable Lead Location: 753859
Implantable Lead Location: 753860
Implantable Lead Model: 3830
Implantable Lead Model: 5076
Implantable Pulse Generator Implant Date: 20230809
Lead Channel Impedance Value: 380 Ohm
Lead Channel Impedance Value: 456 Ohm
Lead Channel Impedance Value: 589 Ohm
Lead Channel Impedance Value: 627 Ohm
Lead Channel Pacing Threshold Amplitude: 0.5 V
Lead Channel Pacing Threshold Amplitude: 0.75 V
Lead Channel Pacing Threshold Pulse Width: 0.4 ms
Lead Channel Pacing Threshold Pulse Width: 0.4 ms
Lead Channel Sensing Intrinsic Amplitude: 3 mV
Lead Channel Sensing Intrinsic Amplitude: 3 mV
Lead Channel Sensing Intrinsic Amplitude: 9.625 mV
Lead Channel Sensing Intrinsic Amplitude: 9.625 mV
Lead Channel Setting Pacing Amplitude: 1.5 V
Lead Channel Setting Pacing Amplitude: 2 V
Lead Channel Setting Pacing Pulse Width: 0.4 ms
Lead Channel Setting Sensing Sensitivity: 1.2 mV
Zone Setting Status: 755011

## 2023-03-27 NOTE — Patient Instructions (Signed)
 Medication Instructions:  Your physician recommends that you continue on your current medications as directed. Please refer to the Current Medication list given to you today.  *If you need a refill on your cardiac medications before your next appointment, please call your pharmacy*  Lab Work: None ordered If you have labs (blood work) drawn today and your tests are completely normal, you will receive your results only by: MyChart Message (if you have MyChart) OR A paper copy in the mail If you have any lab test that is abnormal or we need to change your treatment, we will call you to review the results.   Follow-Up: At [facility], you and your health needs are our priority.  As part of our continuing mission to provide you with exceptional heart care, we have created designated Provider Care Teams.  These Care Teams include your primary Cardiologist (physician) and Advanced Practice Providers (APPs -  Physician Assistants and Nurse Practitioners) who all work together to provide you with the care you need, when you need it.  Your next appointment:   1 year(s)  Provider:   You may see Will Jorja Loa, MD or one of the following Advanced Practice Providers on your designated Care Team:   Francis Dowse, [place] [address]" [place], [place] Sherie Don, NP Canary Brim, NP

## 2023-05-23 DIAGNOSIS — F325 Major depressive disorder, single episode, in full remission: Secondary | ICD-10-CM | POA: Diagnosis not present

## 2023-05-23 DIAGNOSIS — E669 Obesity, unspecified: Secondary | ICD-10-CM | POA: Diagnosis not present

## 2023-05-23 DIAGNOSIS — Z880 Allergy status to penicillin: Secondary | ICD-10-CM | POA: Diagnosis not present

## 2023-05-23 DIAGNOSIS — R32 Unspecified urinary incontinence: Secondary | ICD-10-CM | POA: Diagnosis not present

## 2023-05-23 DIAGNOSIS — J302 Other seasonal allergic rhinitis: Secondary | ICD-10-CM | POA: Diagnosis not present

## 2023-05-23 DIAGNOSIS — Z91199 Patient's noncompliance with other medical treatment and regimen due to unspecified reason: Secondary | ICD-10-CM | POA: Diagnosis not present

## 2023-05-23 DIAGNOSIS — M1990 Unspecified osteoarthritis, unspecified site: Secondary | ICD-10-CM | POA: Diagnosis not present

## 2023-05-23 DIAGNOSIS — I739 Peripheral vascular disease, unspecified: Secondary | ICD-10-CM | POA: Diagnosis not present

## 2023-05-23 DIAGNOSIS — I700 Atherosclerosis of aorta: Secondary | ICD-10-CM | POA: Diagnosis not present

## 2023-05-23 DIAGNOSIS — M810 Age-related osteoporosis without current pathological fracture: Secondary | ICD-10-CM | POA: Diagnosis not present

## 2023-05-23 DIAGNOSIS — I10 Essential (primary) hypertension: Secondary | ICD-10-CM | POA: Diagnosis not present

## 2023-05-23 DIAGNOSIS — Z6833 Body mass index (BMI) 33.0-33.9, adult: Secondary | ICD-10-CM | POA: Diagnosis not present

## 2023-05-23 DIAGNOSIS — E785 Hyperlipidemia, unspecified: Secondary | ICD-10-CM | POA: Diagnosis not present

## 2023-05-23 DIAGNOSIS — R609 Edema, unspecified: Secondary | ICD-10-CM | POA: Diagnosis not present

## 2023-05-23 DIAGNOSIS — Z7951 Long term (current) use of inhaled steroids: Secondary | ICD-10-CM | POA: Diagnosis not present

## 2023-05-23 DIAGNOSIS — J449 Chronic obstructive pulmonary disease, unspecified: Secondary | ICD-10-CM | POA: Diagnosis not present

## 2023-05-23 DIAGNOSIS — J9610 Chronic respiratory failure, unspecified whether with hypoxia or hypercapnia: Secondary | ICD-10-CM | POA: Diagnosis not present

## 2023-05-23 DIAGNOSIS — Z950 Presence of cardiac pacemaker: Secondary | ICD-10-CM | POA: Diagnosis not present

## 2023-05-31 DIAGNOSIS — Z6832 Body mass index (BMI) 32.0-32.9, adult: Secondary | ICD-10-CM | POA: Diagnosis not present

## 2023-05-31 DIAGNOSIS — I10 Essential (primary) hypertension: Secondary | ICD-10-CM | POA: Diagnosis not present

## 2023-05-31 DIAGNOSIS — B351 Tinea unguium: Secondary | ICD-10-CM | POA: Diagnosis not present

## 2023-06-13 ENCOUNTER — Ambulatory Visit (INDEPENDENT_AMBULATORY_CARE_PROVIDER_SITE_OTHER): Payer: Medicare Other

## 2023-06-13 DIAGNOSIS — I441 Atrioventricular block, second degree: Secondary | ICD-10-CM | POA: Diagnosis not present

## 2023-06-15 LAB — CUP PACEART REMOTE DEVICE CHECK
Battery Remaining Longevity: 141 mo
Battery Voltage: 3.04 V
Brady Statistic AP VP Percent: 0.64 %
Brady Statistic AP VS Percent: 0.01 %
Brady Statistic AS VP Percent: 98.5 %
Brady Statistic AS VS Percent: 0.85 %
Brady Statistic RA Percent Paced: 0.66 %
Brady Statistic RV Percent Paced: 99.14 %
Date Time Interrogation Session: 20250206084902
Implantable Lead Connection Status: 753985
Implantable Lead Connection Status: 753985
Implantable Lead Implant Date: 20230809
Implantable Lead Implant Date: 20230809
Implantable Lead Location: 753859
Implantable Lead Location: 753860
Implantable Lead Model: 3830
Implantable Lead Model: 5076
Implantable Pulse Generator Implant Date: 20230809
Lead Channel Impedance Value: 380 Ohm
Lead Channel Impedance Value: 418 Ohm
Lead Channel Impedance Value: 513 Ohm
Lead Channel Impedance Value: 551 Ohm
Lead Channel Pacing Threshold Amplitude: 0.375 V
Lead Channel Pacing Threshold Amplitude: 1 V
Lead Channel Pacing Threshold Pulse Width: 0.4 ms
Lead Channel Pacing Threshold Pulse Width: 0.4 ms
Lead Channel Sensing Intrinsic Amplitude: 11.25 mV
Lead Channel Sensing Intrinsic Amplitude: 11.25 mV
Lead Channel Sensing Intrinsic Amplitude: 3.125 mV
Lead Channel Sensing Intrinsic Amplitude: 3.125 mV
Lead Channel Setting Pacing Amplitude: 1.5 V
Lead Channel Setting Pacing Amplitude: 2 V
Lead Channel Setting Pacing Pulse Width: 0.4 ms
Lead Channel Setting Sensing Sensitivity: 1.2 mV
Zone Setting Status: 755011

## 2023-06-24 ENCOUNTER — Ambulatory Visit: Payer: Medicare HMO | Admitting: Podiatry

## 2023-06-24 ENCOUNTER — Encounter: Payer: Self-pay | Admitting: Podiatry

## 2023-06-24 DIAGNOSIS — M79674 Pain in right toe(s): Secondary | ICD-10-CM

## 2023-06-24 DIAGNOSIS — B351 Tinea unguium: Secondary | ICD-10-CM

## 2023-06-24 DIAGNOSIS — M79675 Pain in left toe(s): Secondary | ICD-10-CM

## 2023-06-24 NOTE — Progress Notes (Signed)
   Chief Complaint  Patient presents with   Nail Problem    Patient states she has an ingrown toe nail  on right hallux and she tired to do it on her own and her right hallux grew back thicker, and it became painful. Patient would like her toe nails trimmed down, and also her Callouses. Patient believes she has a hammer toe on left foot 2nd toe     SUBJECTIVE Patient presents to office today complaining of elongated, thickened nails that cause pain while ambulating in shoes.  Patient is unable to trim their own nails. Patient is here for further evaluation and treatment.  Past Medical History:  Diagnosis Date   Anxiety    Arthritis    Essential hypertension    Hyperlipidemia    On home oxygen therapy    Evenings   Pneumonia    Squamous cell lung cancer (HCC) 03/2020   Status post left lower lobe superior segmentectomy with lymph node dissection - Dr. Dorris Fetch    Allergies  Allergen Reactions   Penicillins Hives     OBJECTIVE General Patient is awake, alert, and oriented x 3 and in no acute distress. Derm Skin is dry and supple bilateral. Negative open lesions or macerations. Remaining integument unremarkable. Nails are tender, long, thickened and dystrophic with subungual debris, consistent with onychomycosis, 1-5 bilateral. No signs of infection noted. Vasc  DP and PT pedal pulses palpable bilaterally. Temperature gradient within normal limits.  Neuro Epicritic and protective threshold sensation grossly intact bilaterally.  Musculoskeletal Exam No symptomatic pedal deformities noted bilateral. Muscular strength within normal limits.  ASSESSMENT 1.  Pain due to onychomycosis of toenails both  PLAN OF CARE 1. Patient evaluated today.  2. Instructed to maintain good pedal hygiene and foot care.  3. Mechanical debridement of nails 1-5 bilaterally performed using a nail nipper. Filed with dremel without incident.  4. Return to clinic in 3 mos.    Felecia Shelling,  DPM [facility]  Dr. Felecia Shelling, DPM    [address] [place], [place] [address]                Office [phone]  Fax [phone]

## 2023-07-18 NOTE — Progress Notes (Signed)
 Remote pacemaker transmission.

## 2023-07-18 NOTE — Addendum Note (Signed)
 Addended by: Elease Etienne A on: 07/18/2023 09:19 AM   Modules accepted: Orders

## 2023-07-24 DIAGNOSIS — H04123 Dry eye syndrome of bilateral lacrimal glands: Secondary | ICD-10-CM | POA: Diagnosis not present

## 2023-09-12 ENCOUNTER — Ambulatory Visit (INDEPENDENT_AMBULATORY_CARE_PROVIDER_SITE_OTHER): Payer: Medicare Other

## 2023-09-12 DIAGNOSIS — I441 Atrioventricular block, second degree: Secondary | ICD-10-CM | POA: Diagnosis not present

## 2023-09-13 LAB — CUP PACEART REMOTE DEVICE CHECK
Battery Remaining Longevity: 137 mo
Battery Voltage: 3.04 V
Brady Statistic AP VP Percent: 1.37 %
Brady Statistic AP VS Percent: 0.23 %
Brady Statistic AS VP Percent: 94.71 %
Brady Statistic AS VS Percent: 3.69 %
Brady Statistic RA Percent Paced: 1.64 %
Brady Statistic RV Percent Paced: 96.09 %
Date Time Interrogation Session: 20250508085658
Implantable Lead Connection Status: 753985
Implantable Lead Connection Status: 753985
Implantable Lead Implant Date: 20230809
Implantable Lead Implant Date: 20230809
Implantable Lead Location: 753859
Implantable Lead Location: 753860
Implantable Lead Model: 3830
Implantable Lead Model: 5076
Implantable Pulse Generator Implant Date: 20230809
Lead Channel Impedance Value: 380 Ohm
Lead Channel Impedance Value: 418 Ohm
Lead Channel Impedance Value: 494 Ohm
Lead Channel Impedance Value: 551 Ohm
Lead Channel Pacing Threshold Amplitude: 0.375 V
Lead Channel Pacing Threshold Amplitude: 0.875 V
Lead Channel Pacing Threshold Pulse Width: 0.4 ms
Lead Channel Pacing Threshold Pulse Width: 0.4 ms
Lead Channel Sensing Intrinsic Amplitude: 3.125 mV
Lead Channel Sensing Intrinsic Amplitude: 3.125 mV
Lead Channel Sensing Intrinsic Amplitude: 7.625 mV
Lead Channel Sensing Intrinsic Amplitude: 7.625 mV
Lead Channel Setting Pacing Amplitude: 1.5 V
Lead Channel Setting Pacing Amplitude: 2 V
Lead Channel Setting Pacing Pulse Width: 0.4 ms
Lead Channel Setting Sensing Sensitivity: 1.2 mV
Zone Setting Status: 755011

## 2023-09-23 ENCOUNTER — Ambulatory Visit: Payer: Self-pay | Admitting: Cardiology

## 2023-10-18 NOTE — Progress Notes (Signed)
 Remote pacemaker transmission.

## 2023-10-18 NOTE — Addendum Note (Signed)
 Addended by: Lott Rouleau A on: 10/18/2023 11:57 AM   Modules accepted: Orders

## 2023-10-30 ENCOUNTER — Other Ambulatory Visit: Payer: Self-pay | Admitting: Internal Medicine

## 2023-11-14 ENCOUNTER — Ambulatory Visit (HOSPITAL_COMMUNITY)
Admission: RE | Admit: 2023-11-14 | Discharge: 2023-11-14 | Disposition: A | Discharge status: Home / Self Care | Source: Ambulatory Visit | Attending: Internal Medicine | Admitting: Internal Medicine

## 2023-11-14 ENCOUNTER — Inpatient Hospital Stay: Payer: Medicare HMO | Attending: Internal Medicine

## 2023-11-14 DIAGNOSIS — Z08 Encounter for follow-up examination after completed treatment for malignant neoplasm: Secondary | ICD-10-CM | POA: Insufficient documentation

## 2023-11-14 DIAGNOSIS — Z85118 Personal history of other malignant neoplasm of bronchus and lung: Secondary | ICD-10-CM | POA: Diagnosis not present

## 2023-11-14 DIAGNOSIS — I700 Atherosclerosis of aorta: Secondary | ICD-10-CM | POA: Diagnosis not present

## 2023-11-14 DIAGNOSIS — C3490 Malignant neoplasm of unspecified part of unspecified bronchus or lung: Secondary | ICD-10-CM | POA: Diagnosis not present

## 2023-11-14 DIAGNOSIS — J439 Emphysema, unspecified: Secondary | ICD-10-CM | POA: Diagnosis not present

## 2023-11-14 DIAGNOSIS — E049 Nontoxic goiter, unspecified: Secondary | ICD-10-CM | POA: Diagnosis not present

## 2023-11-14 LAB — CBC WITH DIFFERENTIAL (CANCER CENTER ONLY)
Abs Immature Granulocytes: 0.01 K/uL (ref 0.00–0.07)
Basophils Absolute: 0 K/uL (ref 0.0–0.1)
Basophils Relative: 1 %
Eosinophils Absolute: 0.2 K/uL (ref 0.0–0.5)
Eosinophils Relative: 4 %
HCT: 34.8 % — ABNORMAL LOW (ref 36.0–46.0)
Hemoglobin: 11.4 g/dL — ABNORMAL LOW (ref 12.0–15.0)
Immature Granulocytes: 0 %
Lymphocytes Relative: 28 %
Lymphs Abs: 1.4 K/uL (ref 0.7–4.0)
MCH: 28.6 pg (ref 26.0–34.0)
MCHC: 32.8 g/dL (ref 30.0–36.0)
MCV: 87.4 fL (ref 80.0–100.0)
Monocytes Absolute: 0.5 K/uL (ref 0.1–1.0)
Monocytes Relative: 10 %
Neutro Abs: 2.8 K/uL (ref 1.7–7.7)
Neutrophils Relative %: 57 %
Platelet Count: 308 K/uL (ref 150–400)
RBC: 3.98 MIL/uL (ref 3.87–5.11)
RDW: 15.3 % (ref 11.5–15.5)
WBC Count: 4.9 K/uL (ref 4.0–10.5)
nRBC: 0 % (ref 0.0–0.2)

## 2023-11-14 LAB — CMP (CANCER CENTER ONLY)
ALT: 13 U/L (ref 0–44)
AST: 18 U/L (ref 15–41)
Albumin: 4 g/dL (ref 3.5–5.0)
Alkaline Phosphatase: 67 U/L (ref 38–126)
Anion gap: 6 (ref 5–15)
BUN: 26 mg/dL — ABNORMAL HIGH (ref 8–23)
CO2: 30 mmol/L (ref 22–32)
Calcium: 9.9 mg/dL (ref 8.9–10.3)
Chloride: 106 mmol/L (ref 98–111)
Creatinine: 0.96 mg/dL (ref 0.44–1.00)
GFR, Estimated: >60 mL/min (ref >60)
Glucose, Bld: 103 mg/dL — ABNORMAL HIGH (ref 70–99)
Potassium: 4.1 mmol/L (ref 3.5–5.1)
Sodium: 142 mmol/L (ref 135–145)
Total Bilirubin: 0.3 mg/dL (ref 0.0–1.2)
Total Protein: 7.4 g/dL (ref 6.5–8.1)

## 2023-11-14 MED ORDER — IOHEXOL 300 MG/ML  SOLN
75.0000 mL | Freq: Once | INTRAMUSCULAR | Status: AC | PRN
  Administered 2023-11-14: 75 mL via INTRAVENOUS

## 2023-11-19 ENCOUNTER — Inpatient Hospital Stay: Payer: Medicare HMO | Admitting: Internal Medicine

## 2023-11-19 VITALS — BP 136/60 | HR 81 | Temp 97.3°F | Resp 17 | Ht 61.0 in | Wt 177.0 lb

## 2023-11-19 DIAGNOSIS — Z08 Encounter for follow-up examination after completed treatment for malignant neoplasm: Secondary | ICD-10-CM | POA: Diagnosis not present

## 2023-11-19 DIAGNOSIS — Z85118 Personal history of other malignant neoplasm of bronchus and lung: Secondary | ICD-10-CM | POA: Diagnosis not present

## 2023-11-19 DIAGNOSIS — C3490 Malignant neoplasm of unspecified part of unspecified bronchus or lung: Secondary | ICD-10-CM | POA: Diagnosis not present

## 2023-11-19 NOTE — Progress Notes (Signed)
 [facility] Telephone:([phone]) [phone]   Fax:([phone]) [phone]  OFFICE PROGRESS NOTE  Job Bolt, [place] [address] [place] [address]  DIAGNOSIS: Stage IB (T2 a, N0, M0) non-small cell lung cancer, squamous cell carcinoma diagnosed in November 2021.  PRIOR THERAPY: status post left lower lobe superior segmentectomy with lymph node dissection under the care of Dr. Kerrin on March 12, 2020 and the final size of the tumor was 3.9 cm with visceral pleural invasion  CURRENT THERAPY: Observation.  INTERVAL HISTORY: Yolanda Bauer 78 y.o. female returns to the clinic today for her annual follow-up visit.Discussed the use of AI scribe software for clinical note transcription with the patient, who gave verbal consent to proceed.  History of Present Illness Yolanda Bauer is a 78 year old female with stage 1B non-small cell lung cancer who presents for evaluation and repeat CT scan of the chest for restaging of her disease.  She has a history of stage 1B non-small cell lung cancer, squamous cell carcinoma, diagnosed in November 2021. She underwent a left lower lobe superior segmentectomy with lymph node dissection and has been on observation since then.  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 She can walk 'fifteen or twenty steps' but not much more. No chest pain or breathing issues.  She experiences occasional indigestion, described as 'beats in my chest sometimes,' which she manages with acid reflux tablets. She sometimes struggles with deciding what to eat.  A thyroid  nodule was mentioned, which she did not fully understand, but it has been present on her scans.     MEDICAL HISTORY: Past Medical History:  Diagnosis Date   Anxiety    Arthritis    Essential hypertension    Hyperlipidemia    On home oxygen  therapy    Evenings   Pneumonia    Squamous cell lung cancer (HCC) 03/2020   Status post left lower  lobe superior segmentectomy with lymph node dissection - Dr. Kerrin    ALLERGIES:  is allergic to penicillins.  MEDICATIONS:  Current Outpatient Medications  Medication Sig Dispense Refill   ABRYSVO 120 MCG/0.5ML injection      acetaminophen  (TYLENOL ) 325 MG tablet Take 650 mg by mouth every 6 (six) hours as needed for moderate pain.     albuterol  (VENTOLIN  HFA) 108 (90 Base) MCG/ACT inhaler Inhale 2 puffs into the lungs every 6 (six) hours as needed for wheezing. 8 g 6   amLODipine  (NORVASC ) 5 MG tablet Take 1 tablet (5 mg total) by mouth daily. 90 tablet 3   Ascorbic Acid  (VITAMIN C) 500 MG tablet Take 500 mg by mouth every other day.     aspirin  81 MG tablet Take 1 tablet (81 mg total) by mouth daily. 30 tablet    atorvastatin  (LIPITOR) 40 MG tablet Take 40 mg by mouth daily.     bismuth  subsalicylate (PEPTO BISMOL) 262 MG/15ML suspension Take 30 mLs by mouth every 6 (six) hours as needed for indigestion.     cetirizine (ZYRTEC) 10 MG tablet Take 10 mg by mouth daily.     Coral Calcium  1000 (390 CA) MG TABS Take 1,000 mg by mouth every other day.     fluticasone  (FLONASE ) 50 MCG/ACT nasal spray Place 1 spray into both nostrils daily as needed for allergies.      Fluticasone -Umeclidin-Vilant (TRELEGY ELLIPTA ) 100-62.5-25 MCG/ACT AEPB INHALE 1 PUFF EVERY MORNING 180 each 3  Glycerin-Hypromellose-PEG 400 (DRY EYE RELIEF DROPS OP) Place 1 drop into both eyes every other day.     guaiFENesin  (MUCINEX ) 600 MG 12 hr tablet Take 600 mg by mouth as needed for to loosen phlegm.     Multiple Vitamin (MULTIVITAMIN) tablet Take 1 tablet by mouth every other day.     olmesartan -hydrochlorothiazide  (BENICAR  HCT) 40-12.5 MG tablet Take 1 tablet by mouth daily.     spironolactone  (ALDACTONE ) 25 MG tablet Take 25 mg by mouth 2 (two) times daily.     No current facility-administered medications for this visit.    SURGICAL HISTORY:  Past Surgical History:  Procedure Laterality Date   BACK  SURGERY     CATARACT EXTRACTION, BILATERAL Bilateral    COLONOSCOPY WITH PROPOFOL  N/A 11/21/2020   Procedure: COLONOSCOPY WITH PROPOFOL ;  Surgeon: Cindie Carlin POUR, DO;  Location: [facility];  Service: Endoscopy;  Laterality: N/A;  ASA III / 11:00   HERNIA REPAIR     INTERCOSTAL NERVE BLOCK Left 04/11/2020   Procedure: INTERCOSTAL NERVE BLOCK;  Surgeon: Kerrin Elspeth BROCKS, MD;  Location: [facility] OR;  Service: Thoracic;  Laterality: Left;   NODE DISSECTION Left 04/11/2020   Procedure: NODE DISSECTION;  Surgeon: Kerrin Elspeth BROCKS, MD;  Location: [facility] OR;  Service: Thoracic;  Laterality: Left;   PACEMAKER IMPLANT N/A 12/13/2021   Procedure: PACEMAKER IMPLANT;  Surgeon: Inocencio Soyla Lunger, MD;  Location: [facility] INVASIVE CV LAB;  Service: Cardiovascular;  Laterality: N/A;   POLYPECTOMY  11/21/2020   Procedure: POLYPECTOMY;  Surgeon: Cindie Carlin POUR, DO;  Location: [facility];  Service: Endoscopy;;   TUBAL LIGATION     XI ROBOTIC ASSISTED THORACOSCOPY- SEGMENTECTOMY Left 04/11/2020   Procedure: XI ROBOTIC ASSISTED THORACOSCOPY-LEFT LOWER LOBE SUPERIOR SEGMENTECTOMY;  Surgeon: Kerrin Elspeth BROCKS, MD;  Location: [facility] OR;  Service: Thoracic;  Laterality: Left;    REVIEW OF SYSTEMS:  A comprehensive review of systems was negative except for: Gastrointestinal: positive for dyspepsia   PHYSICAL EXAMINATION: General appearance: alert, cooperative, and no distress Head: Normocephalic, without obvious abnormality, atraumatic Neck: no adenopathy, no JVD, supple, symmetrical, trachea midline, and thyroid  not enlarged, symmetric, no tenderness/mass/nodules Lymph nodes: Cervical, supraclavicular, and axillary nodes normal. Resp: clear to auscultation bilaterally Back: symmetric, no curvature. ROM normal. No CVA tenderness. Cardio: regular rate and rhythm, S1, S2 normal, no murmur, click, rub or gallop GI: soft, non-tender; bowel sounds normal; no masses,  no organomegaly Extremities: extremities normal,  atraumatic, no cyanosis or edema  ECOG PERFORMANCE STATUS: 1 - Symptomatic but completely ambulatory  Blood pressure 136/60, pulse 81, temperature (!) 97.3 F (36.3 C), temperature source Temporal, resp. rate 17, height 5' 1 (1.549 m), weight 177 lb (80.3 kg), SpO2 100%.  LABORATORY DATA: Lab Results  Component Value Date   WBC 4.9 11/14/2023   HGB 11.4 (L) 11/14/2023   HCT 34.8 (L) 11/14/2023   MCV 87.4 11/14/2023   PLT 308 11/14/2023      Chemistry      Component Value Date/Time   NA 142 11/14/2023 0949   K 4.1 11/14/2023 0949   CL 106 11/14/2023 0949   CO2 30 11/14/2023 0949   BUN 26 (H) 11/14/2023 0949   CREATININE 0.96 11/14/2023 0949      Component Value Date/Time   CALCIUM  9.9 11/14/2023 0949   ALKPHOS 67 11/14/2023 0949   AST 18 11/14/2023 0949   ALT 13 11/14/2023 0949   BILITOT 0.3 11/14/2023 0949       RADIOGRAPHIC STUDIES: CT  Chest W Contrast Result Date: 11/15/2023 CLINICAL DATA:  Non-small cell lung cancer, staging. * Tracking Code: BO *. EXAM: CT CHEST WITH CONTRAST TECHNIQUE: Multidetector CT imaging of the chest was performed during intravenous contrast administration. RADIATION DOSE REDUCTION: This exam was performed according to the departmental dose-optimization program which includes automated exposure control, adjustment of the mA and/or kV according to patient size and/or use of iterative reconstruction technique. CONTRAST:  75mL OMNIPAQUE  IOHEXOL  300 MG/ML  SOLN COMPARISON:  Multiple priors including CT November 19, 2002 FINDINGS: Cardiovascular: Extensive aortic atherosclerosis. Left chest cardiac generator with leads in the right atrium and right ventricle. Coronary artery calcifications. Normal size heart. Mediastinum/Nodes: Lobular enlargement of the thyroid  gland with a discrete 14 mm hypodense left thyroid  nodule. No pathologically enlarged mediastinal, hilar or axillary lymph nodes. Lungs/Pleura: Surgical changes of prior left lower lobectomy without  new suspicious nodularity along the suture line. No new suspicious pulmonary nodules or masses. Biapical pleuroparenchymal scarring. Emphysema.  Scattered atelectasis/scarring. Upper Abdomen: No acute abnormality. Musculoskeletal: No aggressive lytic or blastic lesion of bone. Multilevel degenerative changes spine. IMPRESSION: 1. Surgical changes of prior left lower lobectomy without evidence of local recurrence or metastatic disease in the chest. 2. Lobular enlargement of the thyroid  gland with a discrete 14 mm hypodense left thyroid  nodule. 3. Aortic Atherosclerosis (ICD10-I70.0) and Emphysema (ICD10-J43.9). Electronically Signed   By: Reyes Holder M.D.   On: 11/15/2023 15:07    ASSESSMENT AND PLAN: This is a very pleasant 78 years old African-American female diagnosed with a stage Ib (T2 a, N0, M0) non-small cell lung cancer, squamous cell carcinoma status post left lower lobe superior segmentectomy with lymph node dissection under the care of Dr. Kerrin on March 12, 2020 with tumor size of 3.9 cm and visceropleural involvement. The patient is currently on observation and she is feeling fine with no concerning complaints. She had repeat CT scan of the chest performed recently.  I personally independently reviewed the scan and discussed the result with the patient today.  Her scan showed no concerning findings for disease recurrence or metastasis. Assessment and Plan Assessment & Plan Stage 1B non-small cell lung cancer, squamous cell carcinoma Diagnosed in November 2021, status post left lower lobe superior sigmoidectomy with lymph node dissection. Currently no evidence of disease progression on recent CT scan. - Continue observation - Schedule follow-up in one year  Thyroid  nodule Presence of a thyroid  nodule, likely benign. Discussed further evaluation with ultrasound to assess for malignancy. If ultrasound is suspicious, a fine needle aspiration biopsy may be warranted. If not  suspicious, the nodule will be monitored. - Order thyroid  ultrasound - Proceed with fine needle aspiration biopsy if ultrasound is suspicious - Monitor if ultrasound is not suspicious  Gastroesophageal reflux disease (GERD) Intermittent symptoms of acid reflux, managed with acid reflux tablets. Symptoms include chest discomfort after eating certain foods. The patient was advised to call immediately if she has any concerning symptoms in the interval. The patient voices understanding of current disease status and treatment options and is in agreement with the current care plan.  All questions were answered. The patient knows to call the clinic with any problems, questions or concerns. We can certainly see the patient much sooner if necessary.   Disclaimer: This note was dictated with voice recognition software. Similar sounding words can inadvertently be transcribed and may not be corrected upon review.

## 2023-11-20 ENCOUNTER — Telehealth: Payer: Self-pay | Admitting: Internal Medicine

## 2023-11-20 NOTE — Telephone Encounter (Signed)
Scheduled appointments with the patient

## 2023-11-26 DIAGNOSIS — R7989 Other specified abnormal findings of blood chemistry: Secondary | ICD-10-CM | POA: Diagnosis not present

## 2023-11-26 DIAGNOSIS — I10 Essential (primary) hypertension: Secondary | ICD-10-CM | POA: Diagnosis not present

## 2023-11-26 DIAGNOSIS — R5383 Other fatigue: Secondary | ICD-10-CM | POA: Diagnosis not present

## 2023-11-26 DIAGNOSIS — E7849 Other hyperlipidemia: Secondary | ICD-10-CM | POA: Diagnosis not present

## 2023-11-26 DIAGNOSIS — Z1329 Encounter for screening for other suspected endocrine disorder: Secondary | ICD-10-CM | POA: Diagnosis not present

## 2023-11-28 ENCOUNTER — Other Ambulatory Visit: Payer: Self-pay | Admitting: Physician Assistant

## 2023-12-04 DIAGNOSIS — E782 Mixed hyperlipidemia: Secondary | ICD-10-CM | POA: Diagnosis not present

## 2023-12-04 DIAGNOSIS — J309 Allergic rhinitis, unspecified: Secondary | ICD-10-CM | POA: Diagnosis not present

## 2023-12-04 DIAGNOSIS — E7849 Other hyperlipidemia: Secondary | ICD-10-CM | POA: Diagnosis not present

## 2023-12-04 DIAGNOSIS — Z6832 Body mass index (BMI) 32.0-32.9, adult: Secondary | ICD-10-CM | POA: Diagnosis not present

## 2023-12-04 DIAGNOSIS — I10 Essential (primary) hypertension: Secondary | ICD-10-CM | POA: Diagnosis not present

## 2023-12-12 ENCOUNTER — Ambulatory Visit: Payer: Medicare Other

## 2023-12-12 ENCOUNTER — Encounter: Payer: Self-pay | Admitting: Nurse Practitioner

## 2023-12-12 ENCOUNTER — Ambulatory Visit: Attending: Nurse Practitioner | Admitting: Nurse Practitioner

## 2023-12-12 VITALS — BP 130/70 | HR 75 | Ht 61.0 in | Wt 178.2 lb

## 2023-12-12 DIAGNOSIS — E785 Hyperlipidemia, unspecified: Secondary | ICD-10-CM

## 2023-12-12 DIAGNOSIS — Z950 Presence of cardiac pacemaker: Secondary | ICD-10-CM | POA: Diagnosis not present

## 2023-12-12 DIAGNOSIS — J449 Chronic obstructive pulmonary disease, unspecified: Secondary | ICD-10-CM

## 2023-12-12 DIAGNOSIS — I441 Atrioventricular block, second degree: Secondary | ICD-10-CM

## 2023-12-12 DIAGNOSIS — I442 Atrioventricular block, complete: Secondary | ICD-10-CM

## 2023-12-12 DIAGNOSIS — I10 Essential (primary) hypertension: Secondary | ICD-10-CM | POA: Diagnosis not present

## 2023-12-12 NOTE — Patient Instructions (Signed)

## 2023-12-12 NOTE — Progress Notes (Signed)
 Cardiology Office Note   Date:  12/12/2023 ID:  Latyra, Jaye [birth year], MRN [id number] PCP: Job Bolt, PA  [facility] Providers Cardiologist:  Jayson Sierras, MD Electrophysiologist:  Soyla Gladis Norton, MD     History of Present Illness Yolanda Bauer is a 79 y.o. female with a PMH of second-degree AV block, intermittent complete heart block, s/p PPM, hypertension, HLD, history of lung cancer, COPD, who presents today for 1 year follow-up. Followed by electrophysiology.  Last seen by Olivia Pavy, PA-C on December 10, 2022.  She was overall doing well at the time.  Did admit to some dyspnea on exertion of walking and extreme heat.  Reported using O2 at nighttime.  Sadly, daughter passed away of lupus in 07/23/2022.  She was overall feeling good.  Today she presents for 1 year follow-up. She is doing very well. Denies any acute cardiac complaints or issues. Denies any chest pain, shortness of breath, palpitations, syncope, presyncope, dizziness, orthopnea, PND, swelling or significant weight changes, acute bleeding, or claudication.  ROS: Negative. See HPI.   Studies Reviewed  EKG:  EKG Interpretation Date/Time:  Thursday December 12 2023 09:52:43 EDT Ventricular Rate:  73 PR Interval:  202 QRS Duration:  106 QT Interval:  390 QTC Calculation: 429 R Axis:   91  Text Interpretation: Atrial-sensed ventricular-paced rhythm When compared with ECG of 27-Mar-2023 13:36, Vent. rate has decreased BY   9 BPM Confirmed by Miriam Norris [phone]) on 12/12/2023 9:57:13 AM   Echo 11/2021:  1. Left ventricular ejection fraction, by estimation, is 60 to 65%. The  left ventricle has normal function. The left ventricle has no regional  wall motion abnormalities. Left ventricular diastolic parameters are  indeterminate.   2. Right ventricular systolic function is normal. The right ventricular  size is normal.   3. The mitral valve is abnormal. Mild to moderate mitral valve   regurgitation. No evidence of mitral stenosis.   4. The aortic valve is tricuspid. There is mild calcification of the  aortic valve. There is mild thickening of the aortic valve. Aortic valve  regurgitation is mild. Aortic valve sclerosis is present, with no evidence  of aortic valve stenosis.   5. The inferior vena cava is normal in size with greater than 50%  respiratory variability, suggesting right atrial pressure of 3 mmHg.  Risk Assessment/Calculations     The 10-year ASCVD risk score (Arnett DK, et al., 2019) is: 15%   Values used to calculate the score:     Age: 78 years     Clincally relevant sex: Female     Is Non-Hispanic African American: Yes     Diabetic: No     Tobacco smoker: No     Systolic Blood Pressure: 130 mmHg     Is BP treated: Yes     HDL Cholesterol: 73 mg/dL     Total Cholesterol: 169 mg/dL  Physical Exam VS:  BP 130/70   Pulse 75   Ht 5' 1 (1.549 m)   Wt 178 lb 3.2 oz (80.8 kg)   SpO2 95%   BMI 33.67 kg/m        Wt Readings from Last 3 Encounters:  12/12/23 178 lb 3.2 oz (80.8 kg)  11/19/23 177 lb (80.3 kg)  03/27/23 180 lb 3.2 oz (81.7 kg)    GEN: Well nourished, well developed in no acute distress NECK: No JVD; No carotid bruits CARDIAC: S1/S2, RRR, no murmurs, rubs, gallops RESPIRATORY:  Clear to  auscultation without rales, wheezing or rhonchi  ABDOMEN: Soft, non-tender, non-distended EXTREMITIES:  No edema; No deformity   ASSESSMENT AND PLAN  Second-degree AV block, intermittent complete heart block, s/p PPM Denies any issues.  Followed by EP.  Most recent remote device check showed normal device function.  Continue follow-up with EP.   Hypertension Blood pressure stable and at goal. Discussed to monitor BP at home at least 2 hours after medications and sitting for 5-10 minutes.  No medication changes at this time. Heart healthy diet and regular cardiovascular exercise encouraged.   HLD LDL from last year was 89.  Being managed by  PCP.  Continue atorvastatin . Heart healthy diet and regular cardiovascular exercise encouraged.   COPD Denies any recent shortness of breath or acute exacerbations.  She continue to wear O2 at night.  Continue follow-up with PCP and Pulmonology.   I spent a total duration of 20 minutes reviewing prior notes, reviewing outside records including  labs, EKG today, face-to-face counseling of medical condition, pathophysiology, evaluation, management, and documenting the findings in the note.    Dispo: Follow-up with MD/APP in 1 year or sooner breathing changes.  Signed, Almarie Crate, NP

## 2023-12-13 LAB — CUP PACEART REMOTE DEVICE CHECK
Battery Remaining Longevity: 135 mo
Battery Voltage: 3.03 V
Brady Statistic AP VP Percent: 0.74 %
Brady Statistic AP VS Percent: 0.15 %
Brady Statistic AS VP Percent: 91.97 %
Brady Statistic AS VS Percent: 7.13 %
Brady Statistic RA Percent Paced: 0.97 %
Brady Statistic RV Percent Paced: 92.72 %
Date Time Interrogation Session: 20250807193825
Implantable Lead Connection Status: 753985
Implantable Lead Connection Status: 753985
Implantable Lead Implant Date: 20230809
Implantable Lead Implant Date: 20230809
Implantable Lead Location: 753859
Implantable Lead Location: 753860
Implantable Lead Model: 3830
Implantable Lead Model: 5076
Implantable Pulse Generator Implant Date: 20230809
Lead Channel Impedance Value: 361 Ohm
Lead Channel Impedance Value: 437 Ohm
Lead Channel Impedance Value: 456 Ohm
Lead Channel Impedance Value: 570 Ohm
Lead Channel Pacing Threshold Amplitude: 0.375 V
Lead Channel Pacing Threshold Amplitude: 0.875 V
Lead Channel Pacing Threshold Pulse Width: 0.4 ms
Lead Channel Pacing Threshold Pulse Width: 0.4 ms
Lead Channel Sensing Intrinsic Amplitude: 3.75 mV
Lead Channel Sensing Intrinsic Amplitude: 3.75 mV
Lead Channel Sensing Intrinsic Amplitude: 9.5 mV
Lead Channel Sensing Intrinsic Amplitude: 9.5 mV
Lead Channel Setting Pacing Amplitude: 1.5 V
Lead Channel Setting Pacing Amplitude: 2 V
Lead Channel Setting Pacing Pulse Width: 0.4 ms
Lead Channel Setting Sensing Sensitivity: 1.2 mV
Zone Setting Status: 755011

## 2023-12-16 ENCOUNTER — Ambulatory Visit: Payer: Self-pay | Admitting: Cardiology

## 2023-12-23 ENCOUNTER — Other Ambulatory Visit: Payer: Self-pay | Admitting: Internal Medicine

## 2023-12-23 ENCOUNTER — Other Ambulatory Visit: Payer: Self-pay | Admitting: Medical Oncology

## 2023-12-23 DIAGNOSIS — E041 Nontoxic single thyroid nodule: Secondary | ICD-10-CM

## 2023-12-30 ENCOUNTER — Ambulatory Visit (HOSPITAL_COMMUNITY)
Admission: RE | Admit: 2023-12-30 | Discharge: 2023-12-30 | Disposition: A | Discharge status: Home / Self Care | Source: Ambulatory Visit | Attending: Internal Medicine | Admitting: Internal Medicine

## 2023-12-30 ENCOUNTER — Ambulatory Visit (HOSPITAL_COMMUNITY): Admission: RE | Admit: 2023-12-30 | Source: Ambulatory Visit

## 2023-12-30 DIAGNOSIS — E041 Nontoxic single thyroid nodule: Secondary | ICD-10-CM | POA: Diagnosis not present

## 2023-12-30 DIAGNOSIS — E042 Nontoxic multinodular goiter: Secondary | ICD-10-CM | POA: Diagnosis not present

## 2024-01-14 DIAGNOSIS — Z1231 Encounter for screening mammogram for malignant neoplasm of breast: Secondary | ICD-10-CM | POA: Diagnosis not present

## 2024-01-27 DIAGNOSIS — R928 Other abnormal and inconclusive findings on diagnostic imaging of breast: Secondary | ICD-10-CM | POA: Diagnosis not present

## 2024-01-27 DIAGNOSIS — R921 Mammographic calcification found on diagnostic imaging of breast: Secondary | ICD-10-CM | POA: Diagnosis not present

## 2024-01-27 DIAGNOSIS — Z23 Encounter for immunization: Secondary | ICD-10-CM | POA: Diagnosis not present

## 2024-02-01 NOTE — Progress Notes (Signed)
 Remote PPM Transmission

## 2024-02-15 ENCOUNTER — Other Ambulatory Visit: Payer: Self-pay | Admitting: Physician Assistant

## 2024-03-12 ENCOUNTER — Encounter: Payer: Self-pay | Admitting: Internal Medicine

## 2024-03-12 ENCOUNTER — Ambulatory Visit (INDEPENDENT_AMBULATORY_CARE_PROVIDER_SITE_OTHER): Payer: Medicare Other

## 2024-03-12 ENCOUNTER — Ambulatory Visit: Admitting: Internal Medicine

## 2024-03-12 VITALS — BP 126/64 | HR 84 | Ht 61.0 in | Wt 178.0 lb

## 2024-03-12 DIAGNOSIS — J9612 Chronic respiratory failure with hypercapnia: Secondary | ICD-10-CM | POA: Diagnosis not present

## 2024-03-12 DIAGNOSIS — Z87891 Personal history of nicotine dependence: Secondary | ICD-10-CM

## 2024-03-12 DIAGNOSIS — I441 Atrioventricular block, second degree: Secondary | ICD-10-CM

## 2024-03-12 DIAGNOSIS — J449 Chronic obstructive pulmonary disease, unspecified: Secondary | ICD-10-CM | POA: Diagnosis not present

## 2024-03-12 DIAGNOSIS — J9611 Chronic respiratory failure with hypoxia: Secondary | ICD-10-CM

## 2024-03-12 LAB — CUP PACEART REMOTE DEVICE CHECK
Battery Remaining Longevity: 131 mo
Battery Voltage: 3.03 V
Brady Statistic AP VP Percent: 0.31 %
Brady Statistic AP VS Percent: 0.12 %
Brady Statistic AS VP Percent: 88.78 %
Brady Statistic AS VS Percent: 10.8 %
Brady Statistic RA Percent Paced: 0.49 %
Brady Statistic RV Percent Paced: 89.09 %
Date Time Interrogation Session: 20251105201324
Implantable Lead Connection Status: 753985
Implantable Lead Connection Status: 753985
Implantable Lead Implant Date: 20230809
Implantable Lead Implant Date: 20230809
Implantable Lead Location: 753859
Implantable Lead Location: 753860
Implantable Lead Model: 3830
Implantable Lead Model: 5076
Implantable Pulse Generator Implant Date: 20230809
Lead Channel Impedance Value: 361 Ohm
Lead Channel Impedance Value: 418 Ohm
Lead Channel Impedance Value: 437 Ohm
Lead Channel Impedance Value: 551 Ohm
Lead Channel Pacing Threshold Amplitude: 0.375 V
Lead Channel Pacing Threshold Amplitude: 0.875 V
Lead Channel Pacing Threshold Pulse Width: 0.4 ms
Lead Channel Pacing Threshold Pulse Width: 0.4 ms
Lead Channel Sensing Intrinsic Amplitude: 3.75 mV
Lead Channel Sensing Intrinsic Amplitude: 3.75 mV
Lead Channel Sensing Intrinsic Amplitude: 7.75 mV
Lead Channel Sensing Intrinsic Amplitude: 7.75 mV
Lead Channel Setting Pacing Amplitude: 1.5 V
Lead Channel Setting Pacing Amplitude: 2 V
Lead Channel Setting Pacing Pulse Width: 0.4 ms
Lead Channel Setting Sensing Sensitivity: 1.2 mV
Zone Setting Status: 755011

## 2024-03-12 NOTE — Progress Notes (Unsigned)
 Yolanda Bauer, female    DOB: [birth year]    MRN: [id number]   Brief patient profile:  19 yobf quit smoking 03/2020 with hbp on ACEi with noct wheeze on prn albuterol  helped some referred to pulmonary clinic in [place]  01/27/2020 by Dr Cena Buttner PA re SPN  LLL sup segment.    History of Present Illness  01/27/2020  Pulmonary/ 1st office eval/Jacarie Pate  Chief Complaint  Patient presents with   Consult    productive cough with white phlegm in the mornings  Dyspnea:  No steps at her home but Not limited by breathing from desired activities   Cough: some am's / no hemoptysis/ some wheeze at hs  Sleep: bed is flat/ one pillow  SABA use: none 02 none   rec Stop lisinopril and instead take olmesartan  20- 12.5 one daily  The key is to stop smoking completely before smoking completely stops you!   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   07/28/2020  f/u ov/[place] office/Devine Dant re: s/p L sup  Segmentectomy  Chief Complaint  Patient presents with   Follow-up    Patient states has shortness of breath sometimes but not as much as she used to   Essential hypertension D/c acei 01/27/2020 due to report of noct wheeze   Dyspnea: able to shop [employer] sometimes s 02 /can't name one activity  Cough: nasal congestion  Sleeping: R side down/ bed is flat/ 2 pillows  SABA use: saba rarely  02: 2 lpm  Covid status: vax x 3 Lung cancer screening: n/a  rec Plan A = Automatic = Always=    Trelegy 100 each am  Work on inhaler technique:   Plan B = Backup (to supplement plan A, not to replace it) Only use your albuterol  inhaler as a rescue medication  Continue 02 is 2lpm at bedtime and then during the day keep over  90%  Make sure you check your oxygen  saturation  at your highest level of activity  to be sure it stays over 90%     09/11/2021  f/u ov/[place] office/Shahram Alexopoulos re: 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 Feels breathing is good some days and bad some days   Dyspnea:  MMRC1 = can walk nl pace, flat grade, can't hurry or go uphills or steps s sob   -  only if overdoes it  Cough: none  Sleeping: flat bed 2 pillows SABA use: 2-3 x per week 02: 2lpm hs / not wearing prn daytime  Rec Plan A = Automatic = Always=    Trelegy one click 1st thing in am - take two good drags then rinse and gargle  Plan B = Backup (to supplement plan A, not to replace it) Only use your albuterol  inhaler as a rescue medication  Work on inhaler technique:   Please schedule a follow up visit in 6 months but call sooner if needed - first thing in AM and don't take any inhalers that am   Pacemaker Dec 13 2021 cone for CHB > doe improved     03/11/2023  f/u ov/[place] office/Rease Swinson re: GOLD 2 copd/ 02 hs only   maint on trelegy 100   Chief Complaint  Patient presents with   COPD GOLD III  Dyspnea:  no change MM1 - more than  5 steps a problem Cough: assoc with with pnds she gets q fall > better on zyrtec  Sleeping: bed is flat with 2 pillows s  resp cc  SABA use: none  02: 2lpm hs Rec For drainage / throat tickle try take CHLORPHENIRAMINE  4 mg F/u year   11/6/2025Yearly   f/u ov/[place] office/Remy Dia re: GOLD 2 copd/ 02 hs only maint on trlegy  100 Chief Complaint  Patient presents with   COPD    DOE   Dyspnea:  [employer] pushing cart fine slow pace whole store/ no HC  parking  Cough: none  Sleeping: flat bed/ 2 pilows s   resp cc  SABA use: once or twice daily  02: 2lpm hs  Lung cancer screening: per [facility]  11/14/23    No obvious day to day or daytime variability or assoc excess/ purulent sputum or mucus plugs or hemoptysis or cp or chest tightness, subjective wheeze or overt  hb symptoms.    Also denies any obvious fluctuation of symptoms with weather or environmental changes or other  aggravating or alleviating factors except as outlined above   No unusual exposure hx or h/o childhood pna/ asthma or knowledge of premature birth.  Current Allergies, Complete Past Medical History, Past Surgical History, Family History, and Social History were reviewed in [employer] record.  ROS  The following are not active complaints unless bolded Hoarseness, sore throat, dysphagia, dental problems, itching, sneezing,  nasal congestion or discharge of excess mucus or purulent secretions, ear ache,   fever, chills, sweats, unintended wt loss or wt gain, classically pleuritic or exertional cp,  orthopnea pnd or arm/hand swelling  or leg swelling, presyncope, palpitations, abdominal pain, anorexia, nausea, vomiting, diarrhea  or change in bowel habits or change in bladder habits, change in stools or change in urine, dysuria, hematuria,  rash, arthralgias, visual complaints, headache, numbness, weakness or ataxia or problems with walking or coordination,  change in mood or  memory.        Current Meds  Medication Sig   acetaminophen  (TYLENOL ) 325 MG tablet Take 650 mg by mouth every 6 (six) hours as needed for moderate pain.   albuterol  (VENTOLIN  HFA) 108 (90 Base) MCG/ACT inhaler Inhale 2 puffs into the lungs every 6 (six) hours as needed for wheezing.   amLODipine  (NORVASC ) 5 MG tablet TAKE 1 TABLET EVERY DAY   Ascorbic Acid  (VITAMIN C) 500 MG tablet Take 500 mg by mouth every other day.   aspirin  81 MG tablet Take 1 tablet (81 mg total) by mouth daily.   atorvastatin  (LIPITOR) 40 MG tablet Take 40 mg by mouth daily.   bismuth  subsalicylate (PEPTO BISMOL) 262 MG/15ML suspension Take 30 mLs by mouth every 6 (six) hours as needed for indigestion.   cetirizine (ZYRTEC) 10 MG tablet Take 10 mg by mouth daily.   Coral Calcium  1000 (390 CA) MG TABS Take 1,000 mg by mouth every other day.   fluticasone  (FLONASE ) 50 MCG/ACT nasal spray Place 1 spray into both nostrils daily as  needed for allergies.    Fluticasone -Umeclidin-Vilant (TRELEGY ELLIPTA ) 100-62.5-25 MCG/ACT AEPB INHALE 1 PUFF EVERY MORNING   Glycerin-Hypromellose-PEG 400 (DRY EYE RELIEF DROPS OP) Place 1 drop into both eyes every other day.   guaiFENesin  (MUCINEX ) 600 MG 12 hr tablet Take 600 mg by mouth as needed for to loosen phlegm.   Multiple Vitamin (MULTIVITAMIN) tablet Take 1 tablet by mouth every other day.   olmesartan -hydrochlorothiazide  (BENICAR  HCT)  40-12.5 MG tablet Take 1 tablet by mouth daily.   spironolactone  (ALDACTONE ) 25 MG tablet Take 25 mg by mouth 2 (two) times daily.            Past Medical History:  Diagnosis Date   Hyperlipidemia    Hypertension        Objective:     Wts  03/12/2024        178  03/11/2023        185  09/11/2022          180  03/12/2022        181  09/11/2021          176  03/06/2021      180   09/06/2020          168  07/28/20 164 lb (74.4 kg)  07/19/20 160 lb (72.6 kg)  05/12/20 156 lb 14.4 oz (71.2 kg)  01/26/2010       148     Vital signs reviewed  03/12/2024  - Note at rest 02 sats  95% on RA   General appearance:    jovial amb bf nad    HEENT : Oropharynx  clear   Nasal turbinates nl    NECK :  without  apparent JVD/ palpable Nodes/TM    LUNGS: no acc muscle use,  Min barrel  contour chest wall with bilateral  slightly decreased bs s audible wheeze and  without cough on insp or exp maneuvers and min  Hyperresonant  to  percussion bilaterally    CV:  RRR  no s3 or murmur or increase in P2, and no edema   ABD:  soft and nontender    MS:  Nl gait/ ext warm without deformities Or obvious joint restrictions  calf tenderness, cyanosis or clubbing     SKIN: warm and dry without lesions    NEURO:  alert, approp, nl sensorium with  no motor or cerebellar deficits apparent.             Assessment   Assessment & Plan COPD GOLD  2 Quit smoking 03/15/2020 - PFT's  03/15/20  FEV1 0.90 (44 % ) ratio 0.54  p 3 % improvement from saba p 0 prior  to study with DLCO  12.22 (84%) corrects to 3.59 (85%)  for alv volume and FV curve classic concave contour on exp portion   - 07/28/2020  After extensive coaching inhaler device,  effectiveness =    0 with hfa - 07/28/2020  After extensive coaching inhaler device,  effectiveness =    75% with elipta with short ti > try trelegy > improved 09/06/2020   - PFT's  1.13  FEV1 1.13  (59 % ) ratio 0.59  p 6 % improvement from saba p trelegy prior to study with DLCO  10.29 (59%)   and FV curve classically concave      Group E in terms of symptoms/risk so  laba/lama/ICS  therefore appropriate rx at this point >>>  trelegy   and approp SABA prn.  Re SABA :  I spent extra time with pt today reviewing appropriate use of albuterol  for prn use on exertion with the following points: 1) saba is for relief of sob that does not improve by walking a slower pace or resting but rather if the pt does not improve after trying this first. 2) If the pt is convinced, as many are, that saba helps recover from activity faster then it's easy to tell  if this is the case by re-challenging : ie stop, take the inhaler, then p 5 minutes try the exact same activity (intensity of workload) that just caused the symptoms and see if they are substantially diminished or not after saba 3) if there is an activity that reproducibly causes the symptoms, try the saba 15 min before the activity on alternate days   If in fact the saba really does help, then fine to continue to use it prn but advised may need to look closer at the maintenance regimen being used to achieve better control of airways disease with exertion.    Chronic respiratory failure with hypoxia and hypercapnia (HCC) As of surgery 04/11/20 02 2lpm  07/28/2020   Walked RA  approx   400 ft  @ mod fast pace  stopped due to  Sob/ sats 90%    -  09/06/2020   Walked RA  approx   400 ft  @ mod pace  stopped due to min sob with sats 90%  - 03/06/2021   Walked on RA x  3  lap(s) =  approx 450 @  moderate pace, stopped due to end of study, sob @ 2nd lap  with lowest 02 sats 92% - HC03  05/12/21     32   - ONO 12/21/21 RA  desat < 89% x 1h 8m > 12/29/2021 try 2lpm and repeat on 2lpm   - HC03  11/29/21  = 27  - 03/12/2022   Walked on RA  x  3  lap(s) =  approx 450  ft  @ moderate  pace, stopped due to end of study  with lowest 02 sats 92% and no sob   -  03/12/2024   Walked on RA  x  3  lap(s) =  approx 450  ft  @ mod pace, stopped due to end of study  with lowest 02 sats 89% and mild sob last lap    No need for 02 with activity at this point  as has learned to pace herself but have advised monitoring sats and let us  know if losing ground with doe or desats <89%    F/u yearly   Each maintenance medication was reviewed in detail including emphasizing most importantly the difference between maintenance and prns and under what circumstances the prns are to be triggered using an action plan format where appropriate.  Total time for H and P, chart review, counseling, reviewing dpi/ elipta and hfa / 02/ pulse ox device(s) , directly observing portions of ambulatory 02 saturation study/ and generating customized AVS unique to this office visit / same day charting =                AVS  Patient Instructions  No change in maintenance =  Trelegy 100  one click each and rinse mouth  Ok to try albuterol  15 min before an activity (on alternating days)  that you know would usually make you short of breath and see if it makes any difference and if makes none then don't take albuterol  after activity unless you can't catch your breath as this means it's the resting that helps, not the albuterol .  Please schedule a follow up visit in 12  months but call sooner if needed        Ozell America, MD 03/13/2024

## 2024-03-12 NOTE — Patient Instructions (Addendum)
 No change in maintenance =  Trelegy 100  one click each and rinse mouth  Ok to try albuterol  15 min before an activity (on alternating days)  that you know would usually make you short of breath and see if it makes any difference and if makes none then don't take albuterol  after activity unless you can't catch your breath as this means it's the resting that helps, not the albuterol .  Please schedule a follow up visit in 12  months but call sooner if needed

## 2024-03-13 NOTE — Progress Notes (Signed)
 Remote PPM Transmission

## 2024-03-13 NOTE — Assessment & Plan Note (Addendum)
 As of surgery 04/11/20 02 2lpm  07/28/2020   Walked RA  approx   400 ft  @ mod fast pace  stopped due to  Sob/ sats 90%    -  09/06/2020   Walked RA  approx   400 ft  @ mod pace  stopped due to min sob with sats 90%  - 03/06/2021   Walked on RA x  3  lap(s) =  approx 450 @ moderate pace, stopped due to end of study, sob @ 2nd lap  with lowest 02 sats 92% - HC03  05/12/21     32   - ONO 12/21/21 RA  desat < 89% x 1h 46m > 12/29/2021 try 2lpm and repeat on 2lpm   - HC03  11/29/21  = 27  - 03/12/2022   Walked on RA  x  3  lap(s) =  approx 450  ft  @ moderate  pace, stopped due to end of study  with lowest 02 sats 92% and no sob   -  03/12/2024   Walked on RA  x  3  lap(s) =  approx 450  ft  @ mod pace, stopped due to end of study  with lowest 02 sats 89% and mild sob last lap    No need for 02 with activity at this point  as has learned to pace herself but have advised monitoring sats and let us  know if losing ground with doe or desats <89%

## 2024-03-13 NOTE — Assessment & Plan Note (Addendum)
 Quit smoking 03/15/2020 - PFT's  03/15/20  FEV1 0.90 (44 % ) ratio 0.54  p 3 % improvement from saba p 0 prior to study with DLCO  12.22 (84%) corrects to 3.59 (85%)  for alv volume and FV curve classic concave contour on exp portion   - 07/28/2020  After extensive coaching inhaler device,  effectiveness =    0 with hfa - 07/28/2020  After extensive coaching inhaler device,  effectiveness =    75% with elipta with short ti > try trelegy > improved 09/06/2020   - PFT's  1.13  FEV1 1.13  (59 % ) ratio 0.59  p 6 % improvement from saba p trelegy prior to study with DLCO  10.29 (59%)   and FV curve classically concave      Group E in terms of symptoms/risk so  laba/lama/ICS  therefore appropriate rx at this point >>>  trelegy   and approp SABA prn.  Re SABA :  I spent extra time with pt today reviewing appropriate use of albuterol  for prn use on exertion with the following points: 1) saba is for relief of sob that does not improve by walking a slower pace or resting but rather if the pt does not improve after trying this first. 2) If the pt is convinced, as many are, that saba helps recover from activity faster then it's easy to tell if this is the case by re-challenging : ie stop, take the inhaler, then p 5 minutes try the exact same activity (intensity of workload) that just caused the symptoms and see if they are substantially diminished or not after saba 3) if there is an activity that reproducibly causes the symptoms, try the saba 15 min before the activity on alternate days   If in fact the saba really does help, then fine to continue to use it prn but advised may need to look closer at the maintenance regimen being used to achieve better control of airways disease with exertion.

## 2024-03-17 ENCOUNTER — Ambulatory Visit: Payer: Self-pay | Admitting: Cardiology

## 2024-03-24 NOTE — Progress Notes (Unsigned)
  Electrophysiology Office Note:   Date:  03/26/2024  ID:  Yolanda Bauer, DOB Sep 29, 1945, MRN [id number]  Primary Cardiologist: Jayson Sierras, MD Primary Heart Failure: None Electrophysiologist: Will Gladis Norton, MD       History of Present Illness:   Yolanda Bauer is a 78 y.o. 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   Since last being seen in our clinic the patient reports doing well. She is working out her Thanksgiving plans with her family and plans to bake.  She has no device specific concerns.     She denies chest pain, palpitations, dyspnea, PND, orthopnea, nausea, vomiting, dizziness, syncope, edema, weight gain, or early satiety.   Review of systems complete and found to be negative unless listed in HPI.    EP Information / Studies Reviewed:    EKG is ordered today. Personal review as below.  EKG Interpretation Date/Time:  Thursday March 26 2024 09:43:31 EST Ventricular Rate:  79 PR Interval:  172 QRS Duration:  130 QT Interval:  406 QTC Calculation: 465 R Axis:   56  Text Interpretation: Atrial-sensed ventricular-paced rhythm Confirmed by Aniceto Jarvis ([id number]) on 03/26/2024 9:49:08 AM   PPM Interrogation-  reviewed in detail today,  See PACEART report.  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 for Second Degree AV block / intermittent CHB  Risk Assessment/Calculations:              Physical Exam:   VS:  BP 132/70 (BP Location: Left Arm, Patient Position: Sitting, Cuff Size: Normal)   Pulse 79   Ht 5' 1 (1.549 m)   BMI 33.63 kg/m    Wt Readings from Last 3 Encounters:  03/12/24 178 lb (80.7 kg)  12/12/23 178 lb 3.2 oz (80.8 kg)  11/19/23 177 lb (80.3 kg)     GEN: Well nourished, well developed in no acute distress NECK: No JVD; No carotid bruits CARDIAC: Regular rate and rhythm, no murmurs, rubs, gallops,no device tethering,  site wnl  RESPIRATORY:  Clear to auscultation without rales, wheezing or rhonchi  ABDOMEN: Soft, non-tender, non-distended EXTREMITIES:  No edema; No deformity   ASSESSMENT AND PLAN:    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 Norton in 12 months  Signed, Jarvis Aniceto, NP-C, AGACNP-BC [facility] - Electrophysiology  03/26/2024, 9:49 AM

## 2024-03-26 ENCOUNTER — Ambulatory Visit: Attending: Pulmonary Disease | Admitting: Pulmonary Disease

## 2024-03-26 ENCOUNTER — Encounter: Payer: Self-pay | Admitting: Pulmonary Disease

## 2024-03-26 VITALS — BP 132/70 | HR 79 | Ht 61.0 in

## 2024-03-26 DIAGNOSIS — I442 Atrioventricular block, complete: Secondary | ICD-10-CM

## 2024-03-26 DIAGNOSIS — I10 Essential (primary) hypertension: Secondary | ICD-10-CM

## 2024-03-26 DIAGNOSIS — Z950 Presence of cardiac pacemaker: Secondary | ICD-10-CM

## 2024-03-26 DIAGNOSIS — I441 Atrioventricular block, second degree: Secondary | ICD-10-CM

## 2024-03-26 LAB — CUP PACEART INCLINIC DEVICE CHECK
Date Time Interrogation Session: 20251120101658
Implantable Lead Connection Status: 753985
Implantable Lead Connection Status: 753985
Implantable Lead Implant Date: 20230809
Implantable Lead Implant Date: 20230809
Implantable Lead Location: 753859
Implantable Lead Location: 753860
Implantable Lead Model: 3830
Implantable Lead Model: 5076
Implantable Pulse Generator Implant Date: 20230809

## 2024-03-26 NOTE — Patient Instructions (Addendum)
 Medication Instructions:  NO CHANGES  Lab Work: NONE TO BE DONE TODAY.  Testing/Procedures: NONE  Follow-Up: At [facility], you and your health needs are our priority.  As part of our continuing mission to provide you with exceptional heart care, our providers are all part of one team.  This team includes your primary Cardiologist (physician) and Advanced Practice Providers or APPs (Physician Assistants and Nurse Practitioners) who all work together to provide you with the care you need, when you need it.  Your next appointment:   1 YEAR  Provider:   Jayson Sierras, MD OR Daphne Barrack, NP

## 2024-03-29 ENCOUNTER — Ambulatory Visit: Payer: Self-pay | Admitting: Cardiology

## 2024-06-11 ENCOUNTER — Telehealth: Payer: Self-pay

## 2024-06-11 NOTE — Telephone Encounter (Signed)
 Spoke with patient in regards to her Medicare card. She was advised to bring in her card at her upcoming appointment with us .  Patient voiced understanding.

## 2024-07-30 ENCOUNTER — Ambulatory Visit: Payer: Medicare (Managed Care) | Admitting: Internal Medicine

## 2024-11-09 ENCOUNTER — Other Ambulatory Visit

## 2024-11-16 ENCOUNTER — Ambulatory Visit: Admitting: Internal Medicine
